# Patient Record
Sex: Female | Born: 1940 | Race: White | Hispanic: No | Marital: Married | Smoking: Never smoker
Health system: Southern US, Community
[De-identification: ages and names within clinical notes are randomized; demographics above are authoritative.]

## PROBLEM LIST (undated history)

## (undated) DIAGNOSIS — K621 Rectal polyp: Secondary | ICD-10-CM

## (undated) DIAGNOSIS — I219 Acute myocardial infarction, unspecified: Secondary | ICD-10-CM

## (undated) DIAGNOSIS — K219 Gastro-esophageal reflux disease without esophagitis: Secondary | ICD-10-CM

## (undated) DIAGNOSIS — I712 Thoracic aortic aneurysm, without rupture: Secondary | ICD-10-CM

## (undated) DIAGNOSIS — R531 Weakness: Secondary | ICD-10-CM

## (undated) DIAGNOSIS — I7121 Aneurysm of the ascending aorta, without rupture: Secondary | ICD-10-CM

## (undated) DIAGNOSIS — R112 Nausea with vomiting, unspecified: Secondary | ICD-10-CM

## (undated) DIAGNOSIS — I2510 Atherosclerotic heart disease of native coronary artery without angina pectoris: Secondary | ICD-10-CM

## (undated) DIAGNOSIS — E785 Hyperlipidemia, unspecified: Secondary | ICD-10-CM

## (undated) DIAGNOSIS — I10 Essential (primary) hypertension: Secondary | ICD-10-CM

## (undated) DIAGNOSIS — I8390 Asymptomatic varicose veins of unspecified lower extremity: Secondary | ICD-10-CM

## (undated) DIAGNOSIS — R5383 Other fatigue: Secondary | ICD-10-CM

## (undated) DIAGNOSIS — Z9889 Other specified postprocedural states: Secondary | ICD-10-CM

## (undated) DIAGNOSIS — J45909 Unspecified asthma, uncomplicated: Secondary | ICD-10-CM

## (undated) HISTORY — DX: Other fatigue: R53.83

## (undated) HISTORY — PX: OTHER SURGICAL HISTORY: SHX169

## (undated) HISTORY — DX: Rectal polyp: K62.1

## (undated) HISTORY — DX: Hyperlipidemia, unspecified: E78.5

## (undated) HISTORY — DX: Atherosclerotic heart disease of native coronary artery without angina pectoris: I25.10

## (undated) HISTORY — PX: CATARACT EXTRACTION: SUR2

## (undated) HISTORY — PX: CORONARY STENT PLACEMENT: SHX1402

## (undated) HISTORY — DX: Thoracic aortic aneurysm, without rupture: I71.2

## (undated) HISTORY — DX: Essential (primary) hypertension: I10

## (undated) HISTORY — DX: Gastro-esophageal reflux disease without esophagitis: K21.9

## (undated) HISTORY — DX: Acute myocardial infarction, unspecified: I21.9

## (undated) HISTORY — PX: SPINE SURGERY: SHX786

## (undated) HISTORY — DX: Aneurysm of the ascending aorta, without rupture: I71.21

## (undated) HISTORY — DX: Asymptomatic varicose veins of unspecified lower extremity: I83.90

## (undated) HISTORY — DX: Weakness: R53.1

## (undated) HISTORY — PX: LEG SURGERY: SHX1003

---

## 1994-02-24 HISTORY — PX: ABDOMINAL HYSTERECTOMY: SHX81

## 1998-02-24 HISTORY — PX: ORIF TIBIA & FIBULA FRACTURES: SHX2131

## 2006-06-05 ENCOUNTER — Encounter: Admission: RE | Admit: 2006-06-05 | Discharge: 2006-06-05 | Payer: Self-pay | Admitting: Unknown Physician Specialty

## 2007-08-03 ENCOUNTER — Encounter: Admission: RE | Admit: 2007-08-03 | Discharge: 2007-08-03 | Payer: Self-pay | Admitting: Unknown Physician Specialty

## 2007-08-10 ENCOUNTER — Encounter: Admission: RE | Admit: 2007-08-10 | Discharge: 2007-08-10 | Payer: Self-pay | Admitting: Unknown Physician Specialty

## 2008-09-07 ENCOUNTER — Encounter: Admission: RE | Admit: 2008-09-07 | Discharge: 2008-09-07 | Payer: Self-pay | Admitting: Unknown Physician Specialty

## 2009-09-17 ENCOUNTER — Encounter: Admission: RE | Admit: 2009-09-17 | Discharge: 2009-09-17 | Payer: Self-pay | Admitting: Obstetrics and Gynecology

## 2009-10-03 ENCOUNTER — Encounter: Admission: RE | Admit: 2009-10-03 | Discharge: 2009-10-03 | Payer: Self-pay | Admitting: Cardiology

## 2009-10-03 ENCOUNTER — Ambulatory Visit: Payer: Self-pay | Admitting: Cardiology

## 2009-10-12 ENCOUNTER — Ambulatory Visit (HOSPITAL_COMMUNITY): Admission: RE | Admit: 2009-10-12 | Discharge: 2009-10-12 | Payer: Self-pay | Admitting: Cardiology

## 2009-10-12 ENCOUNTER — Ambulatory Visit: Payer: Self-pay | Admitting: Cardiology

## 2010-03-14 ENCOUNTER — Ambulatory Visit: Payer: Self-pay | Admitting: Cardiology

## 2010-03-21 ENCOUNTER — Telehealth (INDEPENDENT_AMBULATORY_CARE_PROVIDER_SITE_OTHER): Payer: Self-pay | Admitting: *Deleted

## 2010-03-25 ENCOUNTER — Other Ambulatory Visit: Payer: Self-pay | Admitting: Physician Assistant

## 2010-03-25 ENCOUNTER — Encounter: Payer: Self-pay | Admitting: Cardiology

## 2010-03-25 ENCOUNTER — Ambulatory Visit: Admission: RE | Admit: 2010-03-25 | Discharge: 2010-03-25 | Payer: Self-pay | Source: Home / Self Care

## 2010-03-25 ENCOUNTER — Ambulatory Visit
Admission: RE | Admit: 2010-03-25 | Discharge: 2010-03-25 | Payer: Self-pay | Source: Home / Self Care | Attending: Cardiology | Admitting: Cardiology

## 2010-03-25 ENCOUNTER — Encounter (HOSPITAL_COMMUNITY)
Admission: RE | Admit: 2010-03-25 | Discharge: 2010-03-26 | Payer: Self-pay | Source: Home / Self Care | Attending: Cardiology | Admitting: Cardiology

## 2010-03-25 ENCOUNTER — Encounter: Payer: Self-pay | Admitting: Physician Assistant

## 2010-03-25 DIAGNOSIS — I10 Essential (primary) hypertension: Secondary | ICD-10-CM | POA: Insufficient documentation

## 2010-03-25 DIAGNOSIS — R079 Chest pain, unspecified: Secondary | ICD-10-CM | POA: Insufficient documentation

## 2010-03-25 DIAGNOSIS — J45909 Unspecified asthma, uncomplicated: Secondary | ICD-10-CM | POA: Insufficient documentation

## 2010-03-25 DIAGNOSIS — R9430 Abnormal result of cardiovascular function study, unspecified: Secondary | ICD-10-CM | POA: Insufficient documentation

## 2010-03-25 DIAGNOSIS — E785 Hyperlipidemia, unspecified: Secondary | ICD-10-CM | POA: Insufficient documentation

## 2010-03-25 LAB — PROTIME-INR: Prothrombin Time: 12.3 s (ref 10.2–12.4)

## 2010-03-25 LAB — CBC WITH DIFFERENTIAL/PLATELET
Basophils Absolute: 0 10*3/uL (ref 0.0–0.1)
Basophils Relative: 0.5 % (ref 0.0–3.0)
Eosinophils Absolute: 0.1 10*3/uL (ref 0.0–0.7)
HCT: 40.5 % (ref 36.0–46.0)
Hemoglobin: 13.8 g/dL (ref 12.0–15.0)
MCV: 91.3 fl (ref 78.0–100.0)
Neutrophils Relative %: 62.1 % (ref 43.0–77.0)
RBC: 4.43 Mil/uL (ref 3.87–5.11)
RDW: 13 % (ref 11.5–14.6)
WBC: 5.7 10*3/uL (ref 4.5–10.5)

## 2010-03-25 LAB — BASIC METABOLIC PANEL
BUN: 10 mg/dL (ref 6–23)
Calcium: 9.7 mg/dL (ref 8.4–10.5)
Chloride: 105 mEq/L (ref 96–112)
Creatinine, Ser: 0.6 mg/dL (ref 0.4–1.2)

## 2010-03-26 ENCOUNTER — Encounter: Payer: Self-pay | Admitting: Cardiology

## 2010-03-27 ENCOUNTER — Ambulatory Visit (HOSPITAL_COMMUNITY)
Admission: RE | Admit: 2010-03-27 | Discharge: 2010-03-27 | Disposition: A | Discharge status: Still In Hospital | Payer: Medicare Other | Source: Ambulatory Visit | Attending: Cardiovascular Disease | Admitting: Cardiovascular Disease

## 2010-03-27 ENCOUNTER — Ambulatory Visit (HOSPITAL_COMMUNITY)
Admission: RE | Admit: 2010-03-27 | Discharge: 2010-03-27 | Disposition: A | Discharge status: Nursing Home (Includes ICF) | Payer: Medicare Other | Source: Ambulatory Visit | Attending: Cardiovascular Disease | Admitting: Cardiovascular Disease

## 2010-03-27 ENCOUNTER — Inpatient Hospital Stay (HOSPITAL_COMMUNITY)
Admission: AD | Admit: 2010-03-27 | Discharge: 2010-03-30 | DRG: 247 | Disposition: A | Discharge status: Home / Self Care | Payer: Medicare Other | Source: Ambulatory Visit | Attending: Cardiovascular Disease | Admitting: Cardiovascular Disease

## 2010-03-27 DIAGNOSIS — Z7982 Long term (current) use of aspirin: Secondary | ICD-10-CM

## 2010-03-27 DIAGNOSIS — E7800 Pure hypercholesterolemia, unspecified: Secondary | ICD-10-CM | POA: Insufficient documentation

## 2010-03-27 DIAGNOSIS — J45909 Unspecified asthma, uncomplicated: Secondary | ICD-10-CM | POA: Diagnosis present

## 2010-03-27 DIAGNOSIS — I10 Essential (primary) hypertension: Secondary | ICD-10-CM | POA: Diagnosis present

## 2010-03-27 DIAGNOSIS — I2510 Atherosclerotic heart disease of native coronary artery without angina pectoris: Secondary | ICD-10-CM

## 2010-03-27 DIAGNOSIS — E785 Hyperlipidemia, unspecified: Secondary | ICD-10-CM | POA: Insufficient documentation

## 2010-03-27 DIAGNOSIS — R0602 Shortness of breath: Secondary | ICD-10-CM | POA: Insufficient documentation

## 2010-03-27 DIAGNOSIS — Z7902 Long term (current) use of antithrombotics/antiplatelets: Secondary | ICD-10-CM

## 2010-03-27 DIAGNOSIS — R079 Chest pain, unspecified: Secondary | ICD-10-CM | POA: Insufficient documentation

## 2010-03-27 HISTORY — PX: CORONARY ANGIOPLASTY: SHX604

## 2010-03-28 LAB — CBC
HCT: 42.7 % (ref 36.0–46.0)
MCH: 29.1 pg (ref 26.0–34.0)
MCV: 89.9 fL (ref 78.0–100.0)
Platelets: 283 10*3/uL (ref 150–400)
RBC: 4.75 MIL/uL (ref 3.87–5.11)
WBC: 7.2 10*3/uL (ref 4.0–10.5)

## 2010-03-28 LAB — BASIC METABOLIC PANEL: Creatinine, Ser: 0.65 mg/dL (ref 0.4–1.2)

## 2010-03-28 NOTE — Progress Notes (Addendum)
Summary: Nuclear pre procedure  Phone Note Outgoing Call Call back at Home Phone [phone]   Summary of Call: Reviewed information on Myoview Information Sheet (see scanned document for further details).  Lindsey Middleton spoke with patient.      Nuclear Med Background Indications for Stress Test: Evaluation for Ischemia   History: Asthma, Echo, Heart Catheterization  History Comments: '86 Cath:"minor blockage" per patient; 8/11 Echo:mild LVH  Symptoms: Chest Tightness, Dizziness, SOB    Nuclear Pre-Procedure Cardiac Risk Factors: Family History - CAD

## 2010-03-29 LAB — BASIC METABOLIC PANEL
BUN: 11 mg/dL (ref 6–23)
CO2: 25 mEq/L (ref 19–32)
Calcium: 9.6 mg/dL (ref 8.4–10.5)
GFR calc non Af Amer: >60 mL/min (ref >60)
Glucose, Bld: 113 mg/dL — ABNORMAL HIGH (ref 70–99)
Potassium: 4.4 mEq/L (ref 3.5–5.1)
Sodium: 142 mEq/L (ref 135–145)

## 2010-03-29 LAB — CBC
HCT: 38.7 % (ref 36.0–46.0)
MCHC: 32.3 g/dL (ref 30.0–36.0)
RBC: 4.24 MIL/uL (ref 3.87–5.11)

## 2010-03-30 LAB — BASIC METABOLIC PANEL
BUN: 9 mg/dL (ref 6–23)
CO2: 25 mEq/L (ref 19–32)
Calcium: 9.6 mg/dL (ref 8.4–10.5)
Chloride: 111 mEq/L (ref 96–112)
GFR calc Af Amer: >60 mL/min (ref >60)
GFR calc non Af Amer: >60 mL/min (ref >60)
Glucose, Bld: 111 mg/dL — ABNORMAL HIGH (ref 70–99)
Potassium: 4.2 mEq/L (ref 3.5–5.1)
Sodium: 142 mEq/L (ref 135–145)

## 2010-03-30 LAB — CBC
HCT: 36 % (ref 36.0–46.0)
MCHC: 31.9 g/dL (ref 30.0–36.0)
Platelets: 227 10*3/uL (ref 150–400)
RBC: 3.92 MIL/uL (ref 3.87–5.11)
RDW: 12.6 % (ref 11.5–15.5)
WBC: 5.2 10*3/uL (ref 4.0–10.5)

## 2010-03-30 NOTE — Cardiovascular Report (Addendum)
NAME:  Lindsey Middleton, Lindsey Middleton NO.:  [id number]  MEDICAL RECORD NO.:  [id number]           PATIENT TYPE:  O  LOCATION:  [facility]                         FACILITY:  [facility]  PHYSICIAN:  Verne Carrow, MDDATE OF BIRTH:  15-Oct-1940  DATE OF PROCEDURE:  03/27/2010 DATE OF DISCHARGE:  03/27/2010                           CARDIAC CATHETERIZATION   PRIMARY CARDIOLOGIST:  Cassell Clement, MD  PRIMARY CARE PHYSICIAN:  Ihor Austin. Bufford Buttner, MD at [place], [place]  PROCEDURE PERFORMED: 1. Left heart catheterization. 2. Selective coronary angiography. 3. Left ventricular angiogram.  OPERATOR:  Verne Carrow, MD  INDICATIONS:  This is a 70 year old Caucasian female with a history of hypertension, hyperlipidemia, and nonobstructive coronary artery disease by catheterization in 1986 who has had recent complaints of shortness of breath and chest pain.  She underwent a stress Myoview in the office on March 25, 2010. She complained of chest pain and shortness of breath during the test.  Her images showed apical ischemia with ejection fraction of 63%.  She was seen by Tereso Newcomer, physician's assistant, and the diagnostic catheterization was arranged for today.  DETAILS OF PROCEDURE:  The patient was brought to the outpatient cardiac catheterization laboratory after signing informed consent for the procedure.  The right groin was prepped and draped in sterile fashion. Lidocaine 1% was used for local anesthesia.  A 4-French sheath was inserted into the right femoral artery without difficulty.  Standard diagnostic catheters were used to perform selective coronary angiography.  A pigtail catheter was used to perform a left ventricular angiogram.  The patient tolerated the procedure well.  She was taken to the recovery area with the 4-French sheath in place.  There were plans for the patient to be moved upstairs for percutaneous intervention of the severe stenosis in the  right coronary artery.  The patient was given 4000 units of intravenous heparin.  She was also loaded with Effient 60 mg x1.  HEMODYNAMIC FINDINGS:  Central aortic pressure 131/74.  Left ventricular pressure 129/14.  Left ventricular end-diastolic pressure 17.  ANGIOGRAPHIC FINDINGS: 1. The left main coronary artery had no obstructive disease. 2. 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  Following the     aneurysmal segment, the LAD had a diffuse 40% stenosis throughout     the midsegment.  The distal vessel had mild plaque disease.  There     was a small-caliber diagonal branch with no obstructive disease. 3. 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  There was mild plaque in the proximal     and midsegment.  The first second and third obtuse marginal     branches were small in caliber and no obstructive disease. 4. The right coronary artery was a large dominant vessel with 30%     proximal stenosis and a 95% discrete mid stenosis. 5. Left ventricular angiogram was performed in the RAO projection that     showed normal left ventricular systolic function with ejection     fraction  of 65%.  IMPRESSION: 1. Single-vessel coronary artery disease. 2. Normal left ventricular systolic function.  RECOMMENDATIONS:  The patient will be loaded with 60 mg of Effient x1 now.  We will plan on performing intervention of her right coronary artery with balloon angioplasty and stent placement in the main cath lab later this morning.     Verne Carrow, MD     CM/MEDQ  D:  03/27/2010  T:  03/28/2010  Job:  [id number]  cc:   Cassell Clement, M.D. Ihor Austin. Bufford Buttner, MD  Electronically Signed by Verne Carrow MD on 03/28/2010 12:02:03 PM

## 2010-03-30 NOTE — Cardiovascular Report (Addendum)
NAME:  Lindsey Middleton, Lindsey Middleton NO.:  [id number]  MEDICAL RECORD NO.:  [id number]           PATIENT TYPE:  O  LOCATION:  [facility]                         FACILITY:  [facility]  PHYSICIAN:  Verne Carrow, MDDATE OF BIRTH:  [birth year]  DATE OF PROCEDURE:  03/27/2010 DATE OF DISCHARGE:  03/27/2010                           CARDIAC CATHETERIZATION   PRIMARY CARDIOLOGIST:  Cassell Clement, MD  PRIMARY CARE PHYSICIAN:  Ihor Austin. Bufford Buttner, MD, in [place], [place].  PROCEDURE PERFORMED:  Percutaneous transluminal coronary angioplasty of the mid right coronary artery.  OPERATOR:  Verne Carrow, MD  INDICATION:  This is a 70 year old Caucasian female who has had recent episodes of chest pain and an abnormal Myoview.  She was found during left heart catheterization this morning in the outpatient cath lab to have a severe stenosis in the mid right coronary artery.  Percutaneous coronary intervention was staged for this afternoon.  DETAILS OF PROCEDURE:  When the patient arrived in the main cardiac catheterization laboratory, she had a 4-French sheath present in the right femoral artery.  She was placed flat on the cath table.  The right groin was prepped and draped in sterile fashion.  Additional 1% lidocaine was used around the sheath for local anesthesia.  A 6-French sheath was inserted into the right femoral artery during a catheter exchange with the wire in place.  The patient was given a bolus of Angiomax and a drip was started.  A 6-French JR-4 guiding catheter was used to selectively engage the native right coronary artery.  When ACT was greater than 200, we passed a Cougar intracoronary wire down the length of the right coronary artery.  We initially used a 2.5 x 12-mm balloon to try to predilate the lesion; however, the lesion did not respond.  We then used a series of balloons starting next with a 2.5 x 10-mm cutting balloon.  Next, a 2.75 x 12-mm noncompliant  balloon was used.  The lesion did not respond to any of these inflations.  We were unable to cross the lesion with a 3.0 x 10-mm cutting balloon.  We were able to get a 3.0 x 10 mm AngioSculpt cutting balloon across the lesion and inflated this to 14 atmospheres with no response of the lesion.  The lesion would not yield secondary to calcification and the fibrotic nature of the lesion.  We did not induce any vessel tears or dissections.  I felt that the best plan of action at this time would be to stop and bring her back in several days after vessel healing for Rotablator atherectomy.  The guiding catheter was removed without difficulty.  The patient had no complaints.  There were no complications.  IMPRESSION: 1. Single-vessel coronary artery disease. 2. Unsuccessful percutaneous transluminal coronary angioplasty of the     mid right coronary artery.  RECOMMENDATIONS:  We will continue aspirin, Effient, beta-blocker, and statin.  We will keep the patient in the hospital for 48 hours and we will plan staging the percutaneous coronary intervention of the mid right coronary artery with a Rotablator atherectomy on Friday, March 29, 2010.  Hopefully, we will  be able to alter the calcification on the vessel with a Rotablator and then will be able to place a stent successfully.     Verne Carrow, MD     CM/MEDQ  D:  03/27/2010  T:  03/28/2010  Job:  [id number]  cc:   Cassell Clement, M.D. Ihor Austin. Bufford Buttner, MD  Electronically Signed by Verne Carrow MD on 03/28/2010 12:02:06 PM

## 2010-04-03 NOTE — Miscellaneous (Signed)
Summary: Orders Update  Clinical Lists Changes  Orders: Added new Test order of TLB-BMP (Basic Metabolic Panel-BMET) (80048-METABOL) - Signed Added new Test order of TLB-CBC Platelet - w/Differential (85025-CBCD) - Signed Added new Test order of TLB-PTT (85730-PTTL) - Signed Added new Test order of TLB-PT (Protime) (85610-PTP) - Signed 

## 2010-04-03 NOTE — Miscellaneous (Signed)
Summary: Orders Update  Clinical Lists Changes  Orders: Added new Test order of TLB-BMP (Basic Metabolic Panel-BMET) (80048-METABOL) - Signed Added new Test order of TLB-CBC Platelet - w/Differential (85025-CBCD) - Signed Added new Test order of TLB-PTT (85730-PTTL) - Signed Added new Test order of TLB-PT (Protime) (85610-PTP) - Signed

## 2010-04-03 NOTE — Assessment & Plan Note (Addendum)
Summary: [facility]   Visit Type:  add on from nuc Primary Provider:  Dr. Lorenza Evangelist in [place], [place]  CC:  sob, chest pain, and .  History of Present Illness: Primary Cardiologist:  Dr. Cassell Clement  Lindsey Middleton is a 70 yo female recently evaluated by Dr. Patty Sermons for chest pain.  Ramipril and Toprol were increased for her BP.  She had a stress myoview in the office today and complained of chest pain and shortness of breath during the test.  Her images demonstrated apical ischemia and EF 63%.  She is added on to my schedule for evaluation.  She has had chest discomfort that she describes as a pressure the last couple of months.  It has been worse for the last couple of weeks.  She was to have a stress test back in the summer time, but had to reschedule.  She has chest discomfort with exertion.  It typically gets better with rest.  She notes significant symptoms with vacuuming her house.  She has associated left arm pain, diaphoresis and nausea.  She sleeps on 2 pillows.  This is chronic without change.  She denies PND.  She denies pedal edema.  She states her husband has witnessed her stopping breathing while she sleeps.  She also admits to daytime hypersomnolence.  She is currently not having any chest symptoms at rest.  Prior to today, the last time she had chest discomfort was last week when she overexerted herself.  Her images have not officially been read.  However, they were reviewed by Dr. Antoine Poche.  She has evidence of apical ischemia.  Preventive Screening-Counseling & Management  Alcohol-Tobacco     Smoking Status: never  Current Medications (verified): 1)  Lisinopril 10 Mg Tabs (Lisinopril) .... Once Daily 2)  Metoprolol Succinate 50 Mg Xr24h-Tab (Metoprolol Succinate) .... Take 1 Tablet By Mouth Once A Day 3)  Crestor 40 Mg Tabs (Rosuvastatin Calcium) .... Take 1 Tablet By Mouth Once A Day 4)  Tricor 145 Mg Tabs (Fenofibrate) .... Take 1 Tablet By Mouth Once A  Day 5)  Advair Diskus 100-50 Mcg/dose Aepb (Fluticasone-Salmeterol) .... 2 Puffs Every Morning 6)  Proventil Hfa 108 (90 Base) Mcg/act Aers (Albuterol Sulfate) .... As Needed 7)  Singulair 10 Mg Tabs (Montelukast Sodium) .... Take 1 Tablet By Mouth Once A Day 8)  Allegra Allergy 60 Mg Tabs (Fexofenadine Hcl) .... Once Daily 9)  Aspirin 81 Mg Tbec (Aspirin) .... Take One Tablet By Mouth Daily  Allergies (verified): 1)  ! 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  Alcohol Use - no Smoking Status:  never  Review of Systems       As per  the HPI.  All other systems reviewed and negative.   Vital Signs:  Patient profile:   70 year old female Height:      66 inches Weight:      176 pounds BMI:     28.51 Pulse rate:   84 /  minute BP sitting:   142 / 94  (left arm)  Vitals Entered By: Celestia Khat, CMA (March 25, 2010 1:45 PM)  Physical Exam  General:  Well nourished, well developed in no acute distress HEENT: Normal Neck: No JVD Cardiac:  Normal S1, S2; RRR; no murmur Lungs:  Clear to auscultation bilaterally, no wheezing, rhonchi or rales Abd: Soft, nontender, no hepatomegaly Ext: No clubbing, cyanosis or edema Vascular: No carotid  bruits; Femoral arteries 2+ bilaterally without bruits Skin: Warm and dry MSK:  No deformities Lymph: No adenopathy Endocrine:  No thyromegaly Neuro: CNs 2-12 intact; nonfocal    Impression & Recommendations:  Problem # 1:  CHEST PAIN  UNSPECIFIED (ICD-786.50) The patient requires cardiac catheterization.  Risks and benefits have been discussed with the patient and available family members.  Risks include but are not limited to death, heart attack, stroke, bleeding, infection or allergic reaction from the dye.  The patient is willing to accept these risks and proceed with catheterization. She will continue on ASA and beta blocker.  She has NTG.  She knows to go to the ED if she has unstable symptoms. I discussed her case with Dr. Antoine Poche.  She will be set up in the [facility] lab.  Problem # 2:  NONSPECIFIC ABNORMAL UNSPEC CV FUNCTION STUDY (ICD-794.30) As above.  Orders: EKG w/ Interpretation (93000)  Problem # 3:  HYPERTENSION (ICD-401.9) Continue ACE and beta blocker.  She has not had Toprol yet today.  Problem # 4:  HYPERLIPIDEMIA (ICD-272.4) Followed by PCP.  Problem # 5:  Apnea She should be worked up for sleep apnea after her cardiac workup is complete.

## 2010-04-03 NOTE — Assessment & Plan Note (Signed)
Summary: Cardiology Nuclear Testing  Nuclear Med Background Indications for Stress Test: Evaluation for Ischemia   History: Asthma, Echo, Heart Catheterization  History Comments: '86 Cath:"minor blockage" per patient; 8/11 Echo:mild LVH  Symptoms: Chest Tightness, Dizziness, Palpitations, SOB    Nuclear Pre-Procedure Cardiac Risk Factors: Family History - CAD Caffeine/Decaff Intake: None NPO After: 8:00 PM Lungs: clear IV 0.9% NS with Angio Cath: 20g     IV Site: R Antecubital IV Started by: Irean Hong, RN Chest Size (in) 34     Cup Size D     Height (in): 66 Weight (lb): 178 BMI: 28.83 Tech Comments: Held toprol x 24 hrs.  This patient became very SOB and fatigued when she walked only 3:15 on the treadmill. In recovery, about 10 minutes out she became sob again and also became clammy. BP 142/104.  Nuclear Med Study 1 or 2 day study:  1 day     Stress Test Type:  Stress Reading MD:  Cassell Clement, MD     Referring MD:  T.Brackbill Resting Radionuclide:  Technetium 23m Tetrofosmin     Resting Radionuclide Dose:  11 mCi  Stress Radionuclide:  Technetium 77m Tetrofosmin     Stress Radionuclide Dose:  33 mCi   Stress Protocol Exercise Time (min):  3:15 min     Max HR:  139 bpm     Predicted Max HR:  151 bpm  Max Systolic BP: 145 mm Hg     Percent Max HR:  92.05 %     METS: 4.90 Rate Pressure Product:  04540    Stress Test Technologist:  Milana Na, EMT-P     Nuclear Technologist:  Domenic Polite, CNMT  Rest Procedure  Myocardial perfusion imaging was performed at rest 45 minutes following the intravenous administration of Technetium 82m Tetrofosmin.  Stress Procedure  The patient exercised for 3:15. The patient stopped due to fatigue, sob, and chest tightness.  There were non specific ST-T wave changes and a rare pvcs..  Technetium 31m Tetrofosmin was injected at peak exercise and myocardial perfusion imaging was performed after a brief delay.  QPS Raw Data  Images:  Normal; no motion artifact; normal heart/lung ratio. Stress Images:  Small apical perfusion defect.  Rest Images:  Normal homogeneous uptake in all areas of the myocardium. Subtraction (SDS):  Small reversible apical perfusion defect.  Transient Ischemic Dilatation:  1.0  (Normal <1.22)  Lung/Heart Ratio:  .28  (Normal <0.45)  Quantitative Gated Spect Images QGS EDV:  80 ml QGS ESV:  30 ml QGS EF:  63 % QGS cine images:  Normal wall motion.    Overall Impression  Exercise Capacity: Poor exercise capacity. BP Response: Normal blood pressure response. Clinical Symptoms: Short of breath, chest tightness ECG Impression: No significant ST segment change suggestive of ischemia. Overall Impression: Small reversible perfusion defect at apex suggests possible apical ischemia.  Overall low risk study.  Overall Impression Comments: Poor exercise tolerance.   Appended Document: Cardiology Nuclear Testing COPY SENT TO [facility]

## 2010-04-04 ENCOUNTER — Ambulatory Visit (INDEPENDENT_AMBULATORY_CARE_PROVIDER_SITE_OTHER): Payer: Medicare Other | Admitting: Cardiology

## 2010-04-04 DIAGNOSIS — R42 Dizziness and giddiness: Secondary | ICD-10-CM

## 2010-04-04 DIAGNOSIS — R079 Chest pain, unspecified: Secondary | ICD-10-CM

## 2010-04-06 NOTE — Procedures (Signed)
NAME:  Lindsey Middleton, NABOR NO.:  [id number]  MEDICAL RECORD NO.:  [id number]           PATIENT TYPE:  I  LOCATION:  6529                         FACILITY:  [facility]  PHYSICIAN:  Verne Carrow, MDDATE OF BIRTH:  Jul 20, 1940  DATE OF PROCEDURE:  03/29/2010 DATE OF DISCHARGE:                           CARDIAC CATHETERIZATION   PRIMARY CARDIOLOGIST:  Cassell Clement, MD  PRIMARY CARE PHYSICIAN:  Ihor Austin. Bufford Buttner, MD in [place], [place].  PROCEDURE PERFORMED:  Percutaneous coronary intervention with Rotablator atherectomy of the mid right coronary artery followed by balloon angioplasty and placement of a drug-eluting stent in the mid right coronary artery.  OPERATOR:  Verne Carrow, MD  INDICATIONS:  This is a 70 year old Caucasian female who underwent diagnostic catheterization on March 27, 2010, because of complaints of chest discomfort.  The patient was found to have a 95% stenosis in the mid right coronary artery.  We attempted to open this lesion 2 days ago, however, the lesion would not respond to aggressive balloon angioplasty including a cutting balloon.  I decided to wait 2 days and then bring her back for Rotablator atherectomy.  This procedure was arranged for today.  DETAILS OF PROCEDURE:  The patient was brought to the main cardiac catheterization laboratory after signing informed consent for the procedure.  The right groin was prepped and draped in sterile fashion. A 1% lidocaine was used for local anesthesia.  A 7-French sheath was inserted into the right femoral artery without difficulty.  A 6-French sheath was inserted into the right femoral vein without difficulty.  A balloon-tipped temporary pacemaker was advanced into the right ventricle.  Adequate pacing was demonstrated prior to the case.  The patient was set at 55 beats per minute.  The patient did not require the pacemaker at this time.  Next, she was given a bolus of  Angiomax and drip was started.  She had been previously loaded with Effient 60 mg. She was given 10 mg of Effient this morning.  We then selectively engaged the native right coronary artery with a 7-French JR-4 guiding catheter with side holes.  When the ACT was greater than 200, a Floppy RotaWire was advanced down the right coronary artery.  A 1.75-mm burr was then advanced over the wire.  Rotablator atherectomy was performed at 160,000 revolutions per minute throughout the mid vessel in the area of tightest stenosis.  At this point, the Rotablator burr was removed from the artery.  A 3.0 x 12 mm noncompliant balloon was advanced over the wire and was inflated in the area of tightest stenosis.  The vessel did respond to balloon inflation with this balloon.  A 3.5 x 16 mm Promus Element drug-eluting stent was carefully positioned in the mid vessel and was deployed.  A 3.75 x 12 mm noncompliant balloon was placed inside the stent and was deployed at 14 atmospheres.  There was an excellent angiographic result.  The stenosis was taken from 95% down to 0%.  There was excellent flow into the distal vessel following the stenting procedure.  There were no immediate complications.  IMPRESSION:  Successful PTCA with Rotablator atherectomy  and placement of a single drug-eluting stent in the mid right coronary artery.  RECOMMENDATIONS:  The patient will be continued on aspirin 81 mg once daily, Effient 10 mg once daily, as well as her beta-blocker, ACE inhibitor, and statin therapy.  We will watch her closely tonight and plan discharge tomorrow if she is stable.     Verne Carrow, MD     CM/MEDQ  D:  03/29/2010  T:  03/29/2010  Job:  [id number]  cc:   Cassell Clement, M.D. Ihor Austin. Bufford Buttner, MD  Electronically Signed by Verne Carrow MD on 04/01/2010 09:14:37 AM

## 2010-04-11 NOTE — Discharge Summary (Signed)
  NAME:  Lindsey Middleton, Lindsey Middleton NO.:  [id number]  MEDICAL RECORD NO.:  [id number]           PATIENT TYPE:  I  LOCATION:  6529                         FACILITY:  [facility]  PHYSICIAN:  Luis Abed, MD, FACCDATE OF BIRTH:  Jan 04, 1941  DATE OF ADMISSION:  03/27/2010 DATE OF DISCHARGE:  03/30/2010                              DISCHARGE SUMMARY   PRIMARY CARDIOLOGIST:  Cassell Clement, MD  DISCHARGE DIAGNOSIS:  Coronary artery disease. A.  Status post successful rotational atherectomy/Promus drug-eluting stenting of high-grade mid right coronary artery. B.  Normal left ventricular function.  SECONDARY DIAGNOSES: 1. Hypertension. 2. Dyslipidemia. 3. Asthma.  REASON FOR ADMISSION:  Lindsey Middleton is a 70 year old female, with remote history of nonobstructive coronary artery disease, who underwent recent evaluation for chest pain with an exercise stress Myoview.  During the procedure, she developed chest pain/dyspnea, and the images were suggestive of ischemia.  Arrangements were made for elective cardiac catheterization.  HOSPITAL COURSE:  The patient was found to have severe single-vessel coronary artery disease, as outlined above, with normal left ventricular function.  She was loaded with Effient, with plans to proceed with staged  percutaneous intervention.  She returned to the lab 48 hours later, and  underwent successful percutaneous intervention, by Dr. Clifton James, with  initial rotational atherectomy, followed by Promus drug-eluting stenting  of the high-grade mid right coronary artery lesion.  There were no noted  complications.  The patient was kept for overnight observation, and cleared  for discharge the following day, in hemodynamically stable condition.  DISCHARGE LABORATORY DATA:  WBC 5.2, hemoglobin 11.5, hematocrit 36, platelet 227.  Sodium 142, potassium 4.2, BUN 9, creatinine 0.6, and  glucose 111.  DISPOSITION:  Stable.  FOLLOWUP:  Dr. Cassell Clement  in 2 weeks, arrangements to be made through our office.  DISCHARGE MEDICATIONS: 1. Coated aspirin 325 mg daily. 2. Effient 10 mg daily. 3. Advair 2 puffs daily. 4. Crestor 40 mg daily. 5. Fexofenadine 60 mg daily. 6. Lisinopril 10 mg daily. 7. Toprol-XL 50 mg daily. 8. Proventil 1 puff q.i.d. p.r.n. 9. Singulair 10 mg daily. 10.TriCor 145 mg daily. 11.Nitrostat 0.4 mg p.r.n.  DURATION OF DISCHARGE ENCOUNTER:  Greater than 30 minutes, including physician time.     Gene Serpe, PA-C   ______________________________ Luis Abed, MD, [facility]    GS/MEDQ  D:  03/30/2010  T:  03/30/2010  Job:  [id number]  cc:   Ihor Austin. Bufford Buttner, MD  Electronically Signed by Rozell Searing PA-C on 04/02/2010 05:02:11 PM Electronically Signed by Willa Rough MD FACC on 04/11/2010 12:42:45 PM

## 2010-04-22 ENCOUNTER — Ambulatory Visit (INDEPENDENT_AMBULATORY_CARE_PROVIDER_SITE_OTHER): Payer: Medicare Other | Admitting: Cardiology

## 2010-04-22 DIAGNOSIS — Z79899 Other long term (current) drug therapy: Secondary | ICD-10-CM

## 2010-04-22 DIAGNOSIS — E7800 Pure hypercholesterolemia, unspecified: Secondary | ICD-10-CM

## 2010-04-22 DIAGNOSIS — I119 Hypertensive heart disease without heart failure: Secondary | ICD-10-CM

## 2010-04-22 DIAGNOSIS — I2510 Atherosclerotic heart disease of native coronary artery without angina pectoris: Secondary | ICD-10-CM

## 2010-04-23 ENCOUNTER — Ambulatory Visit: Payer: Medicare Other | Admitting: Cardiology

## 2010-04-23 NOTE — Cardiovascular Report (Addendum)
Summary: [facility]: CDC Pre-Cath Orders  [facility]: CDC Pre-Cath Orders   Imported By: Earl Many 04/19/2010 09:22:50  _____________________________________________________________________  External Attachment:    Type:   Image     Comment:   External Document

## 2010-06-03 ENCOUNTER — Telehealth: Payer: Self-pay | Admitting: Cardiology

## 2010-06-03 NOTE — Telephone Encounter (Signed)
Patient advised she has appt tomorrow at 9:45am

## 2010-06-03 NOTE — Telephone Encounter (Signed)
Lindsey Middleton reports that patient experienced chest pain during cardiac rehab on Friday.  She is faxing ekg.

## 2010-06-04 ENCOUNTER — Encounter: Payer: Self-pay | Admitting: Cardiology

## 2010-06-04 ENCOUNTER — Ambulatory Visit (INDEPENDENT_AMBULATORY_CARE_PROVIDER_SITE_OTHER): Payer: Medicare Other | Admitting: Cardiology

## 2010-06-04 DIAGNOSIS — I2510 Atherosclerotic heart disease of native coronary artery without angina pectoris: Secondary | ICD-10-CM | POA: Insufficient documentation

## 2010-06-04 DIAGNOSIS — E785 Hyperlipidemia, unspecified: Secondary | ICD-10-CM | POA: Insufficient documentation

## 2010-06-04 DIAGNOSIS — R531 Weakness: Secondary | ICD-10-CM | POA: Insufficient documentation

## 2010-06-04 DIAGNOSIS — I10 Essential (primary) hypertension: Secondary | ICD-10-CM | POA: Insufficient documentation

## 2010-06-04 DIAGNOSIS — R5381 Other malaise: Secondary | ICD-10-CM | POA: Insufficient documentation

## 2010-06-04 DIAGNOSIS — R5383 Other fatigue: Secondary | ICD-10-CM | POA: Insufficient documentation

## 2010-06-04 DIAGNOSIS — R079 Chest pain, unspecified: Secondary | ICD-10-CM

## 2010-06-04 DIAGNOSIS — K219 Gastro-esophageal reflux disease without esophagitis: Secondary | ICD-10-CM | POA: Insufficient documentation

## 2010-06-04 DIAGNOSIS — I259 Chronic ischemic heart disease, unspecified: Secondary | ICD-10-CM | POA: Insufficient documentation

## 2010-06-04 DIAGNOSIS — E7800 Pure hypercholesterolemia, unspecified: Secondary | ICD-10-CM | POA: Insufficient documentation

## 2010-06-04 MED ORDER — ISOSORBIDE MONONITRATE ER 30 MG PO TB24: 30.0000 mg | ORAL_TABLET | Freq: Every day | ORAL | Status: DC

## 2010-06-04 NOTE — Assessment & Plan Note (Signed)
Her blood pressure at times at the cardiac rehabilitation program has been elevated.  She is no longer taking ramipril.  She is also having some intermittent chest discomfort and we will start her on Imdur 30 mg each morning to help with blood pressure and with exertional chest pressure.

## 2010-06-04 NOTE — Assessment & Plan Note (Signed)
The patient had chest pain at the rehabilitation program on 05/31/10 and EKG at that time was normal.  We repeated her electrocardiogram today and it remains normal with nonspecific anterior wall T-wave inversion in V1 through V3.

## 2010-06-04 NOTE — Progress Notes (Signed)
History of Present Illness: This 70 year old woman with known and documented coronary artery disease is seen for a scheduled followup visit.  She had Rotablator therapy of a severe stenosis of the mid right coronary artery on 03/29/10.  Her other 2 arteries showed only mild disease.  She has been participating in the cardiac rehabilitation program and [place].  She also return to work on February 13.  She has said some mild chest pressure.  She has not been having to take nitroglycerin.  In addition she has a history of malaise and fatigue, essential hypertension, GERD, and dyslipidemia.  Current Outpatient Prescriptions  Medication Sig Dispense Refill  . aspirin 325 MG tablet Take 325 mg by mouth daily.        . Cholecalciferol (VITAMIN D PO) Take by mouth every 14 (fourteen) days.        . Cyanocobalamin (B-12 PO) Take by mouth daily.        . Fenofibrate (TRICOR PO) Take by mouth daily.        . fexofenadine (ALLEGRA) 180 MG tablet Take 180 mg by mouth daily.        . Fluticasone-Salmeterol (ADVAIR DISKUS) 250-50 MCG/DOSE AEPB Inhale 2 puffs into the lungs daily.        . metoprolol (TOPROL-XL) 100 MG 24 hr tablet Take 100 mg by mouth daily.        . montelukast (SINGULAIR) 10 MG tablet Take 10 mg by mouth at bedtime.        . Multiple Vitamin (MULTIVITAMIN) tablet Take 1 tablet by mouth daily.        . nitroGLYCERIN (NITROSTAT) 0.4 MG SL tablet Place 0.4 mg under the tongue every 5 (five) minutes as needed.        . prasugrel (EFFIENT) 10 MG TABS Take 10 mg by mouth daily.        . rosuvastatin (CRESTOR) 20 MG tablet Take 20 mg by mouth daily.        Marland Kitchen esomeprazole (NEXIUM) 40 MG capsule Take 40 mg by mouth as needed.        . isosorbide mononitrate (IMDUR) 30 MG 24 hr tablet Take 1 tablet (30 mg total) by mouth daily.  30 tablet  11  . ramipril (ALTACE) 10 MG tablet Take 10 mg by mouth daily.          Allergies  Allergen Reactions  . Morphine     REACTION: Anaphlactic shock     Patient Active Problem List  Diagnoses  . HYPERLIPIDEMIA  . HYPERTENSION  . ASTHMA  . CHEST PAIN UNSPECIFIED  . NONSPECIFIC ABNORMAL UNSPEC CV FUNCTION STUDY  . Coronary artery disease  . Chest pain  . Weakness  . Fatigue  . GERD (gastroesophageal reflux disease)  . Dyslipidemia  . Hypertension  . Malaise  . Ischemic heart disease    History  Smoking status  . Never Smoker   Smokeless tobacco  . Not on file    History  Alcohol Use No    Family History  Problem Relation Age of Onset  . Heart attack Mother   . Heart attack Father   . Heart attack Sister   . Heart attack Brother   . Heart attack Brother   . Heart attack Brother     Review of Systems: Constitutional: no fever chills diaphoresis or fatigue or change in weight.  Head and neck: no hearing loss, no epistaxis, no photophobia or visual disturbance. Respiratory: No cough, shortness of breath  or wheezing. Cardiovascular: No chest pain peripheral edema, palpitations. Gastrointestinal: No abdominal distention, no abdominal pain, no change in bowel habits hematochezia or melena. Genitourinary: No dysuria, no frequency, no urgency, no nocturia. Musculoskeletal:No arthralgias, no back pain, no gait disturbance or myalgias. Neurological: No dizziness, no headaches, no numbness, no seizures, no syncope, no weakness, no tremors. Hematologic: No lymphadenopathy, no easy bruising. Psychiatric: No confusion, no hallucinations, no sleep disturbance.    Physical Exam: Filed Vitals:   06/04/10 1016  BP: 140/90  Pulse: 61  Weight is 177 poundsPupils equal and reactive.   Extraocular Movements are full.  There is no scleral icterus.  The mouth and pharynx are normal.  The neck is supple.  The carotids reveal no bruits.  The jugular venous pressure is normal.  The thyroid is not enlarged.  There is no lymphadenopathy.The chest is clear to percussion and auscultation. There are no rales or rhonchi. Expansion of the  chest is symmetrical.The precordium is quiet.  The first heart sound is normal.  The second heart sound is physiologically split.  There is no murmur gallop rub or click.  There is no abnormal lift or heave.The abdomen is soft and nontender. Bowel sounds are normal. The liver and spleen are not enlarged. There Are no abdominal masses. There are no bruits.The pedal pulses are good.  There is no phlebitis or edema.  There is no cyanosis or clubbing.Strength is normal and symmetrical in all extremities.  There is no lateralizing weakness.  There are no sensory deficits.   Assessment / Plan: Her electrocardiogram today shows nonspecific T-wave abnormalities.  It is essentially unchanged from prior tracings.  We are adding Imdur  30 mg each morning to her regimen.Recheck in 3 months or sooner p.r.n.  If blood pressure continues to have high spikes she may also need to try restarting her ramipril.

## 2010-06-04 NOTE — Assessment & Plan Note (Signed)
The patient has a past history of single vessel coronary artery disease and had successful Rotablator therapy of the tight lesion in the mid right coronary artery on 03/29/10.  Since that time she has continued to experience some intermittent chest pressure.  She has been participating in the rehabilitation program in [place].

## 2010-06-13 ENCOUNTER — Encounter: Payer: Self-pay | Admitting: Cardiology

## 2010-10-15 ENCOUNTER — Ambulatory Visit: Payer: Medicare Other | Admitting: Cardiology

## 2010-11-05 ENCOUNTER — Telehealth: Payer: Self-pay | Admitting: Cardiology

## 2010-11-05 NOTE — Telephone Encounter (Signed)
Somehow patient was back on Ramipril 5 mg instead of 10 mg and has been for the last 4 months per pharmacist.  According to pharmacist, patient at orthopedic yesterday and blood pressure was very high.  Pharmacist is going to fill Rx for 10 mg.  Left message at patients home about checking blood pressure at home.  Next OV here in November.

## 2010-11-05 NOTE — Telephone Encounter (Signed)
Clarification on current dosage/ direction on rampiril.

## 2010-11-05 NOTE — Telephone Encounter (Signed)
Clarification on current dosage/ direction on ramipril.

## 2010-11-07 NOTE — Telephone Encounter (Signed)
Patient never returned call  

## 2010-12-02 ENCOUNTER — Encounter: Payer: Self-pay | Admitting: Nurse Practitioner

## 2010-12-02 ENCOUNTER — Ambulatory Visit (INDEPENDENT_AMBULATORY_CARE_PROVIDER_SITE_OTHER): Payer: Medicare Other | Admitting: Nurse Practitioner

## 2010-12-02 ENCOUNTER — Encounter (INDEPENDENT_AMBULATORY_CARE_PROVIDER_SITE_OTHER): Payer: Medicare Other | Admitting: *Deleted

## 2010-12-02 VITALS — BP 128/104 | HR 93 | Ht 65.0 in | Wt 177.0 lb

## 2010-12-02 DIAGNOSIS — I10 Essential (primary) hypertension: Secondary | ICD-10-CM

## 2010-12-02 DIAGNOSIS — M79609 Pain in unspecified limb: Secondary | ICD-10-CM

## 2010-12-02 DIAGNOSIS — R229 Localized swelling, mass and lump, unspecified: Secondary | ICD-10-CM

## 2010-12-02 DIAGNOSIS — R609 Edema, unspecified: Secondary | ICD-10-CM

## 2010-12-02 DIAGNOSIS — I2510 Atherosclerotic heart disease of native coronary artery without angina pectoris: Secondary | ICD-10-CM

## 2010-12-02 MED ORDER — AMLODIPINE BESYLATE 5 MG PO TABS: 5.0000 mg | ORAL_TABLET | Freq: Every day | ORAL | Status: DC

## 2010-12-02 MED ORDER — METOPROLOL SUCCINATE ER 100 MG PO TB24: ORAL_TABLET | ORAL | Status: DC

## 2010-12-02 NOTE — Assessment & Plan Note (Signed)
Left leg is bigger than the right. She does have some calf discomfort. Will check doppler today to rule out DVT.

## 2010-12-02 NOTE — Patient Instructions (Signed)
We are going to start you on Norvasc (Amlodipine) 5 mg daily.  Increase the metoprolol to a whole tablet in the morning and a half of a tablet at night We are going to get a doppler of your leg.  I will see you in a month Monitor your blood pressure at home. Goal is less than 140/90.  Restrict your salt.

## 2010-12-02 NOTE — Progress Notes (Signed)
Lindsey Middleton Date of Birth: 09/24/1940   History of Present Illness: Lindsey Middleton is seen today for a work in visit. She is seen for Lindsey Middleton. Her blood pressure has been up for at least the last few weeks. She has had a little chest pain. No NTG use. She has been a little dizzy. She had been over at the orthopedic's office. They were discussing removing screws from her left leg. She had these placed about 12 years ago due to fracture and they are now working their way out. Blood pressure there was up. She was also having more swelling in the left leg with some discomfort. She was worried about DVT. She is not on coumadin but is on Effient. Salt use is somewhat questionable.   Current Outpatient Prescriptions on File Prior to Visit  Medication Sig Dispense Refill  . aspirin 325 MG tablet Take 325 mg by mouth daily.        . Cholecalciferol (VITAMIN D PO) Take by mouth every 14 (fourteen) days.        Marland Kitchen esomeprazole (NEXIUM) 40 MG capsule Take 40 mg by mouth as needed.        . Fenofibrate (TRICOR PO) Take by mouth daily.        . fexofenadine (ALLEGRA) 180 MG tablet Take 180 mg by mouth daily.        . Fluticasone-Salmeterol (ADVAIR DISKUS) 250-50 MCG/DOSE AEPB Inhale 2 puffs into the lungs daily.        . isosorbide mononitrate (IMDUR) 30 MG 24 hr tablet Take 1 tablet (30 mg total) by mouth daily.  30 tablet  11  . montelukast (SINGULAIR) 10 MG tablet Take 10 mg by mouth at bedtime.        . Multiple Vitamin (MULTIVITAMIN) tablet Take 1 tablet by mouth daily.        . nitroGLYCERIN (NITROSTAT) 0.4 MG SL tablet Place 0.4 mg under the tongue every 5 (five) minutes as needed.        . prasugrel (EFFIENT) 10 MG TABS Take 10 mg by mouth daily.        . ramipril (ALTACE) 10 MG tablet Take 10 mg by mouth daily.        . rosuvastatin (CRESTOR) 20 MG tablet Take 20 mg by mouth daily.        Marland Kitchen DISCONTD: metoprolol (TOPROL-XL) 100 MG 24 hr tablet Take 100 mg by mouth daily.        Marland Kitchen amLODipine  (NORVASC) 5 MG tablet Take 1 tablet (5 mg total) by mouth daily.  30 tablet  11    Allergies  Allergen Reactions  . Morphine     REACTION: Anaphlactic shock    Past Medical History  Diagnosis Date  . Coronary artery disease     s/p Rotablator to mid RCA in 2/12  . Weakness   . Fatigue   . GERD (gastroesophageal reflux disease)   . Dyslipidemia   . Hypertension   . Varicose vein     Past Surgical History  Procedure Date  . Coronary angioplasty 2/12    Rotablator to the RCA    History  Smoking status  . Never Smoker   Smokeless tobacco  . Not on file    History  Alcohol Use No    Family History  Problem Relation Age of Onset  . Heart attack Mother   . Heart attack Father   . Heart attack Sister   . Heart attack  Brother   . Heart attack Brother   . Heart attack Brother     Review of Systems: The review of systems is positive for swelling and pain in the left leg.  Blood pressure has been grossly elevated at home. Salt use is questionable. Last meal eaten out was at [employer]. All other systems were reviewed and are negative.  Physical Exam: BP 128/104  Pulse 93  Ht 5\' 5"  (1.651 m)  Wt 177 lb (80.287 kg)  BMI 29.45 kg/m2 Patient is very pleasant and in no acute distress. Skin is warm and dry. Color is normal.  HEENT is unremarkable. Normocephalic/atraumatic. PERRL. Sclera are nonicteric. Neck is supple. No masses. No JVD. Lungs are clear. Cardiac exam shows a regular rate and rhythm. Abdomen is soft. Extremities are with 1+ edema on the left. She has multiple varicosities. Gait and ROM are intact. No gross neurologic deficits noted.   LABORATORY DATA: EKG shows sinus with anterolateral T wave changes. Unchanged from prior tracings.    Assessment / Plan:

## 2010-12-02 NOTE — Assessment & Plan Note (Signed)
Blood pressure is up today. I have added Norvasc 5 mg daily and increased her Toprol to 100 mg in the am and 50 mg in the evening. I will see her back in 1 week. She is to monitor her blood pressure at home. Needs to restrict salt. Patient is agreeable to this plan and will call if any problems develop in the interim.

## 2010-12-02 NOTE — Assessment & Plan Note (Signed)
She has had prior Rotablator back in February. She remains on her Effient. Norvasc is added. I suspect most of her symptoms are due to her blood pressure being elevated. If symptoms persist, we will need to do further testing.

## 2010-12-05 ENCOUNTER — Telehealth: Payer: Self-pay | Admitting: *Deleted

## 2010-12-05 ENCOUNTER — Encounter: Payer: Self-pay | Admitting: Cardiology

## 2010-12-05 NOTE — Progress Notes (Signed)
Advised of results

## 2010-12-05 NOTE — Telephone Encounter (Signed)
Advised of results

## 2010-12-05 NOTE — Telephone Encounter (Signed)
Message copied by Burnell Blanks on Thu Dec 05, 2010  5:37 PM ------      Message from: Cassell Clement      Created: Thu Dec 05, 2010 12:22 PM       Please report.  The venous Doppler does not show any sign of deep vein thrombosis of the leg

## 2010-12-09 ENCOUNTER — Ambulatory Visit: Payer: Medicare Other | Admitting: Nurse Practitioner

## 2010-12-13 ENCOUNTER — Ambulatory Visit (INDEPENDENT_AMBULATORY_CARE_PROVIDER_SITE_OTHER): Payer: Medicare Other | Admitting: Nurse Practitioner

## 2010-12-13 ENCOUNTER — Encounter: Payer: Self-pay | Admitting: Nurse Practitioner

## 2010-12-13 VITALS — BP 124/90 | HR 84 | Ht 66.0 in | Wt 178.1 lb

## 2010-12-13 DIAGNOSIS — I10 Essential (primary) hypertension: Secondary | ICD-10-CM

## 2010-12-13 DIAGNOSIS — I2510 Atherosclerotic heart disease of native coronary artery without angina pectoris: Secondary | ICD-10-CM

## 2010-12-13 DIAGNOSIS — R609 Edema, unspecified: Secondary | ICD-10-CM

## 2010-12-13 MED ORDER — AMLODIPINE BESYLATE 5 MG PO TABS: 5.0000 mg | ORAL_TABLET | Freq: Two times a day (BID) | ORAL | Status: DC

## 2010-12-13 NOTE — Patient Instructions (Addendum)
Continue with your current medicines except I want you to increase the Norvasc (amlodipine) to two times a day - morning and night Monitor your blood pressure at home.  Record your readings and bring to your next visit. Limit sodium intake. Call for any problems.

## 2010-12-13 NOTE — Assessment & Plan Note (Signed)
Blood pressure is coming down but still not at goal. Will increase the Norvasc to 5 mg BID. Hopefully this will not aggravate her swelling. She has an appointment to see Dr. Patty Sermons in November. She will continue to monitor at home. Patient is agreeable to this plan and will call if any problems develop in the interim.

## 2010-12-13 NOTE — Progress Notes (Signed)
[name] Date of Birth: 12-Feb-1941 Medical Record #[id number]  History of Present Illness: Ms. Cathell is seen back today for a 10 day check. She is seen for Dr. Patty Sermons. When I last saw her, she had been to the ortho office. They were discussing removing screws from her left leg. She had these placed about 12 years ago following fracture and now they are working their way out. Blood pressure was up there and here.  Norvasc 5 mg added and her metoprolol increased for her elevated blood pressure. She has had a negative doppler of her left leg that was obtained for swelling. She is doing well. Blood pressure is slowly coming down. She has less chest tightness. Overall she feels better.   Current Outpatient Prescriptions on File Prior to Visit  Medication Sig Dispense Refill  . amLODipine (NORVASC) 5 MG tablet Take 1 tablet (5 mg total) by mouth daily.  30 tablet  11  . aspirin 325 MG tablet Take 325 mg by mouth daily.        . Cholecalciferol (VITAMIN D PO) Take by mouth every 14 (fourteen) days.        . Fenofibrate (TRICOR PO) Take by mouth daily.        . fexofenadine (ALLEGRA) 180 MG tablet Take 180 mg by mouth daily.        . Fluticasone-Salmeterol (ADVAIR DISKUS) 250-50 MCG/DOSE AEPB Inhale 2 puffs into the lungs daily.        . isosorbide mononitrate (IMDUR) 30 MG 24 hr tablet Take 1 tablet (30 mg total) by mouth daily.  30 tablet  11  . metoprolol (TOPROL-XL) 100 MG 24 hr tablet Take whole tablet in the morning and a half of a tablet at night.  45 tablet  6  . montelukast (SINGULAIR) 10 MG tablet Take 10 mg by mouth at bedtime.        . nitroGLYCERIN (NITROSTAT) 0.4 MG SL tablet Place 0.4 mg under the tongue every 5 (five) minutes as needed.        . prasugrel (EFFIENT) 10 MG TABS Take 10 mg by mouth daily.        . ramipril (ALTACE) 10 MG tablet Take 10 mg by mouth daily.        . rosuvastatin (CRESTOR) 20 MG tablet Take 20 mg by mouth daily.          Allergies  Allergen  Reactions  . Morphine     REACTION: Anaphlactic shock    Past Medical History  Diagnosis Date  . Coronary artery disease     s/p Rotablator to mid RCA in 2/12  . Weakness   . Fatigue   . GERD (gastroesophageal reflux disease)   . Dyslipidemia   . Hypertension   . Varicose vein     Past Surgical History  Procedure Date  . Coronary angioplasty 2/12    Rotablator to the RCA    History  Smoking status  . Never Smoker   Smokeless tobacco  . Not on file    History  Alcohol Use No    Family History  Problem Relation Age of Onset  . Heart attack Mother   . Heart attack Father   . Heart attack Sister   . Heart attack Brother   . Heart attack Brother   . Heart attack Brother     Review of Systems: The review of systems is positive for chronic swelling of the left leg from prior injury.  All other systems were reviewed and are negative.  Physical Exam: Ht 5\' 6"  (1.676 m)  Wt 178 lb 1.9 oz (80.795 kg)  BMI 28.75 kg/m2 Repeat blood pressure by me is 124/94. Patient is very pleasant and in no acute distress. Skin is warm and dry. Color is normal.  HEENT is unremarkable. Normocephalic/atraumatic. PERRL. Sclera are nonicteric. Neck is supple. No masses. No JVD. Lungs are clear. Cardiac exam shows a regular rate and rhythm. Abdomen is soft. Extremities show trace edema on the left. Gait and ROM are intact. No gross neurologic deficits noted.  LABORATORY DATA:   Assessment / Plan:

## 2010-12-13 NOTE — Assessment & Plan Note (Signed)
She has had prior Rotablator back in February. She remains on Effient. Norvasc has been increased for her blood pressure. Her symptoms have improved with reduction of her blood pressure. We will reassess on her return visit. Further testing may be needed.

## 2010-12-13 NOTE — Assessment & Plan Note (Signed)
Doppler of the left leg was negative. Support stockings are encouraged. Hopefully the Norvasc will not aggravate.

## 2010-12-22 NOTE — Progress Notes (Signed)
Agree with plan 

## 2010-12-30 ENCOUNTER — Encounter: Payer: Self-pay | Admitting: Cardiology

## 2010-12-30 ENCOUNTER — Ambulatory Visit (INDEPENDENT_AMBULATORY_CARE_PROVIDER_SITE_OTHER): Payer: Medicare Other | Admitting: Cardiology

## 2010-12-30 VITALS — BP 128/88 | HR 78 | Ht 66.0 in | Wt 181.0 lb

## 2010-12-30 DIAGNOSIS — I10 Essential (primary) hypertension: Secondary | ICD-10-CM

## 2010-12-30 DIAGNOSIS — J45909 Unspecified asthma, uncomplicated: Secondary | ICD-10-CM

## 2010-12-30 DIAGNOSIS — I259 Chronic ischemic heart disease, unspecified: Secondary | ICD-10-CM

## 2010-12-30 DIAGNOSIS — I2510 Atherosclerotic heart disease of native coronary artery without angina pectoris: Secondary | ICD-10-CM

## 2010-12-30 DIAGNOSIS — I119 Hypertensive heart disease without heart failure: Secondary | ICD-10-CM

## 2010-12-30 NOTE — Progress Notes (Signed)
Lindsey Middleton Date of Birth:  12-21-1940 [facility] [address]   [address] [place], [place]  [address] [phone]           Fax   [phone]  History of Present Illness: This pleasant 70 year old woman is seen for a scheduled four-month followup office visit.  She has a history of essential hypertension and a history of coronary artery disease.  She had a Rotablator to her mid right coronary artery in February of 2012.  She also has a history of dyslipidemia and GERD.  His last visit she has been feeling well.  Current Outpatient Prescriptions  Medication Sig Dispense Refill  . amLODipine (NORVASC) 5 MG tablet Take 1 tablet (5 mg total) by mouth 2 (two) times daily.  30 tablet  11  . aspirin 325 MG tablet Take 325 mg by mouth daily.        . Fenofibrate (TRICOR PO) Take by mouth daily.        . fexofenadine (ALLEGRA) 180 MG tablet Take 180 mg by mouth daily.        . Fluticasone-Salmeterol (ADVAIR DISKUS) 250-50 MCG/DOSE AEPB Inhale 2 puffs into the lungs daily.        . isosorbide mononitrate (IMDUR) 30 MG 24 hr tablet Take 1 tablet (30 mg total) by mouth daily.  30 tablet  11  . metoprolol (TOPROL-XL) 100 MG 24 hr tablet Take whole tablet in the morning and a half of a tablet at night.  45 tablet  6  . montelukast (SINGULAIR) 10 MG tablet Take 10 mg by mouth at bedtime.        . nitroGLYCERIN (NITROSTAT) 0.4 MG SL tablet Place 0.4 mg under the tongue every 5 (five) minutes as needed.        Marland Kitchen omeprazole (PRILOSEC OTC) 20 MG tablet Take 20 mg by mouth as needed.        . prasugrel (EFFIENT) 10 MG TABS Take 10 mg by mouth daily.        . ramipril (ALTACE) 10 MG tablet Take 10 mg by mouth daily.        . rosuvastatin (CRESTOR) 20 MG tablet Take 20 mg by mouth daily.        . Cholecalciferol (VITAMIN D PO) Take by mouth every 14 (fourteen) days.          Allergies  Allergen Reactions  . Morphine     REACTION: Anaphlactic shock    Patient Active  Problem List  Diagnoses  . HYPERLIPIDEMIA  . HYPERTENSION  . ASTHMA  . CHEST PAIN UNSPECIFIED  . NONSPECIFIC ABNORMAL UNSPEC CV FUNCTION STUDY  . Coronary artery disease  . Chest pain  . Weakness  . Fatigue  . GERD (gastroesophageal reflux disease)  . Dyslipidemia  . Hypertension  . Malaise  . Ischemic heart disease  . Edema    History  Smoking status  . Never Smoker   Smokeless tobacco  . Not on file    History  Alcohol Use No    Family History  Problem Relation Age of Onset  . Heart attack Mother   . Heart attack Father   . Heart attack Sister   . Heart attack Brother   . Heart attack Brother   . Heart attack Brother     Review of Systems: Constitutional: no fever chills diaphoresis or fatigue or change in weight.  Head and neck: no hearing loss, no epistaxis, no photophobia or visual disturbance.  Respiratory: No cough, shortness of breath or wheezing. Cardiovascular: No chest pain peripheral edema, palpitations. Gastrointestinal: No abdominal distention, no abdominal pain, no change in bowel habits hematochezia or melena. Genitourinary: No dysuria, no frequency, no urgency, no nocturia. Musculoskeletal:No arthralgias, no back pain, no gait disturbance or myalgias. Neurological: No dizziness, no headaches, no numbness, no seizures, no syncope, no weakness, no tremors. Hematologic: No lymphadenopathy, no easy bruising. Psychiatric: No confusion, no hallucinations, no sleep disturbance.    Physical Exam: Filed Vitals:   12/30/10 1422  BP: 128/88  Pulse: 78   Gen. appearance reveals a well-developed well-nourished woman in no distress.Pupils equal and reactive.   Extraocular Movements are full.  There is no scleral icterus.  The mouth and pharynx are normal.  The neck is supple.  The carotids reveal no bruits.  The jugular venous pressure is normal.  The thyroid is not enlarged.  There is no lymphadenopathy.  The chest is clear to percussion and  auscultation. There are no rales or rhonchi. Expansion of the chest is symmetrical.  The precordium is quiet.  The first heart sound is normal.  The second heart sound is physiologically split.  There is no murmur gallop rub or click.  There is no abnormal lift or heave.  The abdomen is soft and nontender. Bowel sounds are normal. The liver and spleen are not enlarged. There Are no abdominal masses. There are no bruits.  Extremities reveal mild peripheral edema.  Pulses are goodStrength is normal and symmetrical in all extremities.  There is no lateralizing weakness.  There are no sensory deficit. The skin is warm and dry.  There is no rash.   Assessment / Plan: Continue same medication.  For a nonproductive cough she will use Mucinex plain 600 mg twice a day.  Recheck in 4 months for a followup office visit and EKG

## 2010-12-30 NOTE — Patient Instructions (Signed)
Add plain OTC Mucinex 600 mg twice daily   Your physician recommends that you schedule a follow-up appointment in: 4 months with EKG

## 2010-12-30 NOTE — Assessment & Plan Note (Signed)
The patient had had continued problems with labile blood pressure.  However on her last visit here her amlodipine was switched to 5 mg twice a day and since then the patient has done well with stable blood pressures.  She has had only mild ankle edema which she is tolerating okay

## 2010-12-30 NOTE — Assessment & Plan Note (Signed)
The patient has a history of documented ischemic heart disease.  He has had chest pain since 1986.  In 1986 she was referred to [facility] where she underwent cardiac catheterization and was told that she had only minor degrees of blockage but did not require any intervention.  He began seeing her in August of 2000 111.  The patient had a treadmill Cardiolite stress test in January of 2012 which noted a small reversible perfusion defect at the apex suggesting possible apical ischemia but overall was felt to be a low risk study.  The patient continued to have more pain in head eventual cardiac catheterization on 03/29/10 at which time she had successful PTCA with Rotablator atherectomy and placement of a single drug-eluting stent in the mid right coronary artery.  She has not been experiencing any subsequent angina pectoris.

## 2011-02-05 ENCOUNTER — Other Ambulatory Visit: Payer: Self-pay | Admitting: Orthopedic Surgery

## 2011-02-19 ENCOUNTER — Encounter (HOSPITAL_BASED_OUTPATIENT_CLINIC_OR_DEPARTMENT_OTHER): Payer: Self-pay | Admitting: *Deleted

## 2011-02-19 NOTE — Progress Notes (Addendum)
To [facility] at 1030.istat on arrival,ekg,office note,clearance with chart,will have anesthesia to review. npo after mn-to take metoprolol,norvasc,singular,allegra,prilosec with sip water morning of procedure,to use advair and  bring proventil inhaler also.

## 2011-02-20 NOTE — Progress Notes (Signed)
Chart reviewed by Dr Gevena Mart to proceed at [facility].

## 2011-02-21 ENCOUNTER — Ambulatory Visit (HOSPITAL_BASED_OUTPATIENT_CLINIC_OR_DEPARTMENT_OTHER)
Admission: RE | Admit: 2011-02-21 | Discharge: 2011-02-21 | Disposition: A | Discharge status: Home / Self Care | Payer: Medicare Other | Source: Ambulatory Visit | Attending: Orthopedic Surgery | Admitting: Orthopedic Surgery

## 2011-02-21 ENCOUNTER — Encounter (HOSPITAL_BASED_OUTPATIENT_CLINIC_OR_DEPARTMENT_OTHER): Payer: Self-pay | Admitting: *Deleted

## 2011-02-21 ENCOUNTER — Ambulatory Visit (HOSPITAL_BASED_OUTPATIENT_CLINIC_OR_DEPARTMENT_OTHER): Payer: Medicare Other | Admitting: Anesthesiology

## 2011-02-21 ENCOUNTER — Encounter (HOSPITAL_BASED_OUTPATIENT_CLINIC_OR_DEPARTMENT_OTHER)
Admission: RE | Disposition: A | Discharge status: Home / Self Care | Payer: Self-pay | Source: Ambulatory Visit | Attending: Orthopedic Surgery

## 2011-02-21 ENCOUNTER — Encounter (HOSPITAL_BASED_OUTPATIENT_CLINIC_OR_DEPARTMENT_OTHER): Payer: Self-pay | Admitting: Anesthesiology

## 2011-02-21 DIAGNOSIS — E785 Hyperlipidemia, unspecified: Secondary | ICD-10-CM | POA: Insufficient documentation

## 2011-02-21 DIAGNOSIS — T8489XA Other specified complication of internal orthopedic prosthetic devices, implants and grafts, initial encounter: Secondary | ICD-10-CM | POA: Insufficient documentation

## 2011-02-21 DIAGNOSIS — Z79899 Other long term (current) drug therapy: Secondary | ICD-10-CM | POA: Insufficient documentation

## 2011-02-21 DIAGNOSIS — Y831 Surgical operation with implant of artificial internal device as the cause of abnormal reaction of the patient, or of later complication, without mention of misadventure at the time of the procedure: Secondary | ICD-10-CM | POA: Insufficient documentation

## 2011-02-21 DIAGNOSIS — K219 Gastro-esophageal reflux disease without esophagitis: Secondary | ICD-10-CM | POA: Insufficient documentation

## 2011-02-21 DIAGNOSIS — I2510 Atherosclerotic heart disease of native coronary artery without angina pectoris: Secondary | ICD-10-CM | POA: Insufficient documentation

## 2011-02-21 DIAGNOSIS — J45909 Unspecified asthma, uncomplicated: Secondary | ICD-10-CM | POA: Insufficient documentation

## 2011-02-21 DIAGNOSIS — I10 Essential (primary) hypertension: Secondary | ICD-10-CM | POA: Insufficient documentation

## 2011-02-21 DIAGNOSIS — M79609 Pain in unspecified limb: Secondary | ICD-10-CM | POA: Insufficient documentation

## 2011-02-21 HISTORY — PX: HARDWARE REMOVAL: SHX979

## 2011-02-21 HISTORY — DX: Other specified postprocedural states: Z98.890

## 2011-02-21 HISTORY — DX: Nausea with vomiting, unspecified: R11.2

## 2011-02-21 LAB — POCT I-STAT 4, (NA,K, GLUC, HGB,HCT)
HCT: 37 % (ref 36.0–46.0)
Hemoglobin: 12.6 g/dL (ref 12.0–15.0)

## 2011-02-21 SURGERY — REMOVAL, HARDWARE
Anesthesia: General | Site: Leg Lower | Laterality: Left | Wound class: Clean

## 2011-02-21 MED ORDER — POVIDONE-IODINE 7.5 % EX SOLN: Freq: Once | CUTANEOUS | Status: DC

## 2011-02-21 MED ORDER — FENTANYL CITRATE 0.05 MG/ML IJ SOLN
INTRAMUSCULAR | Status: DC | PRN
  Administered 2011-02-21 (×2): 50 ug via INTRAVENOUS
  Administered 2011-02-21: 25 ug via INTRAVENOUS

## 2011-02-21 MED ORDER — PROPOFOL 10 MG/ML IV EMUL
INTRAVENOUS | Status: DC | PRN
  Administered 2011-02-21: 180 mg via INTRAVENOUS

## 2011-02-21 MED ORDER — OXYCODONE-ACETAMINOPHEN 5-325 MG PO TABS
1.0000 | ORAL_TABLET | ORAL | Status: AC | PRN
  Administered 2011-02-21: 1 via ORAL

## 2011-02-21 MED ORDER — LACTATED RINGERS IV SOLN
INTRAVENOUS | Status: DC
  Administered 2011-02-21: 14:00:00 via INTRAVENOUS
  Administered 2011-02-21: 100 mL/h via INTRAVENOUS
  Administered 2011-02-21: 11:00:00 via INTRAVENOUS

## 2011-02-21 MED ORDER — PROMETHAZINE HCL 25 MG/ML IJ SOLN: 6.2500 mg | INTRAMUSCULAR | Status: DC | PRN

## 2011-02-21 MED ORDER — BUPIVACAINE HCL 0.5 % IJ SOLN
INTRAMUSCULAR | Status: DC | PRN
  Administered 2011-02-21: 5 mL

## 2011-02-21 MED ORDER — DEXTROSE-NACL 5-0.45 % IV SOLN: INTRAVENOUS | Status: DC

## 2011-02-21 MED ORDER — LACTATED RINGERS IV SOLN: INTRAVENOUS | Status: DC

## 2011-02-21 MED ORDER — OXYCODONE-ACETAMINOPHEN 7.5-325 MG PO TABS: 1.0000 | ORAL_TABLET | ORAL | Status: AC | PRN

## 2011-02-21 MED ORDER — ONDANSETRON HCL 4 MG/2ML IJ SOLN
INTRAMUSCULAR | Status: DC | PRN
  Administered 2011-02-21: 4 mg via INTRAVENOUS

## 2011-02-21 MED ORDER — FENTANYL CITRATE 0.05 MG/ML IJ SOLN: 25.0000 ug | INTRAMUSCULAR | Status: DC | PRN

## 2011-02-21 MED ORDER — DEXAMETHASONE SODIUM PHOSPHATE 4 MG/ML IJ SOLN
INTRAMUSCULAR | Status: DC | PRN
  Administered 2011-02-21: 8 mg via INTRAVENOUS

## 2011-02-21 MED ORDER — LIDOCAINE HCL (CARDIAC) 20 MG/ML IV SOLN
INTRAVENOUS | Status: DC | PRN
  Administered 2011-02-21: 50 mg via INTRAVENOUS

## 2011-02-21 SURGICAL SUPPLY — 55 items
BANDAGE ELASTIC 4 VELCRO ST LF (GAUZE/BANDAGES/DRESSINGS) ×2 IMPLANT
BANDAGE ESMARK 6X9 LF (GAUZE/BANDAGES/DRESSINGS) IMPLANT
BANDAGE GAUZE ELAST BULKY 4 IN (GAUZE/BANDAGES/DRESSINGS) ×2 IMPLANT
BENZOIN TINCTURE PRP APPL 2/3 (GAUZE/BANDAGES/DRESSINGS) IMPLANT
BLADE SURG 15 STRL LF DISP TIS (BLADE) ×1 IMPLANT
BLADE SURG 15 STRL SS (BLADE) ×1
BNDG ESMARK 4X9 LF (GAUZE/BANDAGES/DRESSINGS) ×2 IMPLANT
BNDG ESMARK 6X9 LF (GAUZE/BANDAGES/DRESSINGS)
CANISTER SUCTION 1200CC (MISCELLANEOUS) IMPLANT
CANISTER SUCTION 2500CC (MISCELLANEOUS) IMPLANT
CLOTH BEACON ORANGE TIMEOUT ST (SAFETY) ×2 IMPLANT
COVER TABLE BACK 60X90 (DRAPES) ×2 IMPLANT
DRAPE C-ARM 42X72 X-RAY (DRAPES) IMPLANT
DRAPE C-ARM MINI 42X72 WSTRAPS (DRAPES) ×2 IMPLANT
DRAPE EXTREMITY T 121X128X90 (DRAPE) ×2 IMPLANT
DRAPE LG THREE QUARTER DISP (DRAPES) ×4 IMPLANT
DRAPE U-SHAPE 47X51 STRL (DRAPES) ×2 IMPLANT
DRSG EMULSION OIL 3X3 NADH (GAUZE/BANDAGES/DRESSINGS) ×2 IMPLANT
DURAPREP 26ML APPLICATOR (WOUND CARE) ×2 IMPLANT
ELECT REM PT RETURN 9FT ADLT (ELECTROSURGICAL) ×2
ELECTRODE REM PT RTRN 9FT ADLT (ELECTROSURGICAL) ×1 IMPLANT
GLOVE BIOGEL PI IND STRL 6.5 (GLOVE) ×1 IMPLANT
GLOVE BIOGEL PI INDICATOR 6.5 (GLOVE) ×1
GLOVE ECLIPSE 8.0 STRL XLNG CF (GLOVE) ×2 IMPLANT
GLOVE INDICATOR 8.0 STRL GRN (GLOVE) ×2 IMPLANT
GOWN W/2 COTTON TOWELS 2 STD (GOWNS) ×2 IMPLANT
GOWN XL W/COTTON TOWEL STD (GOWNS) ×2 IMPLANT
NEEDLE HYPO 22GX1.5 SAFETY (NEEDLE) ×2 IMPLANT
NS IRRIG 500ML POUR BTL (IV SOLUTION) ×2 IMPLANT
PACK BASIN DAY SURGERY FS (CUSTOM PROCEDURE TRAY) ×2 IMPLANT
PAD CAST 3X4 CTTN HI CHSV (CAST SUPPLIES) IMPLANT
PADDING CAST ABS 4INX4YD NS (CAST SUPPLIES) ×1
PADDING CAST ABS COTTON 4X4 ST (CAST SUPPLIES) ×1 IMPLANT
PADDING CAST COTTON 3X4 STRL (CAST SUPPLIES)
PADDING CAST COTTON 6X4 STRL (CAST SUPPLIES) IMPLANT
PENCIL BUTTON HOLSTER BLD 10FT (ELECTRODE) ×2 IMPLANT
SPONGE GAUZE 4X4 12PLY (GAUZE/BANDAGES/DRESSINGS) ×2 IMPLANT
STOCKINETTE 4X48 STRL (DRAPES) ×2 IMPLANT
STOCKINETTE 6  STRL (DRAPES)
STOCKINETTE 6 STRL (DRAPES) IMPLANT
STRIP CLOSURE SKIN 1/2X4 (GAUZE/BANDAGES/DRESSINGS) IMPLANT
SUCTION FRAZIER TIP 10 FR DISP (SUCTIONS) IMPLANT
SUT ETHILON 4 0 PS 2 18 (SUTURE) ×2 IMPLANT
SUT MON AB 4-0 PC3 18 (SUTURE) IMPLANT
SUT VIC AB 2-0 UR6 27 (SUTURE) IMPLANT
SUT VIC AB 3-0 FS2 27 (SUTURE) IMPLANT
SUT VIC AB 4-0 SH 27 (SUTURE)
SUT VIC AB 4-0 SH 27XANBCTRL (SUTURE) IMPLANT
SYR BULB IRRIGATION 50ML (SYRINGE) ×2 IMPLANT
SYR CONTROL 10ML LL (SYRINGE) ×2 IMPLANT
TOWEL OR 17X24 6PK STRL BLUE (TOWEL DISPOSABLE) ×4 IMPLANT
TUBE CONNECTING 12X1/4 (SUCTIONS) IMPLANT
UNDERPAD 30X30 INCONTINENT (UNDERPADS AND DIAPERS) ×2 IMPLANT
WATER STERILE IRR 500ML POUR (IV SOLUTION) ×2 IMPLANT
YANKAUER SUCT BULB TIP NO VENT (SUCTIONS) IMPLANT

## 2011-02-21 NOTE — Transfer of Care (Signed)
Immediate Anesthesia Transfer of Care Note  Patient: Lindsey Middleton  Procedure(s) Performed:  HARDWARE REMOVAL - REMOVAL TWO SCREWS DISTAL LEFT TIBIA   Patient Location: Patient transported to PACU with oxygen via face mask at 6 Liters / Min  Anesthesia Type: General  Level of Consciousness: awake and alert   Airway & Oxygen Therapy: Patient Spontanous Breathing and Patient connected to face mask oxygen Post-op Assessment: Report given to PACU RN and Post -op Vital signs reviewed and stable  Post vital signs: Reviewed and stable  Complications: No apparent anesthesia complications

## 2011-02-21 NOTE — Anesthesia Postprocedure Evaluation (Signed)
  Anesthesia Post-op Note  Patient: Lindsey Middleton  Procedure(s) Performed:  HARDWARE REMOVAL - REMOVAL TWO SCREWS DISTAL LEFT TIBIA   Patient Location: PACU  Anesthesia Type: General  Level of Consciousness: awake and alert   Airway and Oxygen Therapy: Patient Spontanous Breathing  Post-op Pain: mild  Post-op Assessment: Post-op Vital signs reviewed, Patient's Cardiovascular Status Stable, Respiratory Function Stable, Patent Airway and No signs of Nausea or vomiting  Post-op Vital Signs: stable  Complications: No apparent anesthesia complications

## 2011-02-21 NOTE — Anesthesia Procedure Notes (Signed)
Procedure Name: LMA Insertion Performed by: Caress Reffitt Pre-anesthesia Checklist: Patient identified, Emergency Drugs available, Suction available and Patient being monitored Patient Re-evaluated:Patient Re-evaluated prior to inductionOxygen Delivery Method: Circle System Utilized Preoxygenation: Pre-oxygenation with 100% oxygen Intubation Type: IV induction Ventilation: Mask ventilation without difficulty LMA: LMA with gastric port inserted LMA Size: 4.0 Number of attempts: 1 Placement Confirmation: positive ETCO2 Tube secured with: Tape Dental Injury: Teeth and Oropharynx as per pre-operative assessment      

## 2011-02-21 NOTE — Anesthesia Preprocedure Evaluation (Addendum)
Anesthesia Evaluation  Patient identified by MRN, date of birth, ID band Patient awake    Reviewed: Allergy & Precautions, H&P , NPO status , Patient's Chart, lab work & pertinent test results, reviewed documented beta blocker date and time   History of Anesthesia Complications (+) PONV  Airway Mallampati: II TM Distance: >3 FB Neck ROM: full    Dental No notable dental hx. (+)    Pulmonary neg pulmonary ROS, asthma ,  Mod. To severe asthma clear to auscultation  Pulmonary exam normal       Cardiovascular Exercise Tolerance: Good hypertension, On Home Beta Blockers + CAD, + Cardiac Stents and neg cardio ROS regular Normal Stent 2/12.  Ok since.  No CP   Neuro/Psych Negative Neurological ROS  Negative Psych ROS   GI/Hepatic negative GI ROS, Neg liver ROS, GERD-  Medicated and Controlled,  Endo/Other  Negative Endocrine ROS  Renal/GU negative Renal ROS  Genitourinary negative   Musculoskeletal   Abdominal   Peds  Hematology negative hematology ROS (+)   Anesthesia Other Findings   Reproductive/Obstetrics negative OB ROS                         Anesthesia Physical Anesthesia Plan  ASA: III  Anesthesia Plan: General   Post-op Pain Management:    Induction: Intravenous  Airway Management Planned: LMA  Additional Equipment:   Intra-op Plan:   Post-operative Plan:   Informed Consent: I have reviewed the patients History and Physical, chart, labs and discussed the procedure including the risks, benefits and alternatives for the proposed anesthesia with the patient or authorized representative who has indicated his/her understanding and acceptance.   Dental Advisory Given  Plan Discussed with: CRNA and Surgeon  Anesthesia Plan Comments:         Anesthesia Quick Evaluation

## 2011-02-21 NOTE — Brief Op Note (Signed)
02/21/2011  2:01 PM  PATIENT:  Lindsey Middleton  70 y.o. female  PRE-OPERATIVE DIAGNOSIS:  PAINFUL HARDWARE LEFT TIBIA  POST-OPERATIVE DIAGNOSIS:  PAINFUL HARDWARE LEFT TIBIA  PROCEDURE:  Procedure(s): HARDWARE REMOVAL (2 screws)  Lt tibia SURGEON:  Surgeon(s): James P Aplington  PHYSICIAN ASSISTANT: none  ASSISTANTS: nurse   ANESTHESIA:   local and general  EBL:  Total I/O In: 200 [I.V.:200] Out: -   BLOOD ADMINISTERED:none  DRAINS: none   LOCAL MEDICATIONS USED:  MARCAINE 5CC  SPECIMEN:  No Specimen  DISPOSITION OF SPECIMEN:  N/A  COUNTS:  YES  TOURNIQUET:   Total Tourniquet Time Documented: Thigh (Left) - 27 minutes  DICTATION: .Other Dictation: Dictation Number [id number]  PLAN OF CARE: Discharge to home after PACU  PATIENT DISPOSITION:  PACU - hemodynamically stable.

## 2011-02-21 NOTE — H&P (Signed)
Lindsey Middleton is an 70 y.o. female.   Chief Complaint: painful lt ankle HPIpost intra medullary tibial with painful retained screws  Past Medical History  Diagnosis Date  . Coronary artery disease     s/p Rotablator to mid RCA in 2/12  . Weakness   . Fatigue   . GERD (gastroesophageal reflux disease)   . Dyslipidemia   . Hypertension   . Varicose vein   . PONV (postoperative nausea and vomiting)     Past Surgical History  Procedure Date  . Coronary angioplasty 2/12    Rotablator to the RCA  . Abdominal hysterectomy 1996    total w/BSO  . Orif tibia & fibula fractures 2000    Family History  Problem Relation Age of Onset  . Heart attack Mother   . Heart attack Father   . Heart attack Sister   . Heart attack Brother   . Heart attack Brother   . Heart attack Brother    Social History:  reports that she has never smoked. She does not have any smokeless tobacco history on file. She reports that she does not drink alcohol or use illicit drugs.  Allergies:  Allergies  Allergen Reactions  . Morphine     REACTION: Anaphlactic shock    Medications Prior to Admission  Medication Dose Route Frequency Provider Last Rate Last Dose  . dextrose 5 %-0.45 % sodium chloride infusion   Intravenous Continuous Sol Englert P Donyale Falcon      . lactated ringers infusion   Intravenous Continuous Einar Pheasant, MD      . povidone-iodine (BETADINE) 7.5 % scrub   Topical Once Otha Rickles P Sheleen Conchas       Medications Prior to Admission  Medication Sig Dispense Refill  . albuterol (PROVENTIL) (2.5 MG/3ML) 0.083% nebulizer solution Take 2.5 mg by nebulization every 6 (six) hours as needed.        Marland Kitchen amLODipine (NORVASC) 5 MG tablet Take 1 tablet (5 mg total) by mouth 2 (two) times daily.  30 tablet  11  . esomeprazole (NEXIUM) 40 MG capsule Take 40 mg by mouth daily before breakfast.        . Fenofibrate (TRICOR PO) Take by mouth daily.        . fexofenadine (ALLEGRA) 180 MG tablet Take 180 mg by  mouth daily.        . fish oil-omega-3 fatty acids 1000 MG capsule Take 2 g by mouth daily.        . Fluticasone-Salmeterol (ADVAIR DISKUS) 250-50 MCG/DOSE AEPB Inhale 2 puffs into the lungs daily.        . isosorbide mononitrate (IMDUR) 30 MG 24 hr tablet Take 1 tablet (30 mg total) by mouth daily.  30 tablet  11  . metoprolol (TOPROL-XL) 100 MG 24 hr tablet Take whole tablet in the morning and a half of a tablet at night.  45 tablet  6  . montelukast (SINGULAIR) 10 MG tablet Take 10 mg by mouth at bedtime.        . rosuvastatin (CRESTOR) 20 MG tablet Take 20 mg by mouth daily.        Marland Kitchen aspirin 325 MG tablet Take 325 mg by mouth daily.        . Cholecalciferol (VITAMIN D PO) Take by mouth every 14 (fourteen) days.        . nitroGLYCERIN (NITROSTAT) 0.4 MG SL tablet Place 0.4 mg under the tongue every 5 (five) minutes as needed.        Marland Kitchen  omeprazole (PRILOSEC OTC) 20 MG tablet Take 20 mg by mouth as needed.        . prasugrel (EFFIENT) 10 MG TABS Take 10 mg by mouth daily.          Results for orders placed during the hospital encounter of 02/21/11 (from the past 48 hour(s))  POCT I-STAT 4, (NA,K, GLUC, HGB,HCT)     Status: Abnormal   Collection Time   02/21/11 11:24 AM      Component Value Range Comment   Sodium 144  135 - 145 (mEq/L)    Potassium 4.1  3.5 - 5.1 (mEq/L)    Glucose, Bld 107 (*) 70 - 99 (mg/dL)    HCT 16.1  09.6 - 04.5 (%)    Hemoglobin 12.6  12.0 - 15.0 (g/dL)    No results found.  ROS  Blood pressure 130/73, pulse 65, temperature 97.9 F (36.6 C), temperature source Oral, resp. rate 18, height 5\' 6"  (1.676 m), weight 81.194 kg (179 lb), SpO2 100.00%. Physical Exam  Constitutional: She is oriented to person, place, and time. She appears well-developed and well-nourished.  HENT:  Head: Normocephalic and atraumatic.  Right Ear: External ear normal.  Left Ear: External ear normal.  Nose: Nose normal.  Mouth/Throat: Oropharynx is clear and moist.  Eyes: Conjunctivae  and EOM are normal. Pupils are equal, round, and reactive to light.  Neck: Normal range of motion. Neck supple.  Cardiovascular: Normal rate, regular rhythm, normal heart sounds and intact distal pulses.   Respiratory: Effort normal and breath sounds normal.  GI: Soft. Bowel sounds are normal.  Musculoskeletal: She exhibits tenderness.  Neurological: She is alert and oriented to person, place, and time. She has normal reflexes.  Skin: Skin is warm and dry.  Psychiatric: She has a normal mood and affect. Her behavior is normal. Judgment and thought content normal.     Assessment/Plan Painful retained screws lt tibia Removal 2 screws lt tibia  Maitri Schnoebelen P 02/21/2011, 1:12 PM

## 2011-02-22 NOTE — Op Note (Signed)
NAME:  Lindsey Middleton, Lindsey Middleton NO.:  [id number]  MEDICAL RECORD NO.:  [id number]  LOCATION:                               FACILITY:  [facility]  PHYSICIAN:  Marlowe Kays, M.D.  DATE OF BIRTH:  28-Oct-1940  DATE OF PROCEDURE:  02/21/2011 DATE OF DISCHARGE:                              OPERATIVE REPORT   PREOPERATIVE DIAGNOSIS:  Two retained painful screws, distal left tibia.  POSTOPERATIVE DIAGNOSIS:  Two retained painful screws, distal left tibia.  OPERATION:  Removal of 2 screws in the distal left tibia.  SURGEON:  Marlowe Kays, M.D.  ASSISTANT:  Nurse.  ANESTHESIA:  General.  PATHOLOGY JUSTIFICATION FOR PROCEDURE:  She has a past history of intramedullary nailing of the tibia with 2 distal interlocking screws, which are painful for her and tender.  She desired to have them removed. There was no evidence for any screw breakage or infection or other abnormality with this actual surgery itself.  PROCEDURE IN DETAIL:  Satisfactory general anesthesia, pneumatic tourniquet with the left leg Esmarch out nonsterilely and tourniquet inflated to 325 mmHg.  Left leg was then prepped with DuraPrep from just past the knee to toes and draped in a sterile field.  Using mini C-arm, I was able to localize the 2 screws, one of which was quite palpable, the other not so quite palpable.  With small incisions, they were both removed.  Wound was irrigated and infiltrated with 0.5% plain Marcaine. I then closed the deep subcutaneous tissue with interrupted 3-0 Vicryl and the skin with 4-0 nylon.  Betadine, Adaptic, dry sterile dressing were applied.  Tourniquet was released.  She tolerated the procedure well, was taken to the recovery room in satisfied condition with no known complications.          ______________________________ Marlowe Kays, M.D.     JA/MEDQ  D:  02/21/2011  T:  02/22/2011  Job:  [id number]

## 2011-02-24 ENCOUNTER — Encounter (HOSPITAL_BASED_OUTPATIENT_CLINIC_OR_DEPARTMENT_OTHER): Payer: Self-pay | Admitting: Orthopedic Surgery

## 2011-03-06 ENCOUNTER — Other Ambulatory Visit: Payer: Self-pay | Admitting: *Deleted

## 2011-03-06 MED ORDER — MONTELUKAST SODIUM 10 MG PO TABS: 10.0000 mg | ORAL_TABLET | Freq: Every day | ORAL | Status: DC

## 2011-03-06 NOTE — Telephone Encounter (Signed)
Refilled singulair one time

## 2011-05-06 ENCOUNTER — Ambulatory Visit: Payer: Medicare Other | Admitting: Cardiology

## 2011-05-06 ENCOUNTER — Ambulatory Visit: Payer: Medicare Other | Admitting: Nurse Practitioner

## 2011-05-21 ENCOUNTER — Other Ambulatory Visit: Payer: Self-pay | Admitting: Cardiology

## 2011-06-05 ENCOUNTER — Encounter: Payer: Self-pay | Admitting: Cardiology

## 2011-06-08 NOTE — Progress Notes (Signed)
Quick Note:  Please make copy of labs for patient visit. ______ 

## 2011-06-09 ENCOUNTER — Encounter: Payer: Self-pay | Admitting: Cardiology

## 2011-06-09 ENCOUNTER — Ambulatory Visit (INDEPENDENT_AMBULATORY_CARE_PROVIDER_SITE_OTHER): Payer: Medicare Other | Admitting: Cardiology

## 2011-06-09 DIAGNOSIS — I2510 Atherosclerotic heart disease of native coronary artery without angina pectoris: Secondary | ICD-10-CM

## 2011-06-09 DIAGNOSIS — E785 Hyperlipidemia, unspecified: Secondary | ICD-10-CM

## 2011-06-09 DIAGNOSIS — R079 Chest pain, unspecified: Secondary | ICD-10-CM

## 2011-06-09 DIAGNOSIS — I10 Essential (primary) hypertension: Secondary | ICD-10-CM

## 2011-06-09 MED ORDER — LISINOPRIL 10 MG PO TABS: 10.0000 mg | ORAL_TABLET | Freq: Every day | ORAL | Status: DC

## 2011-06-09 NOTE — Assessment & Plan Note (Signed)
The patient has a history of dyslipidemia and is on TriCor and Crestor.  Her lipids are monitored by her primary care physician

## 2011-06-09 NOTE — Patient Instructions (Signed)
Your physician wants you to follow-up in: 4 months with Dr. Patty Sermons.  You will receive a reminder letter in the mail two months in advance. If you don't receive a letter, please call our office to schedule the follow-up appointment.  Start Lisinopril 10mg  daily.  Your physician has requested that you have an exercise stress myoview. For further information please visit [email or web address] Please follow instruction sheet, as given.

## 2011-06-09 NOTE — Progress Notes (Signed)
Lindsey Middleton Date of Birth:  1940-10-28 [facility] [address] [place], [place]  [address] [phone]         Fax   [phone]  History of Present Illness: This pleasant 71 year old woman is seen for a scheduled followup office visit.  She has a history of ischemic heart disease.  He had a Rotablator to her mid right coronary artery in February 2012 followed by placement of a single drug-eluting stent to the mid right coronary artery.  When I last saw her in November 2012 she is not experiencing any angina pectoris.  Since then she has had some exertional chest discomfort.  She notes that sublingual nitroglycerin helps.  Sometimes she will have the discomfort with only minor degrees of exertion.  Also, she states that her blood pressure has been persistently running high particularly on the diastolic.  Current Outpatient Prescriptions  Medication Sig Dispense Refill  . albuterol (PROVENTIL) (2.5 MG/3ML) 0.083% nebulizer solution Take 2.5 mg by nebulization every 6 (six) hours as needed.        Marland Kitchen amLODipine (NORVASC) 5 MG tablet Take 1 tablet (5 mg total) by mouth 2 (two) times daily.  30 tablet  11  . aspirin 325 MG tablet Take 325 mg by mouth daily.        . Cholecalciferol (VITAMIN D PO) Take by mouth every 14 (fourteen) days.        Marland Kitchen esomeprazole (NEXIUM) 40 MG capsule Take 40 mg by mouth daily before breakfast.        . Fenofibrate (TRICOR PO) Take by mouth daily.        . fexofenadine (ALLEGRA) 180 MG tablet Take 180 mg by mouth daily.        . Fluticasone-Salmeterol (ADVAIR DISKUS) 250-50 MCG/DOSE AEPB Inhale 2 puffs into the lungs daily.        . metoprolol (TOPROL-XL) 100 MG 24 hr tablet Take whole tablet in the morning and a half of a tablet at night.  45 tablet  6  . montelukast (SINGULAIR) 10 MG tablet Take 1 tablet (10 mg total) by mouth at bedtime.  30 tablet  0  . NITROSTAT 0.4 MG SL tablet USE AS DIRECTED AS NEEDED  25 tablet  3  . rosuvastatin  (CRESTOR) 20 MG tablet Take 20 mg by mouth daily.        . isosorbide mononitrate (IMDUR) 30 MG 24 hr tablet Take 1 tablet (30 mg total) by mouth daily.  30 tablet  11  . lisinopril (PRINIVIL,ZESTRIL) 10 MG tablet Take 1 tablet (10 mg total) by mouth daily.  30 tablet  11    Allergies  Allergen Reactions  . Morphine     REACTION: Anaphlactic shock    Patient Active Problem List  Diagnoses  . HYPERLIPIDEMIA  . HYPERTENSION  . ASTHMA  . CHEST PAIN UNSPECIFIED  . NONSPECIFIC ABNORMAL UNSPEC CV FUNCTION STUDY  . Coronary artery disease  . Chest pain  . Weakness  . Fatigue  . GERD (gastroesophageal reflux disease)  . Dyslipidemia  . Hypertension  . Malaise  . Ischemic heart disease  . Edema    History  Smoking status  . Never Smoker   Smokeless tobacco  . Not on file    History  Alcohol Use No    Family History  Problem Relation Age of Onset  . Heart attack Mother   . Heart attack Father   . Heart attack Sister   . Heart attack  Brother   . Heart attack Brother   . Heart attack Brother     Review of Systems: Constitutional: no fever chills diaphoresis or fatigue or change in weight.  Head and neck: no hearing loss, no epistaxis, no photophobia or visual disturbance. Respiratory: No cough, shortness of breath or wheezing. Cardiovascular: No chest pain peripheral edema, palpitations. Gastrointestinal: No abdominal distention, no abdominal pain, no change in bowel habits hematochezia or melena. Genitourinary: No dysuria, no frequency, no urgency, no nocturia. Musculoskeletal:No arthralgias, no back pain, no gait disturbance or myalgias. Neurological: No dizziness, no headaches, no numbness, no seizures, no syncope, no weakness, no tremors. Hematologic: No lymphadenopathy, no easy bruising. Psychiatric: No confusion, no hallucinations, no sleep disturbance.    Physical Exam: Filed Vitals:   06/09/11 1126  BP: 140/98  Pulse: 70   the general appearance  reveals a well-developed well-nourished alert woman in no distress.The head and neck exam reveals pupils equal and reactive.  Extraocular movements are full.  There is no scleral icterus.  The mouth and pharynx are normal.  The neck is supple.  The carotids reveal no bruits.  The jugular venous pressure is normal.  The  thyroid is not enlarged.  There is no lymphadenopathy.  The chest is clear to percussion and auscultation.  There are no rales or rhonchi.  Expansion of the chest is symmetrical.  The precordium is quiet.  The first heart sound is normal.  The second heart sound is physiologically split.  There is no murmur gallop rub or click.  There is no abnormal lift or heave.  The abdomen is soft and nontender.  The bowel sounds are normal.  The liver and spleen are not enlarged.  There are no abdominal masses.  There are no abdominal bruits.  Extremities reveal good pedal pulses.  There is no phlebitis or edema.  There is no cyanosis or clubbing.  Strength is normal and symmetrical in all extremities.  There is no lateralizing weakness.  There are no sensory deficits.  The skin is warm and dry.  There is no rash.  EKG today shows normal sinus rhythm and T-wave inversion in V1 through V3 unchanged from February 2012   Assessment / Plan:  We will have her return for a Myoview stress test.  For her blood pressure we will add lisinopril 10 mg daily and get a followup basal metabolic panel in one week as noted above. Recheck for routine followup office visit in 4 months.

## 2011-06-09 NOTE — Assessment & Plan Note (Signed)
Her blood pressure is running high today.  I checked it myself and got 150/95 in the right arm sitting.  She is not presently on any ACE inhibitor and we will and lisinopril 10 mg one daily.  She will get a basal metabolic panel at her laboratory in [place] one week after starting lisinopril and she will forward the results to [place].

## 2011-06-09 NOTE — Assessment & Plan Note (Signed)
The patient does have known coronary artery disease and 14 months ago had Rotablator and drug-eluting stent to her right coronary artery.  She is now experiencing some recurrent exertional chest discomfort relieved by nitroglycerin.  Her electrocardiogram today shows him to to inversion similar to February 2012.  We will have her return soon for a treadmill Myoview stress test.

## 2011-06-10 ENCOUNTER — Encounter (HOSPITAL_COMMUNITY): Payer: Medicare Other

## 2011-06-11 ENCOUNTER — Telehealth: Payer: Self-pay | Admitting: Cardiology

## 2011-06-11 NOTE — Telephone Encounter (Signed)
New Problem:     Patient called in because she misplaced the written order Dr. Patty Sermons gave her to have her blood work drawn and would like to have a new order written.  Please call back if you have any questions. Fax#[phone]

## 2011-06-11 NOTE — Telephone Encounter (Signed)
Faxed order as requested.

## 2011-06-16 ENCOUNTER — Ambulatory Visit (HOSPITAL_COMMUNITY): Payer: Medicare Other | Attending: Internal Medicine | Admitting: Radiology

## 2011-06-16 DIAGNOSIS — I10 Essential (primary) hypertension: Secondary | ICD-10-CM | POA: Insufficient documentation

## 2011-06-16 DIAGNOSIS — R5383 Other fatigue: Secondary | ICD-10-CM | POA: Insufficient documentation

## 2011-06-16 DIAGNOSIS — I2510 Atherosclerotic heart disease of native coronary artery without angina pectoris: Secondary | ICD-10-CM | POA: Insufficient documentation

## 2011-06-16 DIAGNOSIS — R42 Dizziness and giddiness: Secondary | ICD-10-CM | POA: Insufficient documentation

## 2011-06-16 DIAGNOSIS — E785 Hyperlipidemia, unspecified: Secondary | ICD-10-CM | POA: Insufficient documentation

## 2011-06-16 DIAGNOSIS — R61 Generalized hyperhidrosis: Secondary | ICD-10-CM | POA: Insufficient documentation

## 2011-06-16 DIAGNOSIS — Z9861 Coronary angioplasty status: Secondary | ICD-10-CM | POA: Insufficient documentation

## 2011-06-16 DIAGNOSIS — Z8249 Family history of ischemic heart disease and other diseases of the circulatory system: Secondary | ICD-10-CM | POA: Insufficient documentation

## 2011-06-16 DIAGNOSIS — R079 Chest pain, unspecified: Secondary | ICD-10-CM | POA: Insufficient documentation

## 2011-06-16 DIAGNOSIS — R5381 Other malaise: Secondary | ICD-10-CM | POA: Insufficient documentation

## 2011-06-16 DIAGNOSIS — R000 Tachycardia, unspecified: Secondary | ICD-10-CM | POA: Insufficient documentation

## 2011-06-16 DIAGNOSIS — J45909 Unspecified asthma, uncomplicated: Secondary | ICD-10-CM | POA: Insufficient documentation

## 2011-06-16 DIAGNOSIS — R0602 Shortness of breath: Secondary | ICD-10-CM

## 2011-06-16 MED ORDER — TECHNETIUM TC 99M TETROFOSMIN IV KIT
32.8000 | PACK | Freq: Once | INTRAVENOUS | Status: AC | PRN
  Administered 2011-06-16: 32.8 via INTRAVENOUS

## 2011-06-16 MED ORDER — TECHNETIUM TC 99M TETROFOSMIN IV KIT
10.7000 | PACK | Freq: Once | INTRAVENOUS | Status: AC | PRN
  Administered 2011-06-16: 11 via INTRAVENOUS

## 2011-06-16 NOTE — Progress Notes (Signed)
[facility] SITE 3 NUCLEAR MED [address] [place] [address] [phone]  Cardiology Nuclear Med Study  Lindsey Middleton is a 71 y.o. female     MRN : [id number]     DOB: Jul 22, 1940  Procedure Date: 06/16/2011  Nuclear Med Background Indication for Stress Test:  Evaluation for Ischemia, Stent Patency and PTCA Patency History:  Asthma, 2/12 Angioplasty,2'12'Heart Catheterization RCA 95%-PTCA/Stent nonobdt dz LAD EF:65%,1/12 Myocardial Perfusion Study -apical isch EF:63%, 2/12' Stents Mid RCA Cardiac Risk Factors: Family History - CAD, Hypertension and Lipids  Symptoms:  Chest Pain, Chest Pain with Exertion (last date of chest discomfort 06/14/11), Diaphoresis, Fatigue, Light-Headedness and Rapid HR   Nuclear Pre-Procedure Caffeine/Decaff Intake:  None NPO After: 7:00pm   Lungs:  clear O2 Sat: 97% on room air. IV 0.9% NS with Angio Cath:  22g  IV Site: R Antecubital  IV Started by:  Milana Na, EMT-P  Chest Size (in):  36 Cup Size: D  Height: 66" 5\' 6"  (1.676 m)  Weight:  186 lb (84.369 kg)  BMI:  Body mass index is 30.02 kg/(m^2). Tech Comments:  n/a    Nuclear Med Study 1 or 2 day study: 1 day  Stress Test Type:  Stress  Reading MD: Dietrich Pates, MD  Order Authorizing Provider:  Villa Herb  Resting Radionuclide: Technetium 28m Tetrofosmin  Resting Radionuclide Dose: 10.7 mCi   Stress Radionuclide:  Technetium 62m Tetrofosmin  Stress Radionuclide Dose: 32.8 mCi           Stress Protocol Rest HR: 64 Stress HR: 134  Rest BP: 133/92 Stress BP: 190/106  Exercise Time (min)6:29mins METS: 7.00   Predicted Max HR: 150 bpm % Max HR: 89.33 bpm Rate Pressure Product: 27253   Dose of Adenosine (mg):  n/a Dose of Lexiscan: n/a mg  Dose of Atropine (mg): n/a Dose of Dobutamine: n/a mcg/kg/min (at max HR)  Stress Test Technologist: Frederick Peers, EMT-P  Nuclear Technologist:  Domenic Polite, CNMT     Rest Procedure:  Myocardial perfusion imaging  was performed at rest 45 minutes following the intravenous administration of Technetium 82m Tetrofosmin. Rest ECG: NSR with non-specific ST-T wave changes  Stress Procedure:  The patient performed treadmill exercise using a Bruce  Protocol for 6:00 min minutes. The patient stopped due to weakness,fatiques,near synicopal and denied any chest pain.  There were no significant ST-T wave changes.  Technetium 63m Tetrofosmin was injected at peak exercise and myocardial perfusion imaging was performed after a brief delay. Stress ECG: No significant change from baseline ECG  QPS Raw Data Images:  Images were motion corrected.  Soft tissue (diaphragm, breast, bowel activity) surround heart. Stress Images:  Normal perfusion and apical thinning. Rest Images:  Comparison with the stress images reveals no significant change. Subtraction (SDS):  No evidence of ischemia. Transient Ischemic Dilatation (Normal <1.22):  1.01 Lung/Heart Ratio (Normal <0.45):  0.31  Quantitative Gated Spect Images QGS EDV:  87 ml QGS ESV:  28 ml  Impression Exercise Capacity:  Fair exercise capacity. BP Response:  Normal blood pressure response. Clinical Symptoms:  No chest pain. ECG Impression:  No significant ST segment change suggestive of ischemia. Comparison with Prior Nuclear Study: No apical ischemia present.  Overall Impression:  Normal stress nuclear study.  LV Ejection Fraction: 68%.  LV Wall Motion:  NL LV Function; NL Wall Motion Dietrich Pates

## 2011-06-18 ENCOUNTER — Telehealth: Payer: Self-pay | Admitting: *Deleted

## 2011-06-18 NOTE — Telephone Encounter (Signed)
Most probably small vessel disease and spasm. Keep using NTG

## 2011-06-18 NOTE — Telephone Encounter (Signed)
Left message

## 2011-06-18 NOTE — Telephone Encounter (Signed)
Advised of results.  Would like to know what  Dr. Patty Sermons thinks may be causing her chest pressure and shortness of breath.  Is short of breath with little exertion, even running vacuum. Does use NTG with chest pressure/pain at least a few times a week and it is relieved.  Will forward to  Dr. Patty Sermons for review

## 2011-06-18 NOTE — Telephone Encounter (Signed)
Message copied by Burnell Blanks on Wed Jun 18, 2011  8:47 AM ------      Message from: Cassell Clement      Created: Tue Jun 17, 2011 12:52 PM       Please report.  The stress test was normal.  There was no ischemia.  The previous apical ischemia present prior to stent is no longer seen.  Normal LV function. Continue present meds.

## 2011-06-20 NOTE — Telephone Encounter (Signed)
Patient never called back.

## 2011-09-15 ENCOUNTER — Other Ambulatory Visit: Payer: Self-pay | Admitting: Cardiology

## 2011-09-26 NOTE — Telephone Encounter (Signed)
[facility] calling about rx havent received back

## 2011-10-06 ENCOUNTER — Telehealth: Payer: Self-pay | Admitting: *Deleted

## 2011-10-06 NOTE — Telephone Encounter (Signed)
spok

## 2011-10-07 NOTE — Telephone Encounter (Signed)
Spoke with patient yesterday to see about patient and her husband coming to see  Dr. Patty Sermons Thursday instead of the Friday appointment they have.  She stated her husband had a sleep study scheduled for Friday and would call back next week to schedule. Cancelled appointments on Friday

## 2011-10-10 ENCOUNTER — Ambulatory Visit: Payer: Medicare Other | Admitting: Cardiology

## 2011-12-30 ENCOUNTER — Other Ambulatory Visit: Payer: Self-pay | Admitting: Obstetrics & Gynecology

## 2011-12-30 DIAGNOSIS — N63 Unspecified lump in unspecified breast: Secondary | ICD-10-CM

## 2012-01-01 ENCOUNTER — Other Ambulatory Visit: Payer: Self-pay | Admitting: Obstetrics & Gynecology

## 2012-01-01 ENCOUNTER — Ambulatory Visit
Admission: RE | Admit: 2012-01-01 | Discharge: 2012-01-01 | Disposition: A | Discharge status: Home / Self Care | Payer: Medicare Other | Source: Ambulatory Visit | Attending: Obstetrics & Gynecology | Admitting: Obstetrics & Gynecology

## 2012-01-01 DIAGNOSIS — N63 Unspecified lump in unspecified breast: Secondary | ICD-10-CM

## 2012-02-06 ENCOUNTER — Ambulatory Visit: Payer: Medicare Other | Admitting: Cardiology

## 2012-02-06 ENCOUNTER — Telehealth: Payer: Self-pay | Admitting: Cardiology

## 2012-02-06 NOTE — Telephone Encounter (Signed)
Left message to call back  

## 2012-02-06 NOTE — Telephone Encounter (Signed)
F/u   Pt daughter Wilkie Aye [phone] returning nurse call.

## 2012-02-06 NOTE — Telephone Encounter (Signed)
New problem:   Patient had procedure this am - vascular ablation in leg.   While getting up to dress patient was fine at that moment. Patient  fainting B/p 200 /98 . Having T-waves.  The MD in [place] suggest she to have  a cath. Right now she in the er they done blood gas, echo,EKG,  However, the hospialist   is suggest admission. The daughter has question on what to do.

## 2012-02-06 NOTE — Telephone Encounter (Signed)
Spoke to the patient daughter a couple of times today.  Patient is currently in the hospital and daughter wanted her transferred to [facility] if being admitted for more than observation.  Daughter stated that she was seen by physicians and then when was told she wanted to be transferred that they didn't seem to want to help.  Daughter stated patient had been in a room with no care at all from the nurse for quite a while.  Daughter stated she felt as though she was making physicians angry and no one wanted to really take care of he mother.  I advised the daughter to speak with head nurse to see if she could get anywhere.

## 2012-02-06 NOTE — Telephone Encounter (Signed)
Follow-up: ° ° ° ° °Returned your call.  Please call back. °

## 2012-02-09 ENCOUNTER — Encounter: Payer: Self-pay | Admitting: Nurse Practitioner

## 2012-02-09 ENCOUNTER — Other Ambulatory Visit: Payer: Self-pay | Admitting: Nurse Practitioner

## 2012-02-09 ENCOUNTER — Ambulatory Visit (INDEPENDENT_AMBULATORY_CARE_PROVIDER_SITE_OTHER): Payer: Medicare Other | Admitting: Nurse Practitioner

## 2012-02-09 VITALS — BP 120/88 | HR 68 | Ht 66.0 in | Wt 183.8 lb

## 2012-02-09 DIAGNOSIS — R55 Syncope and collapse: Secondary | ICD-10-CM

## 2012-02-09 DIAGNOSIS — Z01810 Encounter for preprocedural cardiovascular examination: Secondary | ICD-10-CM

## 2012-02-09 LAB — CBC WITH DIFFERENTIAL/PLATELET
Basophils Absolute: 0 10*3/uL (ref 0.0–0.1)
Basophils Relative: 0.4 % (ref 0.0–3.0)
Eosinophils Absolute: 0.1 10*3/uL (ref 0.0–0.7)
Eosinophils Relative: 0.8 % (ref 0.0–5.0)
HCT: 43.3 % (ref 36.0–46.0)
Hemoglobin: 14.4 g/dL (ref 12.0–15.0)
Lymphocytes Relative: 21.4 % (ref 12.0–46.0)
Lymphs Abs: 2.4 10*3/uL (ref 0.7–4.0)
MCHC: 33.3 g/dL (ref 30.0–36.0)
MCV: 89.2 fl (ref 78.0–100.0)
Monocytes Absolute: 0.9 10*3/uL (ref 0.1–1.0)
Monocytes Relative: 8.1 % (ref 3.0–12.0)
Neutro Abs: 7.7 10*3/uL (ref 1.4–7.7)
Neutrophils Relative %: 69.3 % (ref 43.0–77.0)
Platelets: 233 10*3/uL (ref 150.0–400.0)
RBC: 4.85 Mil/uL (ref 3.87–5.11)
RDW: 13.1 % (ref 11.5–14.6)
WBC: 11.1 10*3/uL — ABNORMAL HIGH (ref 4.5–10.5)

## 2012-02-09 LAB — BASIC METABOLIC PANEL
BUN: 19 mg/dL (ref 6–23)
CO2: 27 mEq/L (ref 19–32)
Calcium: 10.3 mg/dL (ref 8.4–10.5)
Chloride: 99 mEq/L (ref 96–112)
Creatinine, Ser: 0.7 mg/dL (ref 0.4–1.2)
GFR: 87.56 mL/min (ref >60.00)
Glucose, Bld: 115 mg/dL — ABNORMAL HIGH (ref 70–99)
Potassium: 4.3 mEq/L (ref 3.5–5.1)
Sodium: 136 mEq/L (ref 135–145)

## 2012-02-09 LAB — PROTIME-INR
INR: 1.1 ratio — ABNORMAL HIGH (ref 0.8–1.0)
Prothrombin Time: 11.8 s (ref 10.2–12.4)

## 2012-02-09 LAB — APTT: aPTT: 32.1 s — ABNORMAL HIGH (ref 21.7–28.8)

## 2012-02-09 NOTE — Patient Instructions (Addendum)
You are not to drive  We need to arrange for a heart catheterization for Thursday with Dr. Swaziland.  We are going to place an event monitor for the next 30 days  We need to check labs today  You are scheduled for a cardiac catheterization on Thursday, December 19th with Dr. Swaziland or associate.  Go to [facility] 2nd Floor Short Stay on Thursday at 11 AM for a 1PM procedure start time No food or drink after midnight on Wednesday. You may take your medications with a sip of water on the day of your procedure.   Coronary Angiography Coronary angiography is an X-ray procedure used to look at the arteries in the heart. In this procedure, a dye is injected through a long, hollow tube (catheter). The catheter is about the size of a piece of cooked spaghetti. The catheter injects a dye into an artery in your groin. X-rays are then taken to show if there is a blockage in the arteries of your heart. BEFORE THE PROCEDURE   Let your caregiver know if you have allergies to shellfish or contrast dye. Also let your caregiver know if you have kidney problems or failure.  Do not eat or drink starting from midnight up to the time of the procedure, or as directed.  You may drink enough water to take your medications the morning of the procedure if you were instructed to do so.  You should be at the hospital or outpatient facility where the procedure is to be done 60 minutes prior to the procedure or as directed. PROCEDURE  You may be given an IV medication to help you relax before the procedure.  You will be prepared for the procedure by washing and shaving the area where the catheter will be inserted. This is usually done in the groin but may be done in the fold of your arm by your elbow.  A medicine will be given to numb your groin where the catheter will be inserted.  A specially trained doctor will insert the catheter into an artery in your groin. The catheter is guided by using a special type of  X-ray (fluoroscopy) to the blood vessel being examined.  A special dye is then injected into the catheter and X-rays are taken. The dye helps to show where any narrowing or blockages are located in the heart arteries. AFTER THE PROCEDURE   After the procedure you will be kept in bed lying flat for several hours. You will be instructed to not bend or cross your legs.  The groin insertion site will be watched and checked frequently.  The pulse in your feet will be checked frequently.  Additional blood tests, X-rays and an EKG may be done.  You may stay in the hospital overnight for observation. SEEK IMMEDIATE MEDICAL CARE IF:   You develop chest pain, shortness of breath, feel faint, or pass out.  There is bleeding, swelling, or drainage from the catheter insertion site.  You develop pain, discoloration, coldness, or severe bruising in the leg or area where the catheter was inserted.  You have a fever. Document Released: 08/17/2002 Document Revised: 05/05/2011 Document Reviewed: 10/06/2007 [facility] Patient Information 2013 [place], [place].

## 2012-02-09 NOTE — Progress Notes (Addendum)
Lindsey Middleton Date of Birth: Nov 27, 1940 Medical Record #[id number]  History of Present Illness: [name] is seen back today for a work in visit. She is seen for Dr. Patty Sermons. She has multiple issues which include known ischemic heart disease, remote MI in 1986prior rotablator to the mid RCA in February of 2012, followed by placement of DES to the mid RCA. Her other issues include GERD, HLD, asthma, and varicose veins with past DVT.   She was last here in April, felt to be doing well.   Was most recently admitted to the hospital in [place]. She had undergone endovenous RF ablation of the left SVG. Apparently having PVCs during the procedure.  Did fine right after the procedure. Just prior to leaving, had an episode of syncope. One of the nurses apparently caught her. She did not hit her head. She had gotten weak and clammy. She did not have any associated chest pain or prodome but endorses at least a 6 month history of worsening dyspnea, increased orthopnea, chest pain and palpitations with at least 4 to 6 episodes of syncope over the past 6 months - has not called to report this to [place]. Cardiac cath was recommended but they opted to come back to [facility] and follow up with Dr. Patty Sermons. Her blood pressure medicines had just been changed prior to this vein procedure in which HCTZ had been added. She had had dose of her medicine the night before as well as the morning of the procedure and had not eaten. She is under a lot of stress with her husband who has dementia.   She comes in today. She is here with her daughter. She has been home since Saturday. Has continued to have chest pain and is short of breath. Has still felt very presyncopal. She is still driving. Also reports that her blood pressure has "been all over the place" with readings over 200 systolic and then down to 120's.   Current Outpatient Prescriptions on File Prior to Visit  Medication Sig Dispense Refill  . albuterol (PROVENTIL) (2.5  MG/3ML) 0.083% nebulizer solution Take 2.5 mg by nebulization every 6 (six) hours as needed. FOR SHORTNESS OF BREATH OR WHEEZING      . amLODipine (NORVASC) 5 MG tablet Take 1 tablet (5 mg total) by mouth 2 (two) times daily.  30 tablet  11  . aspirin 325 MG tablet Take 325 mg by mouth daily.        . Cholecalciferol (VITAMIN D PO) Take 1 tablet by mouth every 14 (fourteen) days.       Marland Kitchen esomeprazole (NEXIUM) 40 MG capsule Take 40 mg by mouth daily before breakfast.        . fexofenadine (ALLEGRA) 180 MG tablet Take 180 mg by mouth daily.        . Fluticasone-Salmeterol (ADVAIR DISKUS) 250-50 MCG/DOSE AEPB Inhale 2 puffs into the lungs daily.        . isosorbide mononitrate (IMDUR) 30 MG 24 hr tablet Take 1 tablet (30 mg total) by mouth daily.  30 tablet  11  . lisinopril-hydrochlorothiazide (PRINZIDE,ZESTORETIC) 20-25 MG per tablet Take 1 tablet by mouth daily.       . montelukast (SINGULAIR) 10 MG tablet Take 1 tablet (10 mg total) by mouth at bedtime.  30 tablet  0    Allergies  Allergen Reactions  . Morphine     REACTION: Anaphlactic shock    Past Medical History  Diagnosis Date  . Coronary artery disease  s/p Rotablator to mid RCA in 2/12  . Weakness   . Fatigue   . GERD (gastroesophageal reflux disease)   . Dyslipidemia   . Hypertension   . Varicose vein   . PONV (postoperative nausea and vomiting)     Past Surgical History  Procedure Date  . Coronary angioplasty 2/12    Rotablator to the RCA  . Abdominal hysterectomy 1996    total w/BSO  . Orif tibia & fibula fractures 2000  . Hardware removal 02/21/2011    Procedure: HARDWARE REMOVAL;  Surgeon: Illene Labrador Aplington;  Location: [facility];  Service: Orthopedics;  Laterality: Left;  REMOVAL TWO SCREWS DISTAL LEFT TIBIA     History  Smoking status  . Never Smoker   Smokeless tobacco  . Not on file    History  Alcohol Use No    Family History  Problem Relation Age of Onset  . Heart attack  Mother   . Heart attack Father   . Heart attack Sister   . Heart attack Brother   . Heart attack Brother   . Heart attack Brother     Review of Systems: The review of systems is per the HPI.  All other systems were reviewed and are negative.  Physical Exam: BP 120/88  Pulse 68  Ht 5\' 6"  (1.676 m)  Wt 183 lb 12.8 oz (83.371 kg)  BMI 29.67 kg/m2 Patient is very pleasant and in no acute distress. Skin is warm and dry. Color is normal.  HEENT is unremarkable. Normocephalic/atraumatic. PERRL. Sclera are nonicteric. Neck is supple. No masses. No JVD. Lungs are clear. Cardiac exam shows a regular rate and rhythm. Abdomen is soft. Extremities are full without edema. Her left leg incisions look good. Gait and ROM are intact. No gross neurologic deficits noted.   LABORATORY DATA:   Records from [place] are reviewed.   Her echo was TDS but with mild MR, EF 60%.  Negative CT head and negative CT of the chest for PE noted.    Assessment / Plan:  1. Syncope - recurrent - no clear cut etiology. Will place an event monitor today. She is instructed to not drive.   2. Chest pain and shortness of breath - has known CAD with prior PCI - we will go ahead and arrange for repeat cardiac catheterization.   3. 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 Labile HTN - will check abdominal aortagram as well.   5. Known CAD  Her cath is set up for this Thursday. The procedure and been reviewed with her and her daughter and she is willing to proceed.   Patient is agreeable to this plan and will call if any problems develop in the interim.    Patient is agreeable to this plan and will call if any problems develop in the interim.

## 2012-02-11 ENCOUNTER — Encounter (HOSPITAL_COMMUNITY): Payer: Self-pay | Admitting: Pharmacy Technician

## 2012-02-12 ENCOUNTER — Encounter (HOSPITAL_COMMUNITY)
Admission: RE | Disposition: A | Discharge status: Home / Self Care | Payer: Self-pay | Source: Ambulatory Visit | Attending: Cardiology

## 2012-02-12 ENCOUNTER — Ambulatory Visit (HOSPITAL_COMMUNITY)
Admission: RE | Admit: 2012-02-12 | Discharge: 2012-02-12 | Disposition: A | Discharge status: Home / Self Care | Payer: Medicare Other | Source: Ambulatory Visit | Attending: Cardiology | Admitting: Cardiology

## 2012-02-12 ENCOUNTER — Ambulatory Visit: Payer: Medicare Other | Admitting: Cardiology

## 2012-02-12 DIAGNOSIS — Z01810 Encounter for preprocedural cardiovascular examination: Secondary | ICD-10-CM

## 2012-02-12 DIAGNOSIS — I2510 Atherosclerotic heart disease of native coronary artery without angina pectoris: Secondary | ICD-10-CM

## 2012-02-12 DIAGNOSIS — I739 Peripheral vascular disease, unspecified: Secondary | ICD-10-CM

## 2012-02-12 DIAGNOSIS — I252 Old myocardial infarction: Secondary | ICD-10-CM | POA: Insufficient documentation

## 2012-02-12 DIAGNOSIS — R0602 Shortness of breath: Secondary | ICD-10-CM | POA: Insufficient documentation

## 2012-02-12 DIAGNOSIS — E785 Hyperlipidemia, unspecified: Secondary | ICD-10-CM | POA: Insufficient documentation

## 2012-02-12 DIAGNOSIS — I10 Essential (primary) hypertension: Secondary | ICD-10-CM | POA: Insufficient documentation

## 2012-02-12 DIAGNOSIS — R55 Syncope and collapse: Secondary | ICD-10-CM | POA: Insufficient documentation

## 2012-02-12 DIAGNOSIS — Z9861 Coronary angioplasty status: Secondary | ICD-10-CM | POA: Insufficient documentation

## 2012-02-12 DIAGNOSIS — Z79899 Other long term (current) drug therapy: Secondary | ICD-10-CM | POA: Insufficient documentation

## 2012-02-12 DIAGNOSIS — Z86718 Personal history of other venous thrombosis and embolism: Secondary | ICD-10-CM | POA: Insufficient documentation

## 2012-02-12 HISTORY — PX: LEFT HEART CATHETERIZATION WITH CORONARY ANGIOGRAM: SHX5451

## 2012-02-12 HISTORY — PX: ABDOMINAL ANGIOGRAM: SHX5499

## 2012-02-12 SURGERY — LEFT HEART CATHETERIZATION WITH CORONARY ANGIOGRAM
Anesthesia: LOCAL

## 2012-02-12 MED ORDER — DIAZEPAM 5 MG PO TABS
5.0000 mg | ORAL_TABLET | ORAL | Status: AC
  Administered 2012-02-12: 5 mg via ORAL
  Filled 2012-02-12: qty 1

## 2012-02-12 MED ORDER — VERAPAMIL HCL 2.5 MG/ML IV SOLN
INTRAVENOUS | Status: AC
  Filled 2012-02-12: qty 2

## 2012-02-12 MED ORDER — SODIUM CHLORIDE 0.9 % IV SOLN: 250.0000 mL | INTRAVENOUS | Status: DC | PRN

## 2012-02-12 MED ORDER — HEPARIN (PORCINE) IN NACL 2-0.9 UNIT/ML-% IJ SOLN
INTRAMUSCULAR | Status: AC
  Filled 2012-02-12: qty 1000

## 2012-02-12 MED ORDER — SODIUM CHLORIDE 0.9 % IJ SOLN: 3.0000 mL | INTRAMUSCULAR | Status: DC | PRN

## 2012-02-12 MED ORDER — NITROGLYCERIN 0.2 MG/ML ON CALL CATH LAB
INTRAVENOUS | Status: AC
  Filled 2012-02-12: qty 1

## 2012-02-12 MED ORDER — SODIUM CHLORIDE 0.9 % IV SOLN: 1.0000 mL/kg/h | INTRAVENOUS | Status: DC

## 2012-02-12 MED ORDER — SODIUM CHLORIDE 0.9 % IV SOLN
INTRAVENOUS | Status: DC
  Administered 2012-02-12: 12:00:00 via INTRAVENOUS

## 2012-02-12 MED ORDER — FENTANYL CITRATE 0.05 MG/ML IJ SOLN
INTRAMUSCULAR | Status: AC
  Filled 2012-02-12: qty 2

## 2012-02-12 MED ORDER — MIDAZOLAM HCL 2 MG/2ML IJ SOLN
INTRAMUSCULAR | Status: AC
  Filled 2012-02-12: qty 2

## 2012-02-12 MED ORDER — SODIUM CHLORIDE 0.9 % IJ SOLN: 3.0000 mL | Freq: Two times a day (BID) | INTRAMUSCULAR | Status: DC

## 2012-02-12 MED ORDER — HEPARIN SODIUM (PORCINE) 1000 UNIT/ML IJ SOLN
INTRAMUSCULAR | Status: AC
  Filled 2012-02-12: qty 1

## 2012-02-12 MED ORDER — LIDOCAINE HCL (PF) 1 % IJ SOLN
INTRAMUSCULAR | Status: AC
  Filled 2012-02-12: qty 30

## 2012-02-12 MED ORDER — ASPIRIN 81 MG PO CHEW
324.0000 mg | CHEWABLE_TABLET | ORAL | Status: AC
  Administered 2012-02-12: 324 mg via ORAL
  Filled 2012-02-12: qty 4

## 2012-02-12 MED ORDER — ONDANSETRON HCL 4 MG/2ML IJ SOLN: 4.0000 mg | Freq: Four times a day (QID) | INTRAMUSCULAR | Status: DC | PRN

## 2012-02-12 MED ORDER — ACETAMINOPHEN 325 MG PO TABS: 650.0000 mg | ORAL_TABLET | ORAL | Status: DC | PRN

## 2012-02-12 NOTE — CV Procedure (Signed)
   Cardiac Catheterization Procedure Note  Name: Lindsey Middleton MRN: [id number] DOB: [birth year]  Procedure: Left Heart Cath, Selective Coronary Angiography, LV angiography  Indication: 71 year old white female status post stenting of the mid RCA in 2012 presents with symptoms of recurrent chest pain. She also has had labile hypertension with symptoms of syncope.   Procedural Details: The right wrist was prepped, draped, and anesthetized with 1% lidocaine. Using the modified Seldinger technique, a 5 French sheath was introduced into the right radial artery. 3 mg of verapamil was administered through the sheath, weight-based unfractionated heparin was administered intravenously. Standard Judkins catheters were used for selective coronary angiography and left ventriculography. Catheter exchanges were performed over an exchange length guidewire. There were no immediate procedural complications. A TR band was used for radial hemostasis at the completion of the procedure.  The patient was transferred to the post catheterization recovery area for further monitoring.  Procedural Findings: Hemodynamics: AO 133/77 with a mean of 103 mmHg LV 132/11 mmHg  Coronary angiography: Coronary dominance: right  Left mainstem: Normal.  Left anterior descending (LAD): The LAD is a large vessel that gives rise to a moderately large diagonal branch. There is 30% narrowing in the proximal LAD followed by a segment of ectasia. In the mid LAD there is a 50% stenosis. There is moderate calcification.  Left circumflex (LCx): The left circumflex is a large and very ectatic vessel. It gives rise to 3 marginal branches. In the distal circumflex prior to the terminal marginal branch there is mild disease up to 30%.  Right coronary artery (RCA): The right coronary is a dominant vessel. There is a 40% stenosis in the proximal vessel. The stent in the mid vessel is widely patent. No other significant disease is noted.  Left  ventriculography: Left ventricular systolic function is normal, LVEF is estimated at 55-65%, there is no significant mitral regurgitation , the aortic root is mildly dilated.  Abdominal aortography demonstrates normal aortic caliber without aneurysm. The iliac vessels are tortuous. The mesenteric vessels appear normal. The right kidney has 2 renal arteries that are widely patent. The left kidney supplied by single vessel that bifurcates. There is no stenosis.  Final Conclusions:   1. Nonobstructive coronary disease. Continued patency of the stent in the mid RCA. 2. Normal LV function. 3. Normal abdominal aorta. No renal artery stenosis.  Recommendations: Continue medical management.  Theron Arista [facility] 02/12/2012, 1:14 PM

## 2012-02-12 NOTE — H&P (View-Only) (Signed)
 Lindsey Middleton Date of Birth: 12/07/1940 Medical Record #[id number]  History of Present Illness: Lindsey Middleton is seen back today for a work in visit. She is seen for Dr. Brackbill. She has multiple issues which include known ischemic heart disease, remote MI in 1986prior rotablator to the mid RCA in February of 2012, followed by placement of DES to the mid RCA. Her other issues include GERD, HLD, asthma, and varicose veins with past DVT.   She was last here in April, felt to be doing well.   Was most recently admitted to the hospital in [place]. She had undergone endovenous RF ablation of the left SVG. Apparently having PVCs during the procedure.  Did fine right after the procedure. Just prior to leaving, had an episode of syncope. One of the nurses apparently caught her. She did not hit her head. She had gotten weak and clammy. She did not have any associated chest pain or prodome but endorses at least a 6 month history of worsening dyspnea, increased orthopnea, chest pain and palpitations with at least 4 to 6 episodes of syncope over the past 6 months - has not called to report this to us. Cardiac cath was recommended but they opted to come back to [place] and follow up with Dr. Brackbill. Her blood pressure medicines had just been changed prior to this vein procedure in which HCTZ had been added. She had had dose of her medicine the night before as well as the morning of the procedure and had not eaten. She is under a lot of stress with her husband who has dementia.   She comes in today. She is here with her daughter. She has been home since Saturday. Has continued to have chest pain and is short of breath. Has still felt very presyncopal. She is still driving. Also reports that her blood pressure has "been all over the place" with readings over 200 systolic and then down to 120's.   Current Outpatient Prescriptions on File Prior to Visit  Medication Sig Dispense Refill  . albuterol (PROVENTIL) (2.5  MG/3ML) 0.083% nebulizer solution Take 2.5 mg by nebulization every 6 (six) hours as needed. FOR SHORTNESS OF BREATH OR WHEEZING      . amLODipine (NORVASC) 5 MG tablet Take 1 tablet (5 mg total) by mouth 2 (two) times daily.  30 tablet  11  . aspirin 325 MG tablet Take 325 mg by mouth daily.        . Cholecalciferol (VITAMIN D PO) Take 1 tablet by mouth every 14 (fourteen) days.       . esomeprazole (NEXIUM) 40 MG capsule Take 40 mg by mouth daily before breakfast.        . fexofenadine (ALLEGRA) 180 MG tablet Take 180 mg by mouth daily.        . Fluticasone-Salmeterol (ADVAIR DISKUS) 250-50 MCG/DOSE AEPB Inhale 2 puffs into the lungs daily.        . isosorbide mononitrate (IMDUR) 30 MG 24 hr tablet Take 1 tablet (30 mg total) by mouth daily.  30 tablet  11  . lisinopril-hydrochlorothiazide (PRINZIDE,ZESTORETIC) 20-25 MG per tablet Take 1 tablet by mouth daily.       . montelukast (SINGULAIR) 10 MG tablet Take 1 tablet (10 mg total) by mouth at bedtime.  30 tablet  0    Allergies  Allergen Reactions  . Morphine     REACTION: Anaphlactic shock    Past Medical History  Diagnosis Date  . Coronary artery disease       s/p Rotablator to mid RCA in 2/12  . Weakness   . Fatigue   . GERD (gastroesophageal reflux disease)   . Dyslipidemia   . Hypertension   . Varicose vein   . PONV (postoperative nausea and vomiting)     Past Surgical History  Procedure Date  . Coronary angioplasty 2/12    Rotablator to the RCA  . Abdominal hysterectomy 1996    total w/BSO  . Orif tibia & fibula fractures 2000  . Hardware removal 02/21/2011    Procedure: HARDWARE REMOVAL;  Surgeon: James P Aplington;  Location: [facility];  Service: Orthopedics;  Laterality: Left;  REMOVAL TWO SCREWS DISTAL LEFT TIBIA     History  Smoking status  . Never Smoker   Smokeless tobacco  . Not on file    History  Alcohol Use No    Family History  Problem Relation Age of Onset  . Heart attack  Mother   . Heart attack Father   . Heart attack Sister   . Heart attack Brother   . Heart attack Brother   . Heart attack Brother     Review of Systems: The review of systems is per the HPI.  All other systems were reviewed and are negative.  Physical Exam: BP 120/88  Pulse 68  Ht 5' 6" (1.676 m)  Wt 183 lb 12.8 oz (83.371 kg)  BMI 29.67 kg/m2 Patient is very pleasant and in no acute distress. Skin is warm and dry. Color is normal.  HEENT is unremarkable. Normocephalic/atraumatic. PERRL. Sclera are nonicteric. Neck is supple. No masses. No JVD. Lungs are clear. Cardiac exam shows a regular rate and rhythm. Abdomen is soft. Extremities are full without edema. Her left leg incisions look good. Gait and ROM are intact. No gross neurologic deficits noted.   LABORATORY DATA:   Records from [place] are reviewed.   Her echo was TDS but with mild MR, EF 60%.  Negative CT head and negative CT of the chest for PE noted.    Assessment / Plan:  1. Syncope - recurrent - no clear cut etiology. Will place an event monitor today. She is instructed to not drive.   2. Chest pain and shortness of breath - has known CAD with prior PCI - we will go ahead and arrange for repeat cardiac catheterization.   3. 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 Labile HTN - will check abdominal aortagram as well.   5. Known CAD  Her cath is set up for this Thursday. The procedure and been reviewed with her and her daughter and she is willing to proceed.   Patient is agreeable to this plan and will call if any problems develop in the interim.    Patient is agreeable to this plan and will call if any problems develop in the interim.   

## 2012-02-12 NOTE — Interval H&P Note (Signed)
History and Physical Interval Note:  02/12/2012 12:28 PM  Lindsey Middleton  has presented today for surgery, with the diagnosis of c/p  The various methods of treatment have been discussed with the patient and family. After consideration of risks, benefits and other options for treatment, the patient has consented to  Procedure(s) (LRB) with comments: LEFT HEART CATHETERIZATION WITH CORONARY ANGIOGRAM (N/A) as a surgical intervention .  The patient's history has been reviewed, patient examined, no change in status, stable for surgery.  I have reviewed the patient's chart and labs.  Questions were answered to the patient's satisfaction.     Theron Arista [facility] 02/12/2012 12:28 PM

## 2012-02-16 ENCOUNTER — Encounter: Payer: Self-pay | Admitting: Nurse Practitioner

## 2012-03-01 ENCOUNTER — Encounter: Payer: Self-pay | Admitting: Nurse Practitioner

## 2012-03-09 ENCOUNTER — Ambulatory Visit (INDEPENDENT_AMBULATORY_CARE_PROVIDER_SITE_OTHER): Payer: Medicare Other | Admitting: Cardiology

## 2012-03-09 ENCOUNTER — Encounter: Payer: Self-pay | Admitting: Cardiology

## 2012-03-09 VITALS — BP 128/80 | HR 79 | Ht 66.0 in | Wt 185.0 lb

## 2012-03-09 DIAGNOSIS — I2510 Atherosclerotic heart disease of native coronary artery without angina pectoris: Secondary | ICD-10-CM

## 2012-03-09 DIAGNOSIS — I10 Essential (primary) hypertension: Secondary | ICD-10-CM

## 2012-03-09 DIAGNOSIS — R079 Chest pain, unspecified: Secondary | ICD-10-CM

## 2012-03-09 DIAGNOSIS — R55 Syncope and collapse: Secondary | ICD-10-CM

## 2012-03-09 NOTE — Progress Notes (Signed)
Lindsey Middleton Date of Birth: [birth year] Medical Record #[id number]  History of Present Illness: Lindsey Middleton is seen for follow up after recent cardiac cath. This showed nonobstructive CAD with a patent stent in the RCA. LV function was normal. Renal arteries were patent. She has had no further chest pain. No complications at radial access. No further syncope.  Current Outpatient Prescriptions on File Prior to Visit  Medication Sig Dispense Refill  . albuterol (PROVENTIL) (2.5 MG/3ML) 0.083% nebulizer solution Take 2.5 mg by nebulization every 6 (six) hours as needed. For shortness of breath      . amLODipine (NORVASC) 5 MG tablet Take 5 mg by mouth 2 (two) times daily.      Marland Kitchen aspirin 325 MG tablet Take 325 mg by mouth daily.        . Cholecalciferol (VITAMIN D PO) Take 1 tablet by mouth daily.       Marland Kitchen esomeprazole (NEXIUM) 40 MG capsule Take 40 mg by mouth daily before breakfast.        . Fexofenadine HCl (ALLEGRA PO) Take 1 tablet by mouth daily.      . Fluticasone-Salmeterol (ADVAIR DISKUS) 250-50 MCG/DOSE AEPB Inhale 2 puffs into the lungs daily.       Marland Kitchen lisinopril-hydrochlorothiazide (PRINZIDE,ZESTORETIC) 20-25 MG per tablet Take 1 tablet by mouth daily.       . metoprolol succinate (TOPROL-XL) 100 MG 24 hr tablet Take 50-100 mg by mouth 2 (two) times daily. Takes 1 tablets every morning and takes  tablet every night at bedtime.      . montelukast (SINGULAIR) 10 MG tablet Take 1 tablet (10 mg total) by mouth at bedtime.  30 tablet  0  . nitroGLYCERIN (NITROSTAT) 0.4 MG SL tablet Place 0.4 mg under the tongue every 5 (five) minutes as needed. For chest pain        Allergies  Allergen Reactions  . Morphine Anaphylaxis    Past Medical History  Diagnosis Date  . Coronary artery disease     s/p Rotablator to mid RCA in 2/12  . Weakness   . Fatigue   . GERD (gastroesophageal reflux disease)   . Dyslipidemia   . Hypertension   . Varicose vein   . PONV (postoperative nausea and  vomiting)     Past Surgical History  Procedure Date  . Coronary angioplasty 2/12    Rotablator to the RCA  . Abdominal hysterectomy 1996    total w/BSO  . Orif tibia & fibula fractures 2000  . Hardware removal 02/21/2011    Procedure: HARDWARE REMOVAL;  Surgeon: Illene Labrador Aplington;  Location: [facility];  Service: Orthopedics;  Laterality: Left;  REMOVAL TWO SCREWS DISTAL LEFT TIBIA     History  Smoking status  . Never Smoker   Smokeless tobacco  . Not on file    History  Alcohol Use No    Family History  Problem Relation Age of Onset  . Heart attack Mother   . Heart attack Father   . Heart attack Sister   . Heart attack Brother   . Heart attack Brother   . Heart attack Brother     Review of Systems: As noted in HPI.  All other systems were reviewed and are negative.  Physical Exam: BP 128/80  Pulse 79  Ht 5\' 6"  (1.676 m)  Wt 185 lb (83.915 kg)  BMI 29.86 kg/m2 Pleasant WF in NAD HEENT is normal. No JVD or bruits. Lungs clear. CV RRR  without M,R, gallop Abdomen soft and NT. No radial hematoma. Pulses 2+ Neuro nonfocal.  LABORATORY DATA:   Assessment / Plan: 1.CAD s/p stent of RCA- cath shows nonobstructive disease. Continue medical therapy. Follow up with Dr. Patty Sermons in 6 months.  2. Syncope etiology unclear. Event monitor on now but no significant events so far.  3. HTN labile currently under good control.

## 2012-03-09 NOTE — Patient Instructions (Signed)
Continue your current therapy  I will schedule you a visit with Dr. Patty Sermons in 6 months.

## 2012-05-28 ENCOUNTER — Other Ambulatory Visit: Payer: Self-pay | Admitting: Obstetrics & Gynecology

## 2012-05-28 DIAGNOSIS — N6310 Unspecified lump in the right breast, unspecified quadrant: Secondary | ICD-10-CM

## 2012-07-28 ENCOUNTER — Telehealth: Payer: Self-pay | Admitting: Cardiology

## 2012-07-28 MED ORDER — ALPRAZOLAM 0.25 MG PO TABS: ORAL_TABLET | ORAL | Status: DC

## 2012-07-28 NOTE — Telephone Encounter (Signed)
Called patient's daughter Silva Bandy to inform her of Dr. Yevonne Pax order for Xanax that I phoned into the [facility] on [address] In [place], [place].  Kristi verbalized understanding.

## 2012-07-28 NOTE — Telephone Encounter (Signed)
Okay for xanax 0.25 mg one BID prn # 60 refill x 3

## 2012-07-28 NOTE — Telephone Encounter (Signed)
Left pt's daughter a message to call back.

## 2012-07-28 NOTE — Telephone Encounter (Signed)
New Problem  Daughter states dad has developed dementia and mom is not doing well with it. She wants to know if Dr Patty Sermons can prescribe some anxiety medication for Lindsey Middleton.

## 2012-07-28 NOTE — Telephone Encounter (Signed)
Spoke with pt's daughter.  She is calling to see if Dr. Patty Sermons would prescribe medication for pt's anxiety.  Pt's husband has dementia and this is very stressful for pt. Pt does not have primary MD.  Has not been on any medications in the past for anxiety.

## 2012-08-09 ENCOUNTER — Other Ambulatory Visit: Payer: Medicare Other

## 2012-08-19 ENCOUNTER — Ambulatory Visit
Admission: RE | Admit: 2012-08-19 | Discharge: 2012-08-19 | Disposition: A | Discharge status: Home / Self Care | Payer: Medicare Other | Source: Ambulatory Visit | Attending: Obstetrics & Gynecology | Admitting: Obstetrics & Gynecology

## 2012-08-19 DIAGNOSIS — N6310 Unspecified lump in the right breast, unspecified quadrant: Secondary | ICD-10-CM

## 2012-09-20 ENCOUNTER — Ambulatory Visit (INDEPENDENT_AMBULATORY_CARE_PROVIDER_SITE_OTHER): Payer: Medicare Other | Admitting: Cardiology

## 2012-09-20 ENCOUNTER — Encounter: Payer: Self-pay | Admitting: Cardiology

## 2012-09-20 VITALS — BP 140/84 | HR 68 | Ht 66.0 in | Wt 186.8 lb

## 2012-09-20 DIAGNOSIS — R5383 Other fatigue: Secondary | ICD-10-CM

## 2012-09-20 DIAGNOSIS — R079 Chest pain, unspecified: Secondary | ICD-10-CM

## 2012-09-20 DIAGNOSIS — R5381 Other malaise: Secondary | ICD-10-CM

## 2012-09-20 DIAGNOSIS — I259 Chronic ischemic heart disease, unspecified: Secondary | ICD-10-CM

## 2012-09-20 DIAGNOSIS — R55 Syncope and collapse: Secondary | ICD-10-CM

## 2012-09-20 DIAGNOSIS — R531 Weakness: Secondary | ICD-10-CM

## 2012-09-20 MED ORDER — NITROGLYCERIN 0.4 MG SL SUBL: 0.4000 mg | SUBLINGUAL_TABLET | SUBLINGUAL | Status: DC | PRN

## 2012-09-20 NOTE — Assessment & Plan Note (Signed)
Patient has had no further episodes of syncope

## 2012-09-20 NOTE — Progress Notes (Signed)
Worthy Flank Date of Birth:  [birth year] [facility] [address] [place], [place]  [address] [phone]  Fax   [phone]  HPI: This pleasant 72 year old woman is seen for a scheduled 6 month followup office visit.  She has a past history of known ischemic heart disease.She has multiple issues which include known ischemic heart disease, remote MI in 1986prior rotablator to the mid RCA in February of 2012, followed by placement of DES to the mid RCA.  Her last cardiac catheterization was by Dr. Peter Swaziland on 02/12/12 at which time he found nonobstructive coronary disease and her previous stent was widely patent.  She had normal LV function.  She had a normal abdominal aorta and no evidence of renal artery stenosis. Her other issues include GERD, HLD, asthma, and varicose veins with past DVT.  Since last visit she has been feeling well.  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  She is under a lot of stress because of her husband who has dementia.   Current Outpatient Prescriptions  Medication Sig Dispense Refill  . albuterol (PROVENTIL) (2.5 MG/3ML) 0.083% nebulizer solution Take 2.5 mg by nebulization every 6 (six) hours as needed. For shortness of breath      . ALPRAZolam (XANAX) 0.25 MG tablet Take one tab up to two times per day as needed  60 tablet  3  . amLODipine (NORVASC) 5 MG tablet Take 5 mg by mouth daily.       Marland Kitchen aspirin 325 MG tablet Take 325 mg by mouth daily.        . Cholecalciferol (VITAMIN D PO) Take 1 tablet by mouth daily.       Marland Kitchen esomeprazole (NEXIUM) 40 MG capsule Take 40 mg by mouth daily before breakfast.        . Fexofenadine HCl (ALLEGRA PO) Take 1 tablet by mouth daily.      . Fluticasone-Salmeterol (ADVAIR DISKUS) 250-50 MCG/DOSE AEPB Inhale 2 puffs into the lungs daily.       Marland Kitchen lisinopril-hydrochlorothiazide (PRINZIDE,ZESTORETIC) 20-25 MG per tablet Take 1 tablet by mouth daily.       . metoprolol succinate  (TOPROL-XL) 100 MG 24 hr tablet Take 50-100 mg by mouth 2 (two) times daily. Takes 1 tablets every morning and takes  tablet every night at bedtime.      . montelukast (SINGULAIR) 10 MG tablet Take 1 tablet (10 mg total) by mouth at bedtime.  30 tablet  0  . nitroGLYCERIN (NITROSTAT) 0.4 MG SL tablet Place 1 tablet (0.4 mg total) under the tongue every 5 (five) minutes as needed. For chest pain  25 tablet  3  . Vitamin D, Ergocalciferol, (DRISDOL) 50000 UNITS CAPS Take 50,000 Units by mouth every 7 (seven) days.       No current facility-administered medications for this visit.    Allergies  Allergen Reactions  . Morphine Anaphylaxis    Patient Active Problem List   Diagnosis Date Noted  . Chest pain 09/20/2012  . Syncope 02/09/2012  . Edema 12/02/2010  . Coronary artery disease   . Chest pain   . Weakness   . Fatigue   . GERD (gastroesophageal reflux disease)   . Dyslipidemia   . Hypertension   . Malaise   . Ischemic heart disease   . HYPERLIPIDEMIA 03/25/2010  . HYPERTENSION 03/25/2010  . ASTHMA 03/25/2010  . CHEST PAIN UNSPECIFIED 03/25/2010  . NONSPECIFIC ABNORMAL UNSPEC CV FUNCTION STUDY 03/25/2010  History  Smoking status  . Never Smoker   Smokeless tobacco  . Not on file    History  Alcohol Use No    Family History  Problem Relation Age of Onset  . Heart attack Mother   . Heart attack Father   . Heart attack Sister   . Heart attack Brother   . Heart attack Brother   . Heart attack Brother     Review of Systems: The patient denies any heat or cold intolerance.  No weight gain or weight loss.  The patient denies headaches or blurry vision.  There is no cough or sputum production.  The patient denies dizziness.  There is no hematuria or hematochezia.  The patient denies any muscle aches or arthritis.  The patient denies any rash.  The patient denies frequent falling or instability.  There is no history of depression or anxiety.  All other systems were  reviewed and are negative.   Physical Exam: Filed Vitals:   09/20/12 1056  BP: 140/84  Pulse: 68   the general appearance reveals a well-developed well-nourished woman in no distress.The head and neck exam reveals pupils equal and reactive.  Extraocular movements are full.  There is no scleral icterus.  The mouth and pharynx are normal.  The neck is supple.  The carotids reveal no bruits.  The jugular venous pressure is normal.  The  thyroid is not enlarged.  There is no lymphadenopathy.  The chest is clear to percussion and auscultation.  There are no rales or rhonchi.  Expansion of the chest is symmetrical.  The precordium is quiet.  The first heart sound is normal.  The second heart sound is physiologically split.  There is no murmur gallop rub or click.  There is no abnormal lift or heave.  The abdomen is soft and nontender.  The bowel sounds are normal.  The liver and spleen are not enlarged.  There are no abdominal masses.  There are no abdominal bruits.  Extremities reveal good pedal pulses.  There is no phlebitis or edema.  There is no cyanosis or clubbing.  Strength is normal and symmetrical in all extremities.  There is no lateralizing weakness.  There are no sensory deficits.  The skin is warm and dry.  There is no rash.  EKG today shows normal sinus rhythm and no ischemic changes.    Assessment / Plan: Continue on same medication.  Recheck in 6 months for followup office visit

## 2012-09-20 NOTE — Assessment & Plan Note (Signed)
Patient has weakness and malaise times which she indicates is secondary to a lot of stress.  She has generic Xanax on hand for as needed use

## 2012-09-20 NOTE — Patient Instructions (Signed)
Your physician recommends that you continue on your current medications as directed. Please refer to the Current Medication list given to you today.  Your physician wants you to follow-up in: 6 months with Dr. Brackbill.  You will receive a reminder letter in the mail two months in advance. If you don't receive a letter, please call our office to schedule the follow-up appointment.  

## 2012-09-20 NOTE — Assessment & Plan Note (Signed)
The patient has not been experiencing any recurrent severe chest pain.  She has occasional mild pressure.  We will send her a new prescription for nitroglycerin sublingually to have on hand.

## 2012-12-16 ENCOUNTER — Other Ambulatory Visit: Payer: Self-pay

## 2012-12-16 DIAGNOSIS — Z1231 Encounter for screening mammogram for malignant neoplasm of breast: Secondary | ICD-10-CM

## 2012-12-17 ENCOUNTER — Encounter: Payer: Self-pay | Admitting: Cardiology

## 2013-01-13 ENCOUNTER — Telehealth: Payer: Self-pay | Admitting: Cardiology

## 2013-01-13 NOTE — Telephone Encounter (Signed)
Spoke with patient and per  Dr. Patty Sermons recommending Dr Marina Goodell

## 2013-01-13 NOTE — Telephone Encounter (Signed)
New Problem:  Pt states she is calling to see if Dr. Patty Sermons could recommend a GI doctor for her. Pt states she has been many stomach issues.

## 2013-01-24 ENCOUNTER — Ambulatory Visit
Admission: RE | Admit: 2013-01-24 | Discharge: 2013-01-24 | Disposition: A | Discharge status: Home / Self Care | Payer: Medicare Other | Source: Ambulatory Visit

## 2013-01-24 ENCOUNTER — Ambulatory Visit: Payer: Medicare Other

## 2013-01-24 DIAGNOSIS — Z1231 Encounter for screening mammogram for malignant neoplasm of breast: Secondary | ICD-10-CM

## 2013-01-26 ENCOUNTER — Other Ambulatory Visit: Payer: Self-pay | Admitting: Obstetrics & Gynecology

## 2013-01-26 DIAGNOSIS — R928 Other abnormal and inconclusive findings on diagnostic imaging of breast: Secondary | ICD-10-CM

## 2013-01-31 ENCOUNTER — Ambulatory Visit
Admission: RE | Admit: 2013-01-31 | Discharge: 2013-01-31 | Disposition: A | Discharge status: Home / Self Care | Payer: Medicare Other | Source: Ambulatory Visit | Attending: Obstetrics & Gynecology | Admitting: Obstetrics & Gynecology

## 2013-01-31 DIAGNOSIS — R928 Other abnormal and inconclusive findings on diagnostic imaging of breast: Secondary | ICD-10-CM

## 2013-02-07 ENCOUNTER — Telehealth: Payer: Self-pay | Admitting: Cardiology

## 2013-02-07 NOTE — Telephone Encounter (Signed)
New message    Want Lindsey Middleton to know that she has been diagnosis with diverticulitis.  She talked to you about her abd pain

## 2013-02-08 NOTE — Telephone Encounter (Signed)
Left message to call back  

## 2013-02-09 ENCOUNTER — Other Ambulatory Visit: Payer: Self-pay | Admitting: Cardiology

## 2013-02-10 ENCOUNTER — Other Ambulatory Visit: Payer: Self-pay | Admitting: *Deleted

## 2013-02-10 MED ORDER — ALPRAZOLAM 0.25 MG PO TABS: ORAL_TABLET | ORAL | Status: DC

## 2013-02-10 NOTE — Telephone Encounter (Signed)
Spoke with patient and she is on medication for her diverticulitis. Information only

## 2013-02-15 ENCOUNTER — Other Ambulatory Visit: Payer: Self-pay | Admitting: *Deleted

## 2013-02-15 DIAGNOSIS — I83893 Varicose veins of bilateral lower extremities with other complications: Secondary | ICD-10-CM

## 2013-03-22 ENCOUNTER — Encounter: Payer: Self-pay | Admitting: Vascular Surgery

## 2013-03-23 ENCOUNTER — Encounter: Payer: Self-pay | Admitting: Vascular Surgery

## 2013-03-23 ENCOUNTER — Ambulatory Visit (INDEPENDENT_AMBULATORY_CARE_PROVIDER_SITE_OTHER): Payer: Medicare Other | Admitting: Cardiology

## 2013-03-23 ENCOUNTER — Ambulatory Visit (INDEPENDENT_AMBULATORY_CARE_PROVIDER_SITE_OTHER): Payer: Medicare Other | Admitting: Vascular Surgery

## 2013-03-23 ENCOUNTER — Ambulatory Visit (HOSPITAL_COMMUNITY)
Admission: RE | Admit: 2013-03-23 | Discharge: 2013-03-23 | Disposition: A | Discharge status: Home / Self Care | Payer: Medicare Other | Source: Ambulatory Visit | Attending: Vascular Surgery | Admitting: Vascular Surgery

## 2013-03-23 ENCOUNTER — Encounter: Payer: Self-pay | Admitting: Cardiology

## 2013-03-23 VITALS — BP 128/76 | HR 60 | Ht 66.0 in | Wt 181.0 lb

## 2013-03-23 VITALS — BP 126/84 | HR 62 | Ht 66.0 in | Wt 179.0 lb

## 2013-03-23 DIAGNOSIS — I83893 Varicose veins of bilateral lower extremities with other complications: Secondary | ICD-10-CM

## 2013-03-23 DIAGNOSIS — I10 Essential (primary) hypertension: Secondary | ICD-10-CM

## 2013-03-23 DIAGNOSIS — I259 Chronic ischemic heart disease, unspecified: Secondary | ICD-10-CM

## 2013-03-23 DIAGNOSIS — I8390 Asymptomatic varicose veins of unspecified lower extremity: Secondary | ICD-10-CM | POA: Insufficient documentation

## 2013-03-23 DIAGNOSIS — E785 Hyperlipidemia, unspecified: Secondary | ICD-10-CM

## 2013-03-23 DIAGNOSIS — R55 Syncope and collapse: Secondary | ICD-10-CM

## 2013-03-23 NOTE — Assessment & Plan Note (Signed)
The patient has not been having any recurrent chest pain or angina.  She has not had to take any recent sublingual nitroglycerin.

## 2013-03-23 NOTE — Assessment & Plan Note (Signed)
The patient states that she has not had her lipids checked recently.  She has a class I indication for statin therapy because of her known ischemic heart disease.  When she returns in February with her husband that his visit she will come fasting at her request and we will check fasting lipid panel and chemistries on her that day.

## 2013-03-23 NOTE — Assessment & Plan Note (Signed)
The patient has had no recurrent syncopal episodes

## 2013-03-23 NOTE — Patient Instructions (Addendum)
Your physician recommends that you continue on your current medications as directed. Please refer to the Current Medication list given to you today.  Your physician wants you to follow-up in: 6 months OV EKG  You will receive a reminder letter in the mail two months in advance. If you don't receive a letter, please call our office to schedule the follow-up appointment.   RETURN IN FEB WITH YOUR HUSBAND FOR FASTING LABS

## 2013-03-23 NOTE — Assessment & Plan Note (Signed)
This patient does have varicose veins of the left lower extremity which are symptomatic.  We have discussed the importance of intermittent leg elevation and the proper positioning for this. I've also written her a prescription for compression stockings. Also discussed a water aerobics which I think also is helpful in patients with venous disease. I've encouraged her to avoid prolonged sitting and standing. Unfortunately she does stand quite a bit at work. If her symptoms progressed she could potentially be considered for laser ablation of her left small saphenous vein and stab avulsion of her varicosities. I'll see her back as needed.

## 2013-03-23 NOTE — Progress Notes (Signed)
Lindsey Middleton Date of Birth:  [birth year] [address] [place], [place]  [address] [phone]  Fax   [phone]  HPI: This pleasant 73 year old woman is seen for a scheduled 6 month followup office visit.  She has a past history of known ischemic heart disease.She has multiple issues which include known ischemic heart disease, remote MI in 1986 with prior rotablator to the mid RCA in February of 2012, followed by placement of DES to the mid RCA.  Her last cardiac catheterization was by Dr. Peter Martinique on 02/12/12 at which time he found nonobstructive coronary disease and her previous stent was widely patent.  She had normal LV function.  She had a normal abdominal aorta and no evidence of renal artery stenosis. Her other issues include GERD, HLD, asthma, and varicose veins with past DVT.  She has hypercholesterolemia and is on Lipitor uncertain dose monitored by her PCP. Since last visit she has been feeling well.    She is under a lot of stress because of her husband who has dementia.  She has been having problems with varicose veins and has a new appointment to see the vein and vascular surgical specialists here in [place] later today   Current Outpatient Prescriptions  Medication Sig Dispense Refill  . albuterol (PROVENTIL) (2.5 MG/3ML) 0.083% nebulizer solution Take 2.5 mg by nebulization every 6 (six) hours as needed. For shortness of breath      . ALPRAZolam (XANAX) 0.25 MG tablet Take one tab up to two times per day as needed  60 tablet  3  . amLODipine (NORVASC) 5 MG tablet Take 5 mg by mouth daily.       Marland Kitchen aspirin 325 MG tablet Take 325 mg by mouth daily.        Marland Kitchen atorvastatin (LIPITOR) 40 MG tablet Take 40 mg by mouth daily.      . Cholecalciferol (VITAMIN D PO) Take 1 tablet by mouth daily.       Marland Kitchen esomeprazole (NEXIUM) 40 MG capsule Take 40 mg by mouth daily before breakfast.        . Fexofenadine HCl (ALLEGRA PO) Take 1 tablet by mouth daily.      .  Fluticasone-Salmeterol (ADVAIR DISKUS) 250-50 MCG/DOSE AEPB Inhale 2 puffs into the lungs daily.       Marland Kitchen lisinopril-hydrochlorothiazide (PRINZIDE,ZESTORETIC) 20-25 MG per tablet Take 1 tablet by mouth daily.       . metoprolol succinate (TOPROL-XL) 100 MG 24 hr tablet Take 50-100 mg by mouth 2 (two) times daily. Takes 1 tablets every morning and takes  tablet every night at bedtime.      . montelukast (SINGULAIR) 10 MG tablet Take 1 tablet (10 mg total) by mouth at bedtime.  30 tablet  0  . nitroGLYCERIN (NITROSTAT) 0.4 MG SL tablet Place 1 tablet (0.4 mg total) under the tongue every 5 (five) minutes as needed. For chest pain  25 tablet  3  . Vitamin D, Ergocalciferol, (DRISDOL) 50000 UNITS CAPS Take 50,000 Units by mouth every 7 (seven) days.       No current facility-administered medications for this visit.    Allergies  Allergen Reactions  . Morphine Anaphylaxis    Patient Active Problem List   Diagnosis Date Noted  . Chest pain 09/20/2012  . Syncope 02/09/2012  . Edema 12/02/2010  . Coronary artery disease   . Chest pain   . Weakness   . Fatigue   . GERD (gastroesophageal reflux  disease)   . Dyslipidemia   . Hypertension   . Malaise   . Ischemic heart disease   . HYPERLIPIDEMIA 03/25/2010  . HYPERTENSION 03/25/2010  . ASTHMA 03/25/2010  . CHEST PAIN UNSPECIFIED 03/25/2010  . NONSPECIFIC ABNORMAL UNSPEC CV FUNCTION STUDY 03/25/2010    History  Smoking status  . Never Smoker   Smokeless tobacco  . Not on file    History  Alcohol Use No    Family History  Problem Relation Age of Onset  . Heart attack Mother   . Heart attack Father   . Heart attack Sister   . Heart attack Brother   . Heart attack Brother   . Heart attack Brother     Review of Systems: The patient denies any heat or cold intolerance.  No weight gain or weight loss.  The patient denies headaches or blurry vision.  There is no cough or sputum production.  The patient denies dizziness.  There  is no hematuria or hematochezia.  The patient denies any muscle aches or arthritis.  The patient denies any rash.  The patient denies frequent falling or instability.  There is no history of depression or anxiety.  All other systems were reviewed and are negative.   Physical Exam: Filed Vitals:   03/23/13 1124  BP: 128/76  Pulse: 60   the general appearance reveals a well-developed well-nourished woman in no distress.The head and neck exam reveals pupils equal and reactive.  Extraocular movements are full.  There is no scleral icterus.  The mouth and pharynx are normal.  The neck is supple.  The carotids reveal no bruits.  The jugular venous pressure is normal.  The  thyroid is not enlarged.  There is no lymphadenopathy.  The chest is clear to percussion and auscultation.  There are no rales or rhonchi.  Expansion of the chest is symmetrical.  The precordium is quiet.  The first heart sound is normal.  The second heart sound is physiologically split.  There is no murmur gallop rub or click.  There is no abnormal lift or heave.  The abdomen is soft and nontender.  The bowel sounds are normal.  The liver and spleen are not enlarged.  There are no abdominal masses.  There are no abdominal bruits.  Extremities reveal good pedal pulses.  There is no phlebitis or edema.  There is no cyanosis or clubbing.  Strength is normal and symmetrical in all extremities.  There is no lateralizing weakness.  There are no sensory deficits.  The skin is warm and dry.  There is no rash.      Assessment / Plan: Continue on same medication.  Recheck in 6 months for followup office visit and EKG. When her husband comes here in February, we will draw fasting lab work on her to update her lipid status.

## 2013-03-23 NOTE — Progress Notes (Signed)
[facility]  Patient name: Lindsey Middleton MRN: [id number] DOB: Sep 30, 1940 Sex: female  REASON FOR CONSULT: Evaluate varicose veins.  HPI: Lindsey Middleton is a 73 y.o. female who is had varicose veins for well over a year. She experiences significant aching pain and heaviness in her legs which is aggravated by standing and sitting. Her symptoms are worse on the left side. She underwent a previous ablation procedure of her left greater saphenous vein in [place]. She had syncope after the procedure and therefore she did not proceed with stab avulsion of varicose veins of her right leg.  She comes here for to be evaluated with continued varicose vein issues bilaterally but more significantly on the left side. She does have a remote history of a DVT in the left lower extremity.   Past Medical History  Diagnosis Date  . Coronary artery disease     s/p Rotablator to mid RCA in 2/12  . Weakness   . Fatigue   . GERD (gastroesophageal reflux disease)   . Dyslipidemia   . Hypertension   . Varicose vein   . PONV (postoperative nausea and vomiting)    Family History  Problem Relation Age of Onset  . Heart attack Mother   . Heart attack Father   . Heart attack Sister   . Heart attack Brother   . Heart attack Brother   . Heart attack Brother   . Cancer Daughter   . Heart disease Daughter   . Hyperlipidemia Daughter   . Varicose Veins Daughter   . Heart disease Son    SOCIAL HISTORY: History  Substance Use Topics  . Smoking status: Never Smoker   . Smokeless tobacco: Never Used  . Alcohol Use: No   Allergies  Allergen Reactions  . Morphine Anaphylaxis   Current Outpatient Prescriptions  Medication Sig Dispense Refill  . albuterol (PROVENTIL) (2.5 MG/3ML) 0.083% nebulizer solution Take 2.5 mg by nebulization every 6 (six) hours as needed. For shortness of breath      . ALPRAZolam (XANAX) 0.25 MG tablet Take one tab up to two times per day as needed  60  tablet  3  . amLODipine (NORVASC) 5 MG tablet Take 5 mg by mouth daily.       Marland Kitchen aspirin 325 MG tablet Take 325 mg by mouth daily.        Marland Kitchen atorvastatin (LIPITOR) 40 MG tablet Take 40 mg by mouth daily.      . Cholecalciferol (VITAMIN D PO) Take 1 tablet by mouth daily.       Marland Kitchen esomeprazole (NEXIUM) 40 MG capsule Take 40 mg by mouth daily before breakfast.        . Fexofenadine HCl (ALLEGRA PO) Take 1 tablet by mouth daily.      . Fluticasone-Salmeterol (ADVAIR DISKUS) 250-50 MCG/DOSE AEPB Inhale 2 puffs into the lungs daily.       Marland Kitchen lisinopril-hydrochlorothiazide (PRINZIDE,ZESTORETIC) 20-25 MG per tablet Take 1 tablet by mouth daily.       . metoprolol succinate (TOPROL-XL) 100 MG 24 hr tablet Take 50-100 mg by mouth 2 (two) times daily. Takes 1 tablets every morning and takes  tablet every night at bedtime.      . montelukast (SINGULAIR) 10 MG tablet Take 1 tablet (10 mg total) by mouth at bedtime.  30 tablet  0  . nitroGLYCERIN (NITROSTAT) 0.4 MG SL tablet Place 1 tablet (0.4 mg total) under the tongue every 5 (five) minutes  as needed. For chest pain  25 tablet  3  . simvastatin (ZOCOR) 40 MG tablet Take 40 mg by mouth daily.      . Vitamin D, Ergocalciferol, (DRISDOL) 50000 UNITS CAPS Take 50,000 Units by mouth every 7 (seven) days.       No current facility-administered medications for this visit.   REVIEW OF SYSTEMS: Valu.Nieves ] denotes positive finding; [  ] denotes negative finding  CARDIOVASCULAR:  Valu.Nieves ] chest pain   Valu.Nieves ] chest pressure   [ ]  palpitations   [ ]  orthopnea   Valu.Nieves ] dyspnea on exertion   [ ]  claudication   [ ]  rest pain   [ ]  DVT   [ ]  phlebitis PULMONARY:   [ ]  productive cough   Valu.Nieves ] asthma   Valu.Nieves ] wheezing NEUROLOGIC:   [ ]  weakness  [ ]  paresthesias  [ ]  aphasia  [ ]  amaurosis  [ ]  dizziness HEMATOLOGIC:   [ ]  bleeding problems   [ ]  clotting disorders MUSCULOSKELETAL:  [ ]  joint pain   [ ]  joint swelling Valu.Nieves ] leg swelling GASTROINTESTINAL: [ ]   blood in stool  [ ]    hematemesis GENITOURINARY:  [ ]   dysuria  [ ]   hematuria PSYCHIATRIC:  [ ]  history of major depression INTEGUMENTARY:  [ ]  rashes  [ ]  ulcers CONSTITUTIONAL:  [ ]  fever   [ ]  chills  PHYSICAL EXAM: Filed Vitals:   03/23/13 1401  BP: 126/84  Pulse: 62  Height: 5\' 6"  (1.676 m)  Weight: 179 lb (81.194 kg)  SpO2: 94%   Body mass index is 28.91 kg/(m^2). GENERAL: The patient is a well-nourished female, in no acute distress. The vital signs are documented above. CARDIOVASCULAR: There is a regular rate and rhythm. She has palpable femoral pulses and palpable pedal pulses. PULMONARY: There is good air exchange bilaterally without wheezing or rales. ABDOMEN: Soft and non-tender with normal pitched bowel sounds.  MUSCULOSKELETAL: There are no major deformities or cyanosis. NEUROLOGIC: No focal weakness or paresthesias are detected. SKIN: She has some truncal varicosities along the medial and anterior aspect of her left leg. PSYCHIATRIC: The patient has a normal affect.  DATA:  I have independently interpreted her venous duplex scan. On the left side she does have some reflux at the saphenofemoral junction. There is some deep vein reflux on the left. The small saphenous vein on the left has reflux.  On the right side she likewise has some saphenofemoral reflux. She's previously had a right greater saphenous vein stripped in the 1960s. She does have reflux in her small saphenous vein on the right. She has no evidence of DVT in the right.  MEDICAL ISSUES:  Varicose veins of lower extremities with other complications This patient does have varicose veins of the left lower extremity which are symptomatic.  We have discussed the importance of intermittent leg elevation and the proper positioning for this. I've also written her a prescription for compression stockings. Also discussed a water aerobics which I think also is helpful in patients with venous disease. I've encouraged her to avoid prolonged  sitting and standing. Unfortunately she does stand quite a bit at work. If her symptoms progressed she could potentially be considered for laser ablation of her left small saphenous vein and stab avulsion of her varicosities. I'll see her back as needed.   [place] [facility] [place] [facility]: [phone]

## 2013-03-28 ENCOUNTER — Telehealth: Payer: Self-pay | Admitting: Cardiology

## 2013-03-28 ENCOUNTER — Encounter (HOSPITAL_COMMUNITY): Payer: Self-pay | Admitting: Emergency Medicine

## 2013-03-28 ENCOUNTER — Emergency Department (HOSPITAL_COMMUNITY): Payer: Medicare Other

## 2013-03-28 ENCOUNTER — Inpatient Hospital Stay (HOSPITAL_COMMUNITY)
Admission: EM | Admit: 2013-03-28 | Discharge: 2013-03-30 | DRG: 191 | Disposition: A | Discharge status: Home / Self Care | Payer: Medicare Other | Attending: Internal Medicine | Admitting: Internal Medicine

## 2013-03-28 DIAGNOSIS — I10 Essential (primary) hypertension: Secondary | ICD-10-CM

## 2013-03-28 DIAGNOSIS — Z9861 Coronary angioplasty status: Secondary | ICD-10-CM

## 2013-03-28 DIAGNOSIS — Z79899 Other long term (current) drug therapy: Secondary | ICD-10-CM

## 2013-03-28 DIAGNOSIS — I2510 Atherosclerotic heart disease of native coronary artery without angina pectoris: Secondary | ICD-10-CM | POA: Diagnosis present

## 2013-03-28 DIAGNOSIS — I8390 Asymptomatic varicose veins of unspecified lower extremity: Secondary | ICD-10-CM | POA: Diagnosis present

## 2013-03-28 DIAGNOSIS — R1310 Dysphagia, unspecified: Secondary | ICD-10-CM

## 2013-03-28 DIAGNOSIS — E785 Hyperlipidemia, unspecified: Secondary | ICD-10-CM

## 2013-03-28 DIAGNOSIS — K219 Gastro-esophageal reflux disease without esophagitis: Secondary | ICD-10-CM

## 2013-03-28 DIAGNOSIS — J45909 Unspecified asthma, uncomplicated: Secondary | ICD-10-CM

## 2013-03-28 DIAGNOSIS — J45901 Unspecified asthma with (acute) exacerbation: Secondary | ICD-10-CM | POA: Diagnosis present

## 2013-03-28 DIAGNOSIS — J411 Mucopurulent chronic bronchitis: Principal | ICD-10-CM

## 2013-03-28 DIAGNOSIS — R079 Chest pain, unspecified: Secondary | ICD-10-CM

## 2013-03-28 DIAGNOSIS — J0410 Acute tracheitis without obstruction: Secondary | ICD-10-CM

## 2013-03-28 HISTORY — DX: Unspecified asthma, uncomplicated: J45.909

## 2013-03-28 LAB — BASIC METABOLIC PANEL
BUN: 14 mg/dL (ref 6–23)
CALCIUM: 9.9 mg/dL (ref 8.4–10.5)
CO2: 26 meq/L (ref 19–32)
Chloride: 105 mEq/L (ref 96–112)
Creatinine, Ser: 0.66 mg/dL (ref 0.50–1.10)
GFR calc Af Amer: >90 mL/min (ref >90)
GFR calc non Af Amer: 86 mL/min — ABNORMAL LOW (ref >90)
GLUCOSE: 106 mg/dL — AB (ref 70–99)
Potassium: 4.5 mEq/L (ref 3.7–5.3)
Sodium: 145 mEq/L (ref 137–147)

## 2013-03-28 LAB — CBC
HCT: 41.3 % (ref 36.0–46.0)
Hemoglobin: 13.9 g/dL (ref 12.0–15.0)
MCH: 30.3 pg (ref 26.0–34.0)
MCHC: 33.7 g/dL (ref 30.0–36.0)
MCV: 90.2 fL (ref 78.0–100.0)
PLATELETS: 190 10*3/uL (ref 150–400)
RBC: 4.58 MIL/uL (ref 3.87–5.11)
RDW: 12.7 % (ref 11.5–15.5)
WBC: 6.8 10*3/uL (ref 4.0–10.5)

## 2013-03-28 MED ORDER — ALBUTEROL SULFATE (2.5 MG/3ML) 0.083% IN NEBU: 2.5000 mg | INHALATION_SOLUTION | RESPIRATORY_TRACT | Status: DC | PRN

## 2013-03-28 MED ORDER — ONDANSETRON HCL 4 MG PO TABS: 4.0000 mg | ORAL_TABLET | Freq: Four times a day (QID) | ORAL | Status: DC | PRN

## 2013-03-28 MED ORDER — MONTELUKAST SODIUM 10 MG PO TABS
10.0000 mg | ORAL_TABLET | Freq: Every day | ORAL | Status: DC
  Administered 2013-03-28 – 2013-03-29 (×2): 10 mg via ORAL
  Filled 2013-03-28 (×3): qty 1

## 2013-03-28 MED ORDER — OSELTAMIVIR PHOSPHATE 75 MG PO CAPS
75.0000 mg | ORAL_CAPSULE | Freq: Two times a day (BID) | ORAL | Status: DC
  Administered 2013-03-28 – 2013-03-30 (×4): 75 mg via ORAL
  Filled 2013-03-28 (×5): qty 1

## 2013-03-28 MED ORDER — NITROGLYCERIN 0.4 MG SL SUBL: 0.4000 mg | SUBLINGUAL_TABLET | SUBLINGUAL | Status: DC | PRN

## 2013-03-28 MED ORDER — LISINOPRIL-HYDROCHLOROTHIAZIDE 20-25 MG PO TABS: 1.0000 | ORAL_TABLET | Freq: Every day | ORAL | Status: DC

## 2013-03-28 MED ORDER — ONDANSETRON HCL 4 MG/2ML IJ SOLN: 4.0000 mg | Freq: Four times a day (QID) | INTRAMUSCULAR | Status: DC | PRN

## 2013-03-28 MED ORDER — ASPIRIN 325 MG PO TABS
325.0000 mg | ORAL_TABLET | Freq: Every day | ORAL | Status: DC
  Administered 2013-03-29 – 2013-03-30 (×2): 325 mg via ORAL
  Filled 2013-03-28 (×2): qty 1

## 2013-03-28 MED ORDER — PANTOPRAZOLE SODIUM 40 MG PO TBEC
40.0000 mg | DELAYED_RELEASE_TABLET | Freq: Every day | ORAL | Status: DC
Start: 2013-03-29 — End: 2013-03-29
  Administered 2013-03-29: 40 mg via ORAL

## 2013-03-28 MED ORDER — SIMVASTATIN 40 MG PO TABS
40.0000 mg | ORAL_TABLET | ORAL | Status: DC
  Administered 2013-03-29 – 2013-03-30 (×2): 40 mg via ORAL
  Filled 2013-03-28 (×3): qty 1

## 2013-03-28 MED ORDER — ATORVASTATIN CALCIUM 40 MG PO TABS
40.0000 mg | ORAL_TABLET | Freq: Every day | ORAL | Status: DC
  Administered 2013-03-29 – 2013-03-30 (×2): 40 mg via ORAL
  Filled 2013-03-28 (×2): qty 1

## 2013-03-28 MED ORDER — AMLODIPINE BESYLATE 5 MG PO TABS
5.0000 mg | ORAL_TABLET | Freq: Every day | ORAL | Status: DC
  Administered 2013-03-29 – 2013-03-30 (×2): 5 mg via ORAL
  Filled 2013-03-28 (×2): qty 1

## 2013-03-28 MED ORDER — ALPRAZOLAM 0.25 MG PO TABS: 0.2500 mg | ORAL_TABLET | Freq: Two times a day (BID) | ORAL | Status: DC | PRN

## 2013-03-28 MED ORDER — LORATADINE 10 MG PO TABS
10.0000 mg | ORAL_TABLET | Freq: Every day | ORAL | Status: DC
  Administered 2013-03-29 – 2013-03-30 (×2): 10 mg via ORAL
  Filled 2013-03-28 (×2): qty 1

## 2013-03-28 MED ORDER — METHYLPREDNISOLONE SODIUM SUCC 40 MG IJ SOLR
40.0000 mg | Freq: Two times a day (BID) | INTRAMUSCULAR | Status: DC
  Administered 2013-03-28 – 2013-03-29 (×2): 40 mg via INTRAVENOUS
  Filled 2013-03-28 (×3): qty 1

## 2013-03-28 MED ORDER — HYDROCHLOROTHIAZIDE 25 MG PO TABS
25.0000 mg | ORAL_TABLET | Freq: Every day | ORAL | Status: DC
  Administered 2013-03-29 – 2013-03-30 (×2): 25 mg via ORAL
  Filled 2013-03-28 (×2): qty 1

## 2013-03-28 MED ORDER — MOMETASONE FURO-FORMOTEROL FUM 100-5 MCG/ACT IN AERO
2.0000 | INHALATION_SPRAY | Freq: Two times a day (BID) | RESPIRATORY_TRACT | Status: DC
  Administered 2013-03-28 – 2013-03-30 (×4): 2 via RESPIRATORY_TRACT
  Filled 2013-03-28 (×2): qty 8.8

## 2013-03-28 MED ORDER — LISINOPRIL 20 MG PO TABS
20.0000 mg | ORAL_TABLET | Freq: Every day | ORAL | Status: DC
  Administered 2013-03-29 – 2013-03-30 (×2): 20 mg via ORAL
  Filled 2013-03-28 (×2): qty 1

## 2013-03-28 MED ORDER — LEVOFLOXACIN IN D5W 500 MG/100ML IV SOLN
500.0000 mg | INTRAVENOUS | Status: DC
  Administered 2013-03-28: 500 mg via INTRAVENOUS
  Filled 2013-03-28 (×2): qty 100

## 2013-03-28 MED ORDER — METOPROLOL SUCCINATE ER 50 MG PO TB24
50.0000 mg | ORAL_TABLET | Freq: Two times a day (BID) | ORAL | Status: DC
  Administered 2013-03-28 – 2013-03-29 (×2): 50 mg via ORAL
  Administered 2013-03-29: 100 mg via ORAL
  Administered 2013-03-30: 50 mg via ORAL
  Filled 2013-03-28 (×3): qty 2
  Filled 2013-03-28: qty 1
  Filled 2013-03-28: qty 2

## 2013-03-28 MED ORDER — ENOXAPARIN SODIUM 40 MG/0.4ML ~~LOC~~ SOLN
40.0000 mg | SUBCUTANEOUS | Status: DC
  Administered 2013-03-29 – 2013-03-30 (×2): 40 mg via SUBCUTANEOUS
  Filled 2013-03-28 (×2): qty 0.4

## 2013-03-28 MED ORDER — IOHEXOL 350 MG/ML SOLN
80.0000 mL | Freq: Once | INTRAVENOUS | Status: AC | PRN
  Administered 2013-03-28: 67 mL via INTRAVENOUS

## 2013-03-28 NOTE — Telephone Encounter (Signed)
New Problem:  Pt's daughter states after the pt's appt on Wednesday of last week w/ [name].. The Pt developed flu symptoms. The Pt went to her pulmonary doctor.. Pt is now at [facility] to have a bronch procedure tomorrow. Daughter just wanted to make [name] aware. Daughter states her mom is also having some memory issues.

## 2013-03-28 NOTE — Telephone Encounter (Signed)
Will forward to  Dr. Brackbill for review 

## 2013-03-28 NOTE — H&P (Addendum)
[facility] History and Physical  DESA RECH IRW:[id number] DOB: 26-Sep-1940 DOA: 03/28/2013  Referring physician: ED physician PCP: Kerry Hough, MD   Chief Complaint:   HPI:  73 year old female with past medical history of hypertension, asthma, dyslipidemia who presented to [facility] ED 03/28/2013 for evaluation of ongoing and progressively worsening shortness of breaht, cough, wheezing, and congestion for over past few days prior to this admission. Pt reported her symptoms started about week and a half ago with flu like symptoms and she was started on Tamiflu but she has never gotten significantly better in terms of respiratory symptoms. No complaints of chest pain, palpitations. No abdominal pain, nausea or vomiting. No falls, no loss of consciousness. No reports of blood in stool or urine. She was seen by pulmonologist in [place], Dr. Gwendel Hanson and he recommended hospitalization and: For further evaluation and management of possible asthma exacerbation with tracheitis. He recommended treatment with Solu-Medrol and IV antibiotics and consultation with pulmonology team at [facility] for consideration of bronchoscopy.  In ED. BP was 151/86, HR 77, Tmax 98.3 F and oxygen saturation 96%. Blood work was essentially unremarkable. CT chest angio ruled out PE, there was a tiny pericardiac effusion. ABG done at Dr. Farris Has office --> pH 7.54, PCO2 24, PO2 103, HCO3 21.  Assessment and Plan:  Principal Problem: Acute respiratory distress - likely asthma exacerbation, possible flu, questionable tracheitis - pt started on steroids (steroid used is solumedrol 60 mg Q 12 hours), may use albuterol inhaler and Advair; will use Levaquin for tracheitis   - respiratory status stable at this time - call PCCM in am for bronchoscopy consideration - Respiratory virus panel ordered, sputum analysis ordered, urine Legionella and strep pneumo - oxygen support via nasal canula to keep O2 saturation above  90%  Active Problems:  Tracheitis - continue Levaquin and steroids IV   Hypertension - continue Norvasc 5 mg daily, metoprolol 100 mg daily and Prinizide 20-25 mg daily   Dyslipidemia - continue statin therapy   Code Status: Full Family Communication: Pt, husband, daughter at bedside Disposition Plan: Admit to medical bed    Review of Systems:  Constitutional: Negative for diaphoresis.  HENT: Negative for hearing loss, ear pain, nosebleeds, congestion, sore throat, neck pain, tinnitus and ear discharge.   Eyes: Negative for blurred vision, double vision, photophobia, pain, discharge and redness.  Respiratory: positive for shortness of breath and cough, no wheezing and stridor.   Cardiovascular: Negative for chest pain, palpitations, orthopnea, claudication and leg swelling.  Gastrointestinal: Negative for nausea, vomiting and abdominal pain. Negative for heartburn, constipation, blood in stool and melena.  Genitourinary: Negative for dysuria, urgency, frequency, hematuria and flank pain.  Musculoskeletal: Negative for myalgias, back pain, joint pain and falls.  Skin: Negative for itching and rash.  Neurological:  Negative for tingling, tremors, sensory change, speech change, focal weakness, loss of consciousness and headaches.  Endo/Heme/Allergies: Negative for environmental allergies and polydipsia. Does not bruise/bleed easily.  Psychiatric/Behavioral: Negative for suicidal ideas. The patient is not nervous/anxious.      Past Medical History  Diagnosis Date  . Coronary artery disease     s/p Rotablator to mid RCA in 2/12  . Weakness   . Fatigue   . GERD (gastroesophageal reflux disease)   . Dyslipidemia   . Hypertension   . Varicose vein   . PONV (postoperative nausea and vomiting)   . Asthma     Past Surgical History  Procedure Laterality Date  . Coronary  angioplasty  2/12    Rotablator to the RCA  . Abdominal hysterectomy  1996    total w/BSO  . Orif tibia &  fibula fractures  2000  . Hardware removal  02/21/2011    Procedure: HARDWARE REMOVAL;  Surgeon: Laurice Record Aplington;  Location: [place];  Service: Orthopedics;  Laterality: Left;  REMOVAL TWO SCREWS DISTAL LEFT TIBIA   . Leg surgery Left     rod, pins, screws   . Coronary stent placement      Social History:  reports that she has never smoked. She has never used smokeless tobacco. She reports that she does not drink alcohol or use illicit drugs.  Allergies  Allergen Reactions  . Morphine Anaphylaxis    Family History  Problem Relation Age of Onset  . Heart attack Mother   . Heart attack Father   . Heart attack Sister   . Heart attack Brother   . Heart attack Brother   . Heart attack Brother   . Cancer Daughter   . Heart disease Daughter   . Hyperlipidemia Daughter   . Varicose Veins Daughter   . Heart disease Son     Medication Sig  albuterol (PROVENTIL) (2.5 MG/3ML) 0.083% nebulizer solution Take 2.5 mg by nebulization every 6 (six) hours as needed. For shortness of breath  ALPRAZolam (XANAX) 0.25 MG tablet Take 0.25 mg by mouth daily.  amLODipine (NORVASC) 5 MG tablet Take 5 mg by mouth daily.   aspirin 325 MG tablet Take 325 mg by mouth daily.    atorvastatin (LIPITOR) 40 MG tablet Take 40 mg by mouth daily.  esomeprazole (NEXIUM) 40 MG capsule Take 40 mg by mouth daily before breakfast.    fexofenadine (ALLEGRA) 180 MG tablet Take 180 mg by mouth daily.  Fluticasone-Salmeterol (ADVAIR DISKUS) 250-50 MCG/DOSE AEPB Inhale 2 puffs into the lungs daily.   lisinopril-hydrochlorothiazide (PRINZIDE,ZESTORETIC) 20-25 MG per tablet Take 1 tablet by mouth daily.   metoprolol succinate (TOPROL-XL) 100 MG 24 hr tablet Take 50-100 mg by mouth 2 (two) times daily. Takes 1 tablets every morning and takes  tablet every night at bedtime.  montelukast (SINGULAIR) 10 MG tablet Take 1 tablet (10 mg total) by mouth at bedtime.  nitroGLYCERIN (NITROSTAT) 0.4 MG SL tablet  Place 1 tablet (0.4 mg total) under the tongue every 5 (five) minutes as needed. For chest pain  oseltamivir (TAMIFLU) 75 MG capsule Take 75 mg by mouth 2 (two) times daily.  simvastatin (ZOCOR) 40 MG tablet Take 40 mg by mouth every morning.   Vitamin D, Ergocalciferol, (DRISDOL) 50000 UNITS CAPS capsule Take 50,000 Units by mouth every 7 (seven) days. On fridays    Physical Exam: Filed Vitals:   03/28/13 1818 03/28/13 1830 03/28/13 1845 03/28/13 2038  BP:  151/86 165/89 167/95  Pulse:  86 77 78  Temp:      TempSrc:      Resp:    20  Weight:      SpO2: 96% 97% 97% 96%    Physical Exam  Constitutional: Appears well-developed and well-nourished. No distress.  HENT: Normocephalic. External right and left ear normal. Oropharynx is clear and moist.  Eyes: Conjunctivae and EOM are normal. PERRLA, no scleral icterus.  Neck: Normal ROM. Neck supple. No JVD. No tracheal deviation. No thyromegaly.  CVS: RRR, S1/S2 +, no murmurs, no gallops, no carotid bruit.  Pulmonary: Effort and breath sounds normal, no stridor, rhonchi, wheezes, rales.  Abdominal: Soft. BS +,  no  distension, tenderness, rebound or guarding.  Musculoskeletal: Normal range of motion. No edema and no tenderness.  Lymphadenopathy: No lymphadenopathy noted, cervical, inguinal. Neuro: Alert. Normal reflexes, muscle tone coordination. No cranial nerve deficit. Skin: Skin is warm and dry. No rash noted. Not diaphoretic. No erythema. No pallor.  Psychiatric: Normal mood and affect. Behavior, judgment, thought content normal.   Labs on Admission:  Basic Metabolic Panel:  Recent Labs Lab 03/28/13 1734  NA 145  K 4.5  CL 105  CO2 26  GLUCOSE 106*  BUN 14  CREATININE 0.66  CALCIUM 9.9   CBC:  Recent Labs Lab 03/28/13 1734  WBC 6.8  HGB 13.9  HCT 41.3  MCV 90.2  PLT 190   Radiological Exams on Admission: Ct Angio Chest Pe W/cm &/or Wo Cm   03/28/2013    1. There is no evidence of an acute pulmonary embolism. No  acute thoracic aortic pathology is demonstrated either.  2. There is no alveolar pneumonia. There are mild changes of emphysema. No pulmonary nodules or masses are demonstrated.  3. There is no mediastinal or hilar lymphadenopathy. There is a tiny pericardial effusion. There is no pleural effusion.     Faye Ramsay, MD  [facility] Pager [phone]  If 7PM-7AM, please contact night-coverage [email or web address] Password [facility] 03/28/2013, 9:51 PM

## 2013-03-28 NOTE — ED Notes (Addendum)
Presents from Dr. Newell Coral office with history of coughing, congestion, SOB and weakness for the last 4 days. She was sent here to be admitted for a bronchoscopy, Spoke with Dr. Doy Mince, [place]. Pt denies chest pain, dneis SOB at this time. Reports that she had a chest xray in the doctor office today.

## 2013-03-28 NOTE — ED Notes (Signed)
MD at bedside. 

## 2013-03-28 NOTE — ED Provider Notes (Signed)
CSN: [id number]     Arrival date & time 03/28/13  1720 History   First MD Initiated Contact with Patient 03/28/13 1754     Chief Complaint  Patient presents with  . Shortness of Breath   (Consider location/radiation/quality/duration/timing/severity/associated sxs/prior Treatment) Patient is a 73 y.o. female presenting with shortness of breath. The history is provided by the patient, the spouse and a relative.  Shortness of Breath  patient here with 3 days of shortness of breath with associated cough and trouble swallowing. Diagnosed with influenza and placed on Tamiflu by her physician in [place]. Seen by a pulmonologist today and diagnosed with tracheitis which she's had before. She has been sent here for evaluation by pulmonary medicine with possible bronchoscopy. Denies any current fever. No dyspnea noted. She is not taking antibiotics. Symptoms have been persistent.  Past Medical History  Diagnosis Date  . Coronary artery disease     s/p Rotablator to mid RCA in 2/12  . Weakness   . Fatigue   . GERD (gastroesophageal reflux disease)   . Dyslipidemia   . Hypertension   . Varicose vein   . PONV (postoperative nausea and vomiting)   . Asthma    Past Surgical History  Procedure Laterality Date  . Coronary angioplasty  2/12    Rotablator to the RCA  . Abdominal hysterectomy  1996    total w/BSO  . Orif tibia & fibula fractures  2000  . Hardware removal  02/21/2011    Procedure: HARDWARE REMOVAL;  Surgeon: Laurice Record Aplington;  Location: [place];  Service: Orthopedics;  Laterality: Left;  REMOVAL TWO SCREWS DISTAL LEFT TIBIA   . Leg surgery Left     rod, pins, screws   . Coronary stent placement     Family History  Problem Relation Age of Onset  . Heart attack Mother   . Heart attack Father   . Heart attack Sister   . Heart attack Brother   . Heart attack Brother   . Heart attack Brother   . Cancer Daughter   . Heart disease Daughter   .  Hyperlipidemia Daughter   . Varicose Veins Daughter   . Heart disease Son    History  Substance Use Topics  . Smoking status: Never Smoker   . Smokeless tobacco: Never Used  . Alcohol Use: No   OB History   Grav Para Term Preterm Abortions TAB SAB Ect Mult Living                 Review of Systems  Respiratory: Positive for shortness of breath.   All other systems reviewed and are negative.    Allergies  Morphine  Home Medications   Current Outpatient Rx  Name  Route  Sig  Dispense  Refill  . albuterol (PROVENTIL) (2.5 MG/3ML) 0.083% nebulizer solution   Nebulization   Take 2.5 mg by nebulization every 6 (six) hours as needed. For shortness of breath         . ALPRAZolam (XANAX) 0.25 MG tablet   Oral   Take 0.25 mg by mouth daily.         Marland Kitchen amLODipine (NORVASC) 5 MG tablet   Oral   Take 5 mg by mouth daily.          Marland Kitchen aspirin 325 MG tablet   Oral   Take 325 mg by mouth daily.           Marland Kitchen atorvastatin (LIPITOR) 40 MG tablet  Oral   Take 40 mg by mouth daily.         Marland Kitchen esomeprazole (NEXIUM) 40 MG capsule   Oral   Take 40 mg by mouth daily before breakfast.           . fexofenadine (ALLEGRA) 180 MG tablet   Oral   Take 180 mg by mouth daily.         . Fluticasone-Salmeterol (ADVAIR DISKUS) 250-50 MCG/DOSE AEPB   Inhalation   Inhale 2 puffs into the lungs daily.          Marland Kitchen lisinopril-hydrochlorothiazide (PRINZIDE,ZESTORETIC) 20-25 MG per tablet   Oral   Take 1 tablet by mouth daily.          . metoprolol succinate (TOPROL-XL) 100 MG 24 hr tablet   Oral   Take 50-100 mg by mouth 2 (two) times daily. Takes 1 tablets every morning and takes  tablet every night at bedtime.         . montelukast (SINGULAIR) 10 MG tablet   Oral   Take 1 tablet (10 mg total) by mouth at bedtime.   30 tablet   0     Patient needs to get further refills from PCP   . nitroGLYCERIN (NITROSTAT) 0.4 MG SL tablet   Sublingual   Place 1 tablet (0.4 mg  total) under the tongue every 5 (five) minutes as needed. For chest pain   25 tablet   3   . oseltamivir (TAMIFLU) 75 MG capsule   Oral   Take 75 mg by mouth 2 (two) times daily.         . simvastatin (ZOCOR) 40 MG tablet   Oral   Take 40 mg by mouth every morning.          . Vitamin D, Ergocalciferol, (DRISDOL) 50000 UNITS CAPS capsule   Oral   Take 50,000 Units by mouth every 7 (seven) days. On fridays          BP 167/95  Pulse 78  Temp(Src) 98.3 F (36.8 C) (Oral)  Resp 20  Wt 180 lb (81.647 kg)  SpO2 96% Physical Exam  Nursing note and vitals reviewed. Constitutional: She is oriented to person, place, and time. She appears well-developed and well-nourished.  Non-toxic appearance. No distress.  HENT:  Head: Normocephalic and atraumatic.  Eyes: Conjunctivae, EOM and lids are normal. Pupils are equal, round, and reactive to light.  Neck: Normal range of motion. Neck supple. No tracheal deviation present. No mass present.  Cardiovascular: Normal rate, regular rhythm and normal heart sounds.  Exam reveals no gallop.   No murmur heard. Pulmonary/Chest: Effort normal and breath sounds normal. No stridor. No respiratory distress. She has no decreased breath sounds. She has no wheezes. She has no rhonchi. She has no rales.  Abdominal: Soft. Normal appearance and bowel sounds are normal. She exhibits no distension. There is no tenderness. There is no rebound and no CVA tenderness.  Musculoskeletal: Normal range of motion. She exhibits no edema and no tenderness.  Neurological: She is alert and oriented to person, place, and time. She has normal strength. No cranial nerve deficit or sensory deficit. GCS eye subscore is 4. GCS verbal subscore is 5. GCS motor subscore is 6.  Skin: Skin is warm and dry. No abrasion and no rash noted.  Psychiatric: She has a normal mood and affect. Her speech is normal and behavior is normal.    ED Course  Procedures (including critical care  time) Labs  Review Labs Reviewed  BASIC METABOLIC PANEL - Abnormal; Notable for the following:    Glucose, Bld 106 (*)    GFR calc non Af Amer 86 (*)    All other components within normal limits  CBC   Imaging Review Ct Angio Chest Pe W/cm &/or Wo Cm  03/28/2013   CLINICAL DATA:  Four-day history of cough and congestion and dyspnea, denies chest pain. History of coronary angioplasty and asthma  EXAM: CT ANGIOGRAPHY CHEST WITH CONTRAST  TECHNIQUE: Multidetector CT imaging of the chest was performed using the standard protocol during bolus administration of intravenous contrast. Multiplanar CT image reconstructions including MIPs were obtained to evaluate the vascular anatomy.  CONTRAST:  100 cc OMNIPAQUE IOHEXOL 350 MG/ML SOLN  COMPARISON:  October 03, 2009 PA and lateral chest x-ray  FINDINGS: Contrast within the pulmonary arterial tree is normal in appearance. There are no filling defects to suggest an acute pulmonary embolism. The caliber of the thoracic aorta is normal. The cardiac chambers are mildly enlarged. There is a tiny pericardial effusion. There is no pleural effusion. No mediastinal or hilar lymphadenopathy is demonstrated. There is a dense calcification in the upper pole of the right thyroid lobe. The thoracic esophagus exhibits normal caliber.  At lung window settings there are emphysematous changes bilaterally. There is no alveolar pneumonia. Minimally increased subpleural density is noted adjacent to the major fissure on the left. No pulmonary parenchymal nodules or masses are demonstrated.  Within the upper abdomen the observed portions of the liver and spleen appear normal. The thoracic vertebral bodies are preserved in height. The retrosternal soft tissues appear normal. No acute abnormality of the visualized ribs is demonstrated.  Review of the MIP images confirms the above findings.  IMPRESSION: 1. There is no evidence of an acute pulmonary embolism. No acute thoracic aortic pathology  is demonstrated either. 2. There is no alveolar pneumonia. There are mild changes of emphysema. No pulmonary nodules or masses are demonstrated. 3. There is no mediastinal or hilar lymphadenopathy. There is a tiny pericardial effusion. There is no pleural effusion.   Electronically Signed   By: David  Martinique   On: 03/28/2013 20:11    EKG Interpretation    Date/Time:  Monday March 28 2013 17:37:07 EST Ventricular Rate:  95 PR Interval:  182 QRS Duration: 84 QT Interval:  364 QTC Calculation: 457 R Axis:   163 Text Interpretation:  Normal sinus rhythm Anterolateral infarct , age undetermined Abnormal ECG Confirmed by Rayvion Stumph  MD, Marlaine Arey ([id number]) on 03/28/2013 5:54:43 PM            MDM  No diagnosis found. Spoke with patient's pulmonologist, Dr. Farris Has, and he recommended the patient admitted for IV antibiotics and steroids. Patient had a CT of the chest to that was negative. Spoke with [facility] and patient to be admitted    Leota Jacobsen, MD 03/28/13 2134

## 2013-03-29 ENCOUNTER — Encounter (HOSPITAL_COMMUNITY): Payer: Self-pay | Admitting: *Deleted

## 2013-03-29 DIAGNOSIS — K219 Gastro-esophageal reflux disease without esophagitis: Secondary | ICD-10-CM

## 2013-03-29 DIAGNOSIS — J411 Mucopurulent chronic bronchitis: Principal | ICD-10-CM

## 2013-03-29 DIAGNOSIS — E785 Hyperlipidemia, unspecified: Secondary | ICD-10-CM

## 2013-03-29 DIAGNOSIS — I10 Essential (primary) hypertension: Secondary | ICD-10-CM

## 2013-03-29 DIAGNOSIS — J45909 Unspecified asthma, uncomplicated: Secondary | ICD-10-CM

## 2013-03-29 DIAGNOSIS — J0410 Acute tracheitis without obstruction: Secondary | ICD-10-CM

## 2013-03-29 LAB — CBC
HCT: 42.7 % (ref 36.0–46.0)
HEMOGLOBIN: 14.2 g/dL (ref 12.0–15.0)
MCH: 30 pg (ref 26.0–34.0)
MCHC: 33.3 g/dL (ref 30.0–36.0)
MCV: 90.3 fL (ref 78.0–100.0)
Platelets: 204 10*3/uL (ref 150–400)
RBC: 4.73 MIL/uL (ref 3.87–5.11)
RDW: 12.7 % (ref 11.5–15.5)
WBC: 4.8 10*3/uL (ref 4.0–10.5)

## 2013-03-29 LAB — BASIC METABOLIC PANEL
BUN: 13 mg/dL (ref 6–23)
CO2: 23 meq/L (ref 19–32)
Calcium: 9.5 mg/dL (ref 8.4–10.5)
Chloride: 105 mEq/L (ref 96–112)
Creatinine, Ser: 0.52 mg/dL (ref 0.50–1.10)
GFR calc Af Amer: >90 mL/min (ref >90)
GFR calc non Af Amer: >90 mL/min (ref >90)
GLUCOSE: 171 mg/dL — AB (ref 70–99)
POTASSIUM: 4.4 meq/L (ref 3.7–5.3)
SODIUM: 143 meq/L (ref 137–147)

## 2013-03-29 LAB — RESPIRATORY VIRUS PANEL
ADENOVIRUS: NOT DETECTED
Influenza A H1: NOT DETECTED
Influenza A H3: NOT DETECTED
Influenza A: NOT DETECTED
Influenza B: NOT DETECTED
Metapneumovirus: NOT DETECTED
PARAINFLUENZA 1 A: NOT DETECTED
PARAINFLUENZA 2 A: NOT DETECTED
PARAINFLUENZA 3 A: NOT DETECTED
RESPIRATORY SYNCYTIAL VIRUS B: NOT DETECTED
Respiratory Syncytial Virus A: NOT DETECTED
Rhinovirus: NOT DETECTED

## 2013-03-29 LAB — STREP PNEUMONIAE URINARY ANTIGEN: STREP PNEUMO URINARY ANTIGEN: NEGATIVE

## 2013-03-29 MED ORDER — PANTOPRAZOLE SODIUM 40 MG PO TBEC
40.0000 mg | DELAYED_RELEASE_TABLET | Freq: Two times a day (BID) | ORAL | Status: DC
  Administered 2013-03-29 – 2013-03-30 (×2): 40 mg via ORAL
  Filled 2013-03-29: qty 1

## 2013-03-29 MED ORDER — TRAMADOL HCL 50 MG PO TABS: 50.0000 mg | ORAL_TABLET | Freq: Four times a day (QID) | ORAL | Status: DC | PRN

## 2013-03-29 MED ORDER — LEVOFLOXACIN 500 MG PO TABS
500.0000 mg | ORAL_TABLET | Freq: Every day | ORAL | Status: DC
  Administered 2013-03-29: 500 mg via ORAL
  Filled 2013-03-29 (×2): qty 1

## 2013-03-29 MED ORDER — SALINE SPRAY 0.65 % NA SOLN
2.0000 | Freq: Four times a day (QID) | NASAL | Status: DC
  Administered 2013-03-29 – 2013-03-30 (×5): 2 via NASAL
  Filled 2013-03-29: qty 44

## 2013-03-29 MED ORDER — FAMOTIDINE 40 MG PO TABS
40.0000 mg | ORAL_TABLET | Freq: Every day | ORAL | Status: DC
  Administered 2013-03-29: 40 mg via ORAL
  Filled 2013-03-29 (×4): qty 1

## 2013-03-29 MED ORDER — FLUTICASONE PROPIONATE 50 MCG/ACT NA SUSP
2.0000 | Freq: Two times a day (BID) | NASAL | Status: DC
  Administered 2013-03-29 – 2013-03-30 (×3): 2 via NASAL
  Filled 2013-03-29: qty 16

## 2013-03-29 MED ORDER — DM-GUAIFENESIN ER 30-600 MG PO TB12
1.0000 | ORAL_TABLET | Freq: Two times a day (BID) | ORAL | Status: DC
  Administered 2013-03-29 – 2013-03-30 (×3): 1 via ORAL
  Filled 2013-03-29 (×4): qty 1

## 2013-03-29 MED ORDER — OXYMETAZOLINE HCL 0.05 % NA SOLN
2.0000 | Freq: Two times a day (BID) | NASAL | Status: DC
  Administered 2013-03-29 – 2013-03-30 (×3): 2 via NASAL
  Filled 2013-03-29: qty 15

## 2013-03-29 NOTE — Consult Note (Signed)
Name: Lindsey Middleton MRN: [id number] DOB: 1940-11-05    ADMISSION DATE:  03/28/2013 CONSULTATION DATE:  2/3  88 MD :  Ree Kida [facility]ICE:  [facility]  CHIEF COMPLAINT:  Dyspnea and cough-->asking about bronchoscopy in setting of possible tracheitis   BRIEF PATIENT DESCRIPTION:    73 year old female who was admitted on 2/2 with cc: ongoing and progressive SOB, cough, wheeze and congestion. Onset of symptoms started about week and half prior to presentation w/ flu-like symptoms, for which she was started on tamiflu. She was seen by Pulmonary medicine in [place] who felt she had tracheitis, she needed admission for further evaluation and care but did not have hospital privileges so was referred [facility].   SIGNIFICANT EVENTS / STUDIES:  CT chest 2/2: There is no evidence of an acute pulmonary embolism. No acute thoracic aortic pathology is demonstrated either.   LINES / TUBES:   CULTURES: Res viral panel 2/2>>> U strep 2/2: neg  U legionella 2/2>>>  ANTIBIOTICS: levaquin 2/2>>> tamiflu 2/2>>>  HISTORY OF PRESENT ILLNESS:    73 year old female who was admitted on 2/2 with cc: ongoing and progressive SOB, cough, wheeze and congestion. Onset of symptoms started about week and half prior to presentation w/ flu-like symptoms, for which she was started on tamiflu. She was seen by Pulmonary medicine in [place] who felt she had tracheitis, she needed admission for further evaluation and care but did not have hospital privileges so was referred to [facility].   PAST MEDICAL HISTORY :  Past Medical History  Diagnosis Date  . Coronary artery disease     s/p Rotablator to mid RCA in 2/12  . Weakness   . Fatigue   . GERD (gastroesophageal reflux disease)   . Dyslipidemia   . Hypertension   . Varicose vein   . PONV (postoperative nausea and vomiting)   . Asthma    Past Surgical History  Procedure Laterality Date  . Coronary angioplasty  2/12    Rotablator to the RCA  . Abdominal  hysterectomy  1996    total w/BSO  . Orif tibia & fibula fractures  2000  . Hardware removal  02/21/2011    Procedure: HARDWARE REMOVAL;  Surgeon: Laurice Record Aplington;  Location: [facility];  Service: Orthopedics;  Laterality: Left;  REMOVAL TWO SCREWS DISTAL LEFT TIBIA   . Leg surgery Left     rod, pins, screws   . Coronary stent placement     Prior to Admission medications   Medication Sig Start Date End Date Taking? Authorizing Provider  albuterol (PROVENTIL) (2.5 MG/3ML) 0.083% nebulizer solution Take 2.5 mg by nebulization every 6 (six) hours as needed. For shortness of breath   Yes Historical Provider, MD  ALPRAZolam (XANAX) 0.25 MG tablet Take 0.25 mg by mouth daily.   Yes Historical Provider, MD  amLODipine (NORVASC) 5 MG tablet Take 5 mg by mouth daily.    Yes Historical Provider, MD  aspirin 325 MG tablet Take 325 mg by mouth daily.     Yes Historical Provider, MD  atorvastatin (LIPITOR) 40 MG tablet Take 40 mg by mouth daily.   Yes Historical Provider, MD  esomeprazole (NEXIUM) 40 MG capsule Take 40 mg by mouth daily before breakfast.     Yes Historical Provider, MD  fexofenadine (ALLEGRA) 180 MG tablet Take 180 mg by mouth daily.   Yes Historical Provider, MD  Fluticasone-Salmeterol (ADVAIR DISKUS) 250-50 MCG/DOSE AEPB Inhale 2 puffs into the lungs daily.  Yes Historical Provider, MD  lisinopril-hydrochlorothiazide (PRINZIDE,ZESTORETIC) 20-25 MG per tablet Take 1 tablet by mouth daily.  02/05/12  Yes Historical Provider, MD  metoprolol succinate (TOPROL-XL) 100 MG 24 hr tablet Take 50-100 mg by mouth 2 (two) times daily. Takes 1 tablets every morning and takes  tablet every night at bedtime.   Yes Historical Provider, MD  montelukast (SINGULAIR) 10 MG tablet Take 1 tablet (10 mg total) by mouth at bedtime. 03/06/11  Yes Darlin Coco, MD  nitroGLYCERIN (NITROSTAT) 0.4 MG SL tablet Place 1 tablet (0.4 mg total) under the tongue every 5 (five) minutes as needed.  For chest pain 09/20/12  Yes Darlin Coco, MD  oseltamivir (TAMIFLU) 75 MG capsule Take 75 mg by mouth 2 (two) times daily.   Yes Historical Provider, MD  simvastatin (ZOCOR) 40 MG tablet Take 40 mg by mouth every morning.    Yes Historical Provider, MD  Vitamin D, Ergocalciferol, (DRISDOL) 50000 UNITS CAPS capsule Take 50,000 Units by mouth every 7 (seven) days. On fridays   Yes Historical Provider, MD   Allergies  Allergen Reactions  . Morphine Anaphylaxis    FAMILY HISTORY:  Family History  Problem Relation Age of Onset  . Heart attack Mother   . Heart attack Father   . Heart attack Sister   . Heart attack Brother   . Heart attack Brother   . Heart attack Brother   . Cancer Daughter   . Heart disease Daughter   . Hyperlipidemia Daughter   . Varicose Veins Daughter   . Heart disease Son    SOCIAL HISTORY:  reports that she has never smoked. She has never used smokeless tobacco. She reports that she does not drink alcohol or use illicit drugs.  Review of Systems:   Bolds are positive  Constitutional: weight loss, gain, night sweats, Fevers, chills, fatigue .  HEENT: headaches, Sore throat, sneezing, nasal congestion, post nasal drip, Difficulty swallowing, Tooth/dental problems, visual complaints visual changes, ear ache CV:  chest pain, radiates: ,Orthopnea, PND, swelling in lower extremities, dizziness, palpitations, syncope.  GI  heartburn, indigestion, abdominal pain, nausea, vomiting, diarrhea, change in bowel habits, loss of appetite, bloody stools.  Resp: cough, productive: , hemoptysis, dyspnea, chest pain, pleuritic.  Skin: rash or itching or icterus GU: dysuria, change in color of urine, urgency or frequency. flank pain, hematuria  MS: joint pain or swelling. decreased range of motion  Psych: change in mood or affect. depression or anxiety.  Neuro: difficulty with speech, weakness, numbness, ataxia    SUBJECTIVE:  No acute distress  VITAL SIGNS: Temp:  [97.6  F (36.4 C)-98.3 F (36.8 C)] 97.6 F (36.4 C) (02/03 0615) Pulse Rate:  [71-93] 83 (02/03 0615) Resp:  [20] 20 (02/03 0615) BP: (116-167)/(74-96) 116/74 mmHg (02/03 0615) SpO2:  [96 %-97 %] 96 % (02/03 0827) Weight:  [81.647 kg (180 lb)-82.3 kg (181 lb 7 oz)] 82.3 kg (181 lb 7 oz) (02/02 2319)  PHYSICAL EXAMINATION: General:  No acute distress.  Neuro:  Awake, alert, no focal def  HEENT:  No JVD, Some upper airway turbulence, but no sig pseudo-wheeze  Cardiovascular:  rrr Lungs:  Clear, no accessory muscle use  Abdomen:  Soft, non-tender + bowel sounds  Musculoskeletal:  Intact  Skin:  Intact    Recent Labs Lab 03/28/13 1734 03/29/13 0711  NA 145 143  K 4.5 4.4  CL 105 105  CO2 26 23  BUN 14 13  CREATININE 0.66 0.52  GLUCOSE 106* 171*  Recent Labs Lab 03/28/13 1734 03/29/13 0711  HGB 13.9 14.2  HCT 41.3 42.7  WBC 6.8 4.8  PLT 190 204   Ct Angio Chest Pe W/cm &/or Wo Cm  03/28/2013   CLINICAL DATA:  Four-day history of cough and congestion and dyspnea, denies chest pain. History of coronary angioplasty and asthma  EXAM: CT ANGIOGRAPHY CHEST WITH CONTRAST  TECHNIQUE: Multidetector CT imaging of the chest was performed using the standard protocol during bolus administration of intravenous contrast. Multiplanar CT image reconstructions including MIPs were obtained to evaluate the vascular anatomy.  CONTRAST:  100 cc OMNIPAQUE IOHEXOL 350 MG/ML SOLN  COMPARISON:  October 03, 2009 PA and lateral chest x-ray  FINDINGS: Contrast within the pulmonary arterial tree is normal in appearance. There are no filling defects to suggest an acute pulmonary embolism. The caliber of the thoracic aorta is normal. The cardiac chambers are mildly enlarged. There is a tiny pericardial effusion. There is no pleural effusion. No mediastinal or hilar lymphadenopathy is demonstrated. There is a dense calcification in the upper pole of the right thyroid lobe. The thoracic esophagus exhibits normal  caliber.  At lung window settings there are emphysematous changes bilaterally. There is no alveolar pneumonia. Minimally increased subpleural density is noted adjacent to the major fissure on the left. No pulmonary parenchymal nodules or masses are demonstrated.  Within the upper abdomen the observed portions of the liver and spleen appear normal. The thoracic vertebral bodies are preserved in height. The retrosternal soft tissues appear normal. No acute abnormality of the visualized ribs is demonstrated.  Review of the MIP images confirms the above findings.  IMPRESSION: 1. There is no evidence of an acute pulmonary embolism. No acute thoracic aortic pathology is demonstrated either. 2. There is no alveolar pneumonia. There are mild changes of emphysema. No pulmonary nodules or masses are demonstrated. 3. There is no mediastinal or hilar lymphadenopathy. There is a tiny pericardial effusion. There is no pleural effusion.   Electronically Signed   By: David  Martinique   On: 03/28/2013 20:11    ASSESSMENT / PLAN: Cyclic Cough. 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 Her exam is not very impressive. She has reported h/o reactive airway disease, but there is currently no evidence of bronchospasm. Would not offer bronchoscopy on the basis of "tracheitis". If her symptoms were worse, ie: loud upper airway psuedo wheeze, direct laryngoscopy would be a reasonable to differentiate upper vs lower airway noises, but given her primary issue is cough and her upper airway noises not bad would favor more conservative approach focusing on cough suppression.  Recommendations Stop steroids Change advair to [facility] (done) to avoid inhaled powders which can exacerbate upper airway disease Cough suppression w/ PRN antitussives Escalate reflux regimen Treat Post-nasal gtt Continue abx for 8 day total for purulent bronchitis. No role for bronchoscopy in this case. Please arrange for her to follow up with [facility] as outpatient.   Rush Farmer, M.D. [facility] Pulmonary/Critical Care Medicine. Pager: [phone]. After hours pager: [phone].

## 2013-03-29 NOTE — Progress Notes (Signed)
[facility]                                                                              Patient Demographics  Lindsey Middleton, is a 73 y.o. female, DOB - 01-16-1941, [id number]  Admit date - 03/28/2013   Admitting Physician Theodis Blaze, MD  Outpatient Primary MD for the patient is Kerry Hough, MD  LOS - 1   Chief Complaint  Patient presents with  . Shortness of Breath        Assessment & Plan   Acute respiratory distress  -likely asthma exacerbation, possible flu, questionable tracheitis  -Pending respiratory virus panel -PCCM consulted for possible bronchoscopy -Per PCCM recommendations: discontinue steroids, Change advair to [facility] to avoid upper airway exacerbation, cough suppression with antitussives, no need for bronchoscopy at this time -Strep pneumonia Urine Ag negative -CTAngio Chest: No evidence of acute pulmonary embolism, no alveolar pneumonia, no mediastinal or hilar lymphadenopathy -Will continue Levaquin and tamiflu empirically, along with nasal spray -Outpatient follow up with PCCM in 4 weeks  Possible Tracheitis  -continue Levaquin and treatment as above  Hypertension  -continue Norvasc 5 mg daily, metoprolol 100 mg daily and Prinizide 20-25 mg daily   Dyslipidemia  -continue statin therapy   GERD -Continue pepcid and protoinx  Code Status: Full  Family Communication: Husband at bedside  Disposition Plan: Admitted.  Will likely discharge in next 1-2 days, if patient improves.  Time Spent in minutes   30 minutes  Procedures none  Consults   PCCM  DVT Prophylaxis  Lovenox   Lab Results  Component Value Date   PLT 204 03/29/2013    Medications  Scheduled Meds: . amLODipine  5 mg Oral Daily  . aspirin  325 mg Oral Daily  . atorvastatin  40 mg Oral Daily  . enoxaparin (LOVENOX) injection  40 mg Subcutaneous Q24H  . hydrochlorothiazide  25 mg Oral Daily  . levofloxacin (LEVAQUIN) IV  500 mg Intravenous Q24H  . lisinopril   20 mg Oral Daily  . loratadine  10 mg Oral Daily  . methylPREDNISolone (SOLU-MEDROL) injection  40 mg Intravenous Q12H  . metoprolol succinate  50-100 mg Oral BID  . mometasone-formoterol  2 puff Inhalation BID  . montelukast  10 mg Oral QHS  . oseltamivir  75 mg Oral BID  . pantoprazole  40 mg Oral Daily  . simvastatin  40 mg Oral [id number]   Continuous Infusions:  PRN Meds:.albuterol, ALPRAZolam, nitroGLYCERIN, ondansetron (ZOFRAN) IV, ondansetron  Antibiotics    Anti-infectives   Start     Dose/Rate Route Frequency Ordered Stop   03/28/13 2359  oseltamivir (TAMIFLU) capsule 75 mg     75 mg Oral 2 times daily 03/28/13 2313 04/02/13 2159   03/28/13 2200  levofloxacin (LEVAQUIN) IVPB 500 mg     500 mg 100 mL/hr over 60 Minutes Intravenous Every 24 hours 03/28/13 2158          Subjective:   Lindsey Middleton seen and examined today.  Patient states she is feeling better as compared to previous days.  Objective:   Filed Vitals:   03/28/13 2207 03/28/13 2319 03/29/13 0615 03/29/13 0827  BP: 151/95 157/83 116/74  Pulse: 71 77 83   Temp:  98 F (36.7 C) 97.6 F (36.4 C)   TempSrc:  Oral Oral   Resp: 20 20 20    Height:  5\' 6"  (1.676 m)    Weight:  82.3 kg (181 lb 7 oz)    SpO2: 97% 96% 96% 96%    Wt Readings from Last 3 Encounters:  03/28/13 82.3 kg (181 lb 7 oz)  03/23/13 81.194 kg (179 lb)  03/23/13 82.101 kg (181 lb)     Intake/Output Summary (Last 24 hours) at 03/29/13 3295 Last data filed at 03/29/13 0500  Gross per 24 hour  Intake    250 ml  Output    300 ml  Net    -50 ml    Exam  General: Well developed, well nourished, NAD, appears stated age  HEENT: NCAT, PERRLA, EOMI, Anicteic Sclera, mucous membranes moist.  Neck: Supple, no JVD, no masses  Cardiovascular: S1 S2 auscultated, no rubs, murmurs or gallops. Regular rate and rhythm.  Respiratory: Clear to auscultation bilaterally with equal chest rise, coarse upper airway sounds  Abdomen: Soft,  nontender, nondistended, + bowel sounds  Extremities: warm dry without cyanosis clubbing or edema  Neuro: AAOx3, cranial nerves grossly intact. Strength 5/5 in patient's upper and lower extremities bilaterally  Skin: Without rashes exudates or nodules  Psych: Normal affect and demeanor with intact judgement and insight  Data Review   Micro Results No results found for this or any previous visit (from the past 240 hour(s)).  Radiology Reports Ct Angio Chest Pe W/cm &/or Wo Cm  03/28/2013   CLINICAL DATA:  Four-day history of cough and congestion and dyspnea, denies chest pain. History of coronary angioplasty and asthma  EXAM: CT ANGIOGRAPHY CHEST WITH CONTRAST  TECHNIQUE: Multidetector CT imaging of the chest was performed using the standard protocol during bolus administration of intravenous contrast. Multiplanar CT image reconstructions including MIPs were obtained to evaluate the vascular anatomy.  CONTRAST:  100 cc OMNIPAQUE IOHEXOL 350 MG/ML SOLN  COMPARISON:  October 03, 2009 PA and lateral chest x-ray  FINDINGS: Contrast within the pulmonary arterial tree is normal in appearance. There are no filling defects to suggest an acute pulmonary embolism. The caliber of the thoracic aorta is normal. The cardiac chambers are mildly enlarged. There is a tiny pericardial effusion. There is no pleural effusion. No mediastinal or hilar lymphadenopathy is demonstrated. There is a dense calcification in the upper pole of the right thyroid lobe. The thoracic esophagus exhibits normal caliber.  At lung window settings there are emphysematous changes bilaterally. There is no alveolar pneumonia. Minimally increased subpleural density is noted adjacent to the major fissure on the left. No pulmonary parenchymal nodules or masses are demonstrated.  Within the upper abdomen the observed portions of the liver and spleen appear normal. The thoracic vertebral bodies are preserved in height. The retrosternal soft tissues  appear normal. No acute abnormality of the visualized ribs is demonstrated.  Review of the MIP images confirms the above findings.  IMPRESSION: 1. There is no evidence of an acute pulmonary embolism. No acute thoracic aortic pathology is demonstrated either. 2. There is no alveolar pneumonia. There are mild changes of emphysema. No pulmonary nodules or masses are demonstrated. 3. There is no mediastinal or hilar lymphadenopathy. There is a tiny pericardial effusion. There is no pleural effusion.   Electronically Signed   By: David  Martinique   On: 03/28/2013 20:11    CBC  Recent Labs Lab 03/28/13 1734  03/29/13 0711  WBC 6.8 4.8  HGB 13.9 14.2  HCT 41.3 42.7  PLT 190 204  MCV 90.2 90.3  MCH 30.3 30.0  MCHC 33.7 33.3  RDW 12.7 12.7    Chemistries   Recent Labs Lab 03/28/13 1734 03/29/13 0711  NA 145 143  K 4.5 4.4  CL 105 105  CO2 26 23  GLUCOSE 106* 171*  BUN 14 13  CREATININE 0.66 0.52  CALCIUM 9.9 9.5   ------------------------------------------------------------------------------------------------------------------ estimated creatinine clearance is 68.7 ml/min (by C-G formula based on Cr of 0.52). ------------------------------------------------------------------------------------------------------------------ No results found for this basename: HGBA1C,  in the last 72 hours ------------------------------------------------------------------------------------------------------------------ No results found for this basename: CHOL, HDL, LDLCALC, TRIG, CHOLHDL, LDLDIRECT,  in the last 72 hours ------------------------------------------------------------------------------------------------------------------ No results found for this basename: TSH, T4TOTAL, FREET3, T3FREE, THYROIDAB,  in the last 72 hours ------------------------------------------------------------------------------------------------------------------ No results found for this basename: VITAMINB12, FOLATE,  FERRITIN, TIBC, IRON, RETICCTPCT,  in the last 72 hours  Coagulation profile No results found for this basename: INR, PROTIME,  in the last 168 hours  No results found for this basename: DDIMER,  in the last 72 hours  Cardiac Enzymes No results found for this basename: CK, CKMB, TROPONINI, MYOGLOBIN,  in the last 168 hours ------------------------------------------------------------------------------------------------------------------ No components found with this basename: POCBNP,     Daimon Kean D.O. on 03/29/2013 at 9:09 AM  Between 7am to 7pm - Pager - [phone]  After 7pm go to [email or web address] - password [email or web address]  And look for the night coverage person covering for me after hours  [facility] Office  [phone]

## 2013-03-30 LAB — LEGIONELLA ANTIGEN, URINE: LEGIONELLA ANTIGEN, URINE: NEGATIVE

## 2013-03-30 MED ORDER — OXYMETAZOLINE HCL 0.05 % NA SOLN: 2.0000 | Freq: Two times a day (BID) | NASAL | Status: DC

## 2013-03-30 MED ORDER — PANTOPRAZOLE SODIUM 40 MG PO TBEC: 40.0000 mg | DELAYED_RELEASE_TABLET | Freq: Two times a day (BID) | ORAL | Status: DC

## 2013-03-30 MED ORDER — LEVOFLOXACIN 500 MG PO TABS: 500.0000 mg | ORAL_TABLET | Freq: Every day | ORAL | Status: DC

## 2013-03-30 MED ORDER — MOMETASONE FURO-FORMOTEROL FUM 100-5 MCG/ACT IN AERO: 2.0000 | INHALATION_SPRAY | Freq: Two times a day (BID) | RESPIRATORY_TRACT | Status: DC

## 2013-03-30 MED ORDER — FAMOTIDINE 40 MG PO TABS: 40.0000 mg | ORAL_TABLET | Freq: Every day | ORAL | Status: DC

## 2013-03-30 MED ORDER — DM-GUAIFENESIN ER 30-600 MG PO TB12: 1.0000 | ORAL_TABLET | Freq: Two times a day (BID) | ORAL | Status: DC

## 2013-03-30 MED ORDER — FLUTICASONE PROPIONATE 50 MCG/ACT NA SUSP: 2.0000 | Freq: Two times a day (BID) | NASAL | Status: DC

## 2013-03-30 NOTE — Progress Notes (Signed)
Pt given discharge instructions and new prescriptions. She verbalized understanding of all instructions, follow-up appts, and med schedule.  No questions or concerns at this time.  Taken down for discharge home with husband via wheelchair.

## 2013-04-05 ENCOUNTER — Encounter: Payer: Self-pay | Admitting: Internal Medicine

## 2013-04-05 ENCOUNTER — Other Ambulatory Visit (INDEPENDENT_AMBULATORY_CARE_PROVIDER_SITE_OTHER): Payer: Medicare Other

## 2013-04-05 ENCOUNTER — Ambulatory Visit (INDEPENDENT_AMBULATORY_CARE_PROVIDER_SITE_OTHER): Payer: Medicare Other | Admitting: Internal Medicine

## 2013-04-05 VITALS — BP 132/82 | HR 63 | Temp 98.2°F | Ht 66.0 in | Wt 183.0 lb

## 2013-04-05 DIAGNOSIS — R05 Cough: Secondary | ICD-10-CM

## 2013-04-05 DIAGNOSIS — J45909 Unspecified asthma, uncomplicated: Secondary | ICD-10-CM

## 2013-04-05 DIAGNOSIS — I10 Essential (primary) hypertension: Secondary | ICD-10-CM

## 2013-04-05 DIAGNOSIS — R058 Other specified cough: Secondary | ICD-10-CM

## 2013-04-05 DIAGNOSIS — R059 Cough, unspecified: Secondary | ICD-10-CM

## 2013-04-05 LAB — BASIC METABOLIC PANEL
BUN: 16 mg/dL (ref 6–23)
CHLORIDE: 109 meq/L (ref 96–112)
CO2: 26 mEq/L (ref 19–32)
Calcium: 9.3 mg/dL (ref 8.4–10.5)
Creatinine, Ser: 0.6 mg/dL (ref 0.4–1.2)
GFR: 98.56 mL/min (ref >60.00)
Glucose, Bld: 116 mg/dL — ABNORMAL HIGH (ref 70–99)
Potassium: 4.6 mEq/L (ref 3.5–5.1)
Sodium: 141 mEq/L (ref 135–145)

## 2013-04-05 LAB — LIPID PANEL
Cholesterol: 286 mg/dL — ABNORMAL HIGH (ref 0–200)
HDL: 52.5 mg/dL (ref >39.00)
Total CHOL/HDL Ratio: 5
Triglycerides: 144 mg/dL (ref 0.0–149.0)
VLDL: 28.8 mg/dL (ref 0.0–40.0)

## 2013-04-05 LAB — HEPATIC FUNCTION PANEL
ALBUMIN: 4.2 g/dL (ref 3.5–5.2)
ALK PHOS: 48 U/L (ref 39–117)
ALT: 21 U/L (ref 0–35)
AST: 17 U/L (ref 0–37)
Bilirubin, Direct: 0 mg/dL (ref 0.0–0.3)
Total Bilirubin: 0.5 mg/dL (ref 0.3–1.2)
Total Protein: 7.1 g/dL (ref 6.0–8.3)

## 2013-04-05 LAB — LDL CHOLESTEROL, DIRECT: Direct LDL: 215.3 mg/dL

## 2013-04-05 MED ORDER — VALSARTAN-HYDROCHLOROTHIAZIDE 160-25 MG PO TABS: 1.0000 | ORAL_TABLET | Freq: Every day | ORAL | Status: DC

## 2013-04-05 NOTE — Progress Notes (Signed)
Subjective:    Patient ID: Lindsey Middleton, female    DOB: 1940-04-24   MRN: [id number]  HPI  61 yowf never smoker with chronic resp problems 2010 attributed to refractory asthma while chronically  on blood pressure med ? acei admitted   ADMISSION DATE: 03/28/2013  CONSULTATION DATE: 2/3  REFERRING MD : Ree Kida  PRIMARY SERVICE: [facility]  CHIEF COMPLAINT: Dyspnea and cough-->asking about bronchoscopy in setting of possible tracheitis  BRIEF PATIENT DESCRIPTION:  73 year old female who was admitted on 2/2 with cc: ongoing and progressive SOB, cough, wheeze and congestion. Onset of symptoms started about week and half prior to presentation w/ flu-like symptoms, for which she was started on tamiflu. She was seen by Pulmonary medicine in [place] who felt she had tracheitis, she needed admission for further evaluation and care but did not have hospital privileges so was referred to [facility].  SIGNIFICANT EVENTS / STUDIES:  CT chest 2/2: There is no evidence of an acute pulmonary embolism. No acute thoracic aortic pathology is demonstrated either.    ANTIBIOTICS:  levaquin 2/2>>>  tamiflu 2/2>>>   Prior to Admission medications   Medication  Sig  Start Date  End Date  Taking?  Authorizing Provider   albuterol (PROVENTIL) (2.5 MG/3ML) 0.083% nebulizer solution  Take 2.5 mg by nebulization every 6 (six) hours as needed. For shortness of breath    Yes  Historical Provider, MD   ALPRAZolam (XANAX) 0.25 MG tablet  Take 0.25 mg by mouth daily.    Yes  Historical Provider, MD   amLODipine (NORVASC) 5 MG tablet  Take 5 mg by mouth daily.    Yes  Historical Provider, MD   aspirin 325 MG tablet  Take 325 mg by mouth daily.    Yes  Historical Provider, MD   atorvastatin (LIPITOR) 40 MG tablet  Take 40 mg by mouth daily.    Yes  Historical Provider, MD   esomeprazole (NEXIUM) 40 MG capsule  Take 40 mg by mouth daily before breakfast.    Yes  Historical Provider, MD   fexofenadine (ALLEGRA) 180 MG tablet  Take 180  mg by mouth daily.    Yes  Historical Provider, MD   Fluticasone-Salmeterol (ADVAIR DISKUS) 250-50 MCG/DOSE AEPB  Inhale 2 puffs into the lungs daily.    Yes  Historical Provider, MD   lisinopril-hydrochlorothiazide (PRINZIDE,ZESTORETIC) 20-25 MG per tablet  Take 1 tablet by mouth daily.  02/05/12   Yes  Historical Provider, MD   metoprolol succinate (TOPROL-XL) 100 MG 24 hr tablet  Take 50-100 mg by mouth 2 (two) times daily. Takes 1 tablets every morning and takes  tablet every night at bedtime.    Yes  Historical Provider, MD   montelukast (SINGULAIR) 10 MG tablet  Take 1 tablet (10 mg total) by mouth at bedtime.  03/06/11   Yes  Darlin Coco, MD   nitroGLYCERIN (NITROSTAT) 0.4 MG SL tablet  Place 1 tablet (0.4 mg total) under the tongue every 5 (five) minutes as needed. For chest pain  09/20/12   Yes  Darlin Coco, MD   oseltamivir (TAMIFLU) 75 MG capsule  Take 75 mg by mouth 2 (two) times daily.    Yes  Historical Provider, MD   simvastatin (ZOCOR) 40 MG tablet  Take 40 mg by mouth every morning.    Yes  Historical Provider, MD   Vitamin D, Ergocalciferol, (DRISDOL) 50000 UNITS CAPS capsule  Take 50,000 Units by mouth every 7 (seven) days. On  fridays    Yes  Historical Provider, MD   ASSESSMENT / PLAN:  Cyclic Cough. 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  Her exam is not very impressive. She has reported h/o reactive airway disease, but there is currently no evidence of bronchospasm. Would not offer bronchoscopy on the basis of "tracheitis". If her symptoms were worse, ie: loud upper airway psuedo wheeze, direct laryngoscopy would be a reasonable to differentiate upper vs lower airway noises, but given her primary issue is cough and her upper airway noises not bad would favor more conservative approach focusing on cough suppression.  Recommendations  Stop steroids  Change advair to [facility] (done) to avoid inhaled powders which can exacerbate upper airway disease  Cough  suppression w/ PRN antitussives  Escalate reflux regimen  Treat Post-nasal gtt  Continue abx for 8 day total for purulent bronchitis.  No role for bronchoscopy in this case.  Please arrange for her to follow up with [facility] as outpatient.    04/05/2013 1st [facility] eval/ pos hosp f/u / comprehensive transition of care/  [facility] cc better breathing but now back to chronic sensation of pnds, choking x years esp at hs.     No obvious day to day or daytime variabilty or assoc excess mucus or cp or chest tightness, subjective wheeze overt sinus or hb symptoms. No unusual exp hx or h/o childhood pna/ asthma or knowledge of premature birth.  Sleeping ok without nocturnal  or early am exacerbation  of respiratory  c/o's or need for noct saba. Also denies any obvious fluctuation of symptoms with weather or environmental changes or other aggravating or alleviating factors except as outlined above   Current Medications, Allergies, Complete Past Medical History, Past Surgical History, Family History, and Social History were reviewed in [employer] record.   .       Review of Systems  Constitutional: Negative for fever and unexpected weight change.  HENT: Positive for postnasal drip. Negative for congestion, dental problem, ear pain, nosebleeds, rhinorrhea, sinus pressure, sneezing, sore throat and trouble swallowing.   Eyes: Negative for redness and itching.  Respiratory: Positive for choking. Negative for cough, chest tightness, shortness of breath and wheezing.   Cardiovascular: Negative for palpitations and leg swelling.  Gastrointestinal: Negative for nausea and vomiting.  Genitourinary: Negative for dysuria.  Musculoskeletal: Negative for joint swelling.  Skin: Negative for rash.  Neurological: Negative for headaches.  Hematological: Does not bruise/bleed easily.  Psychiatric/Behavioral: Negative for dysphoric mood. The patient is not nervous/anxious.         Objective:   Physical Exam  amb wf nad  Wt Readings from Last 3 Encounters:  04/05/13 183 lb (83.008 kg)  03/28/13 181 lb 7 oz (82.3 kg)  03/23/13 179 lb (81.194 kg)     HEENT: nl dentition, turbinates, and orophanx. Nl external ear canals without cough reflex   NECK :  without JVD/Nodes/TM/ nl carotid upstrokes bilaterally   LUNGS: no acc muscle use, clear to A and P bilaterally without cough on insp or exp maneuvers   CV:  RRR  no s3 or murmur or increase in P2, no edema   ABD:  soft and nontender with nl excursion in the supine position. No bruits or organomegaly, bowel sounds nl  MS:  warm without deformities, calf tenderness, cyanosis or clubbing  SKIN: warm and dry without lesions    NEURO:  alert, approp, no deficits          Assessment &  Plan:

## 2013-04-05 NOTE — Assessment & Plan Note (Signed)
For now ok to continue dulera instead of advair, which should be avoided in UACS so as not to irritate the upper airway  In the longrun though it may be able to wean her off asthma rx as she had late onset while ? On acei so not clear to me how much real asthma there is here.

## 2013-04-05 NOTE — Patient Instructions (Addendum)
diovan 160/25 one daily and stop your lisinopril and I will let Dr Mare Ferrari know why  See Tammy NP w/in 2 weeks with all your medications, even over the counter meds, separated in two separate bags, the ones you take no matter what vs the ones you stop once you feel better and take only as needed when you feel you need them.   Tammy  will generate for you a new user friendly medication calendar that will put [place] all on the same page re: your medication use.     Without this process, it simply isn't possible to assure that we are providing  your outpatient care  with  the attention to detail we feel you deserve.   If we cannot assure that you're getting that kind of care,  then we cannot manage your problem effectively from this clinic.  Once you have seen Tammy and we are sure that we're all on the same page with your medication use she will arrange follow up with me.

## 2013-04-05 NOTE — Assessment & Plan Note (Signed)
ACE inhibitors are problematic in  pts with airway complaints because  even experienced pulmonologists can't always distinguish ace effects from copd/asthma.  By themselves they don't actually cause a problem, much like oxygen can't by itself start a fire, but they certainly serve as a powerful catalyst or enhancer for any "fire"  or inflammatory process in the upper airway, be it caused by an ET  tube or more commonly reflux (especially in the obese or pts with known GERD or who are on biphoshonates).    In the era of ARB near equivalency until we have a better handle on the reversibility of the airway problem, it just makes sense to avoid ACEI  entirely in the short run and then decide later, having established a level of airway control using a reasonable limited regimen, whether to add back ace but even then being very careful to observe the pt for worsening airway control and number of meds used/ needed to control symptoms.    Will try on diovan 160/25 one daily

## 2013-04-05 NOTE — Assessment & Plan Note (Addendum)
Classic Upper airway cough syndrome, so named because it's frequently impossible to sort out how much is  CR/sinusitis with freq throat clearing (which can be related to primary GERD)   vs  causing  secondary (" extra esophageal")  GERD from wide swings in gastric pressure that occur with throat clearing, often  promoting self use of mint and menthol lozenges that reduce the lower esophageal sphincter tone and exacerbate the problem further in a cyclical fashion.   These are the same pts (now being labeled as having "irritable larynx syndrome" by some cough centers) who not infrequently have a history of having failed to tolerate ace inhibitors ( as is likely the case here) ,  dry powder inhalers (as is likely the case here) or biphosphonates or report having atypical reflux symptoms (as is likely the case here )  that don't respond to standard doses of PPI , and are easily confused as having aecopd or asthma flares by even experienced allergists/ pulmonologists.  For now try off acei and advair and rx gerd then return in 2 weeks for med rec   To keep things simple, I have asked the patient to first separate medicines that are perceived as maintenance, that is to be taken daily "no matter what", from those medicines that are taken on only on an as-needed basis and I have given the patient examples of both, and then return to see our NP to generate a  detailed  medication calendar which should be followed until the next physician sees the patient and updates it.

## 2013-04-07 NOTE — Discharge Summary (Addendum)
Physician Discharge Summary  Lindsey Middleton SLH:[id number] DOB: March 24, 1940 DOA: 03/28/2013  PCP: Kerry Hough, MD  Admit date: 03/28/2013 Discharge date: 03/30/2013  Time spent: 30 minutes  Recommendations for Outpatient Follow-up:  1. Follow up with PCP in one week.   Discharge Diagnoses:  Active Problems:   Tracheitis   Purulent bronchitis   Discharge Condition: improved.   Diet recommendation: regular.   Filed Weights   03/28/13 1725 03/28/13 2319  Weight: 81.647 kg (180 lb) 82.3 kg (181 lb 7 oz)    History of present illness:  73 year old female with past medical history of hypertension, asthma, dyslipidemia who presented to [facility] ED 03/28/2013 for evaluation of ongoing and progressively worsening shortness of breaht, cough, wheezing, and congestion for over past few days prior to this admission. Pt reported her symptoms started about week and a half ago with flu like symptoms and she was started on Tamiflu but she has never gotten significantly better in terms of respiratory symptoms. No complaints of chest pain, palpitations. No abdominal pain, nausea or vomiting. No falls, no loss of consciousness. No reports of blood in stool or urine. She was seen by pulmonologist in [place], Dr. Gwendel Hanson and he recommended hospitalization and: For further evaluation and management of possible asthma exacerbation with tracheitis. He recommended treatment with Solu-Medrol and IV antibiotics and consultation with pulmonology team at [facility] for consideration of bronchoscopy.   Hospital Course:   Acute respiratory failure: resolved.   Likely secondary to  asthma exacerbation, possible flu, questionable tracheitis, respiratory virus panel is negative.  -PCCM consulted for possible bronchoscopy  -Per PCCM recommendations: discontinue steroids, Change advair to [facility] to avoid upper airway exacerbation, cough suppression with antitussives, no need for bronchoscopy at this time  -Strep pneumonia  Urine Ag negative  -CTAngio Chest: No evidence of acute pulmonary embolism, no alveolar pneumonia, no mediastinal or hilar lymphadenopathy  -Outpatient follow up with PCCM in 4 weeks  Possible Tracheitis  -continue Levaquin to complete the course.   Hypertension  -continue Norvasc 5 mg daily, metoprolol 100 mg daily and Prinizide 20-25 mg daily  Dyslipidemia  -continue statin therapy  GERD  -Continue pepcid and protoinx  Procedures:  none  Consultations:  pulmonary.  Discharge Exam: Filed Vitals:   03/30/13 1418  BP: 111/62  Pulse: 68  Temp: 97.5 F (36.4 C)  Resp: 20    General: alert afebrile comfort;able Cardiovascular: s1s2 Respiratory: ctab  Discharge Instructions  Discharge Orders   Future Appointments Provider Department Dept Phone   05/30/2013 2:00 PM Melvenia Needles, NP [facility] [phone]   Future Orders Complete By Expires   Discharge instructions  As directed    Comments:     Follow up with PCP in one week Follow up  With pulmonology as recommended.       Medication List    STOP taking these medications       ADVAIR DISKUS 250-50 MCG/DOSE Aepb  Generic drug:  Fluticasone-Salmeterol  Replaced by:  mometasone-formoterol 100-5 MCG/ACT Aero     esomeprazole 40 MG capsule  Commonly known as:  [place] by:  pantoprazole 40 MG tablet     oseltamivir 75 MG capsule  Commonly known as:  TAMIFLU      TAKE these medications       albuterol (2.5 MG/3ML) 0.083% nebulizer solution  Commonly known as:  PROVENTIL  Take 2.5 mg by nebulization every 6 (six) hours as needed. For shortness of breath  ALPRAZolam 0.25 MG tablet  Commonly known as:  XANAX  Take 0.25 mg by mouth daily.     amLODipine 5 MG tablet  Commonly known as:  NORVASC  Take 5 mg by mouth daily.     aspirin 325 MG tablet  Take 325 mg by mouth daily.     atorvastatin 40 MG tablet  Commonly known as:  LIPITOR  Take 40 mg by mouth daily.      dextromethorphan-guaiFENesin 30-600 MG per 12 hr tablet  Commonly known as:  MUCINEX DM  Take 1 tablet by mouth 2 (two) times daily.     famotidine 40 MG tablet  Commonly known as:  PEPCID  Take 1 tablet (40 mg total) by mouth at bedtime.     fexofenadine 180 MG tablet  Commonly known as:  ALLEGRA  Take 180 mg by mouth daily.     fluticasone 50 MCG/ACT nasal spray  Commonly known as:  FLONASE  Place 2 sprays into both nostrils 2 (two) times daily.     levofloxacin 500 MG tablet  Commonly known as:  LEVAQUIN  Take 1 tablet (500 mg total) by mouth daily at 6 PM.     metoprolol succinate 100 MG 24 hr tablet  Commonly known as:  TOPROL-XL  Take 50-100 mg by mouth 2 (two) times daily. Takes 1 tablets every morning and takes  tablet every night at bedtime.     mometasone-formoterol 100-5 MCG/ACT Aero  Commonly known as:  DULERA  Inhale 2 puffs into the lungs 2 (two) times daily.     montelukast 10 MG tablet  Commonly known as:  SINGULAIR  Take 1 tablet (10 mg total) by mouth at bedtime.     nitroGLYCERIN 0.4 MG SL tablet  Commonly known as:  NITROSTAT  Place 1 tablet (0.4 mg total) under the tongue every 5 (five) minutes as needed. For chest pain     oxymetazoline 0.05 % nasal spray  Commonly known as:  AFRIN  Place 2 sprays into both nostrils 2 (two) times daily.     pantoprazole 40 MG tablet  Commonly known as:  PROTONIX  Take 1 tablet (40 mg total) by mouth 2 (two) times daily before a meal.     simvastatin 40 MG tablet  Commonly known as:  ZOCOR  Take 40 mg by mouth every morning.     Vitamin D (Ergocalciferol) 50000 UNITS Caps capsule  Commonly known as:  DRISDOL  Take 50,000 Units by mouth every 7 (seven) days. On fridays       Allergies  Allergen Reactions  . Morphine Anaphylaxis       Follow-up Information   Follow up with [facility], MD. Schedule an appointment as soon as possible for a visit in 1 week.   Specialty:  Gastroenterology    Contact information:   [address] [place] [address] [phone]       Follow up with [facility]. Schedule an appointment as soon as possible for a visit in 1 week.       The results of significant diagnostics from this hospitalization (including imaging, microbiology, ancillary and laboratory) are listed below for reference.    Significant Diagnostic Studies: Ct Angio Chest Pe W/cm &/or Wo Cm  03/28/2013   CLINICAL DATA:  Four-day history of cough and congestion and dyspnea, denies chest pain. History of coronary angioplasty and asthma  EXAM: CT ANGIOGRAPHY CHEST WITH CONTRAST  TECHNIQUE: Multidetector CT imaging of the chest was performed using  the standard protocol during bolus administration of intravenous contrast. Multiplanar CT image reconstructions including MIPs were obtained to evaluate the vascular anatomy.  CONTRAST:  100 cc OMNIPAQUE IOHEXOL 350 MG/ML SOLN  COMPARISON:  October 03, 2009 PA and lateral chest x-ray  FINDINGS: Contrast within the pulmonary arterial tree is normal in appearance. There are no filling defects to suggest an acute pulmonary embolism. The caliber of the thoracic aorta is normal. The cardiac chambers are mildly enlarged. There is a tiny pericardial effusion. There is no pleural effusion. No mediastinal or hilar lymphadenopathy is demonstrated. There is a dense calcification in the upper pole of the right thyroid lobe. The thoracic esophagus exhibits normal caliber.  At lung window settings there are emphysematous changes bilaterally. There is no alveolar pneumonia. Minimally increased subpleural density is noted adjacent to the major fissure on the left. No pulmonary parenchymal nodules or masses are demonstrated.  Within the upper abdomen the observed portions of the liver and spleen appear normal. The thoracic vertebral bodies are preserved in height. The retrosternal soft tissues appear normal. No acute abnormality of the visualized ribs is  demonstrated.  Review of the MIP images confirms the above findings.  IMPRESSION: 1. There is no evidence of an acute pulmonary embolism. No acute thoracic aortic pathology is demonstrated either. 2. There is no alveolar pneumonia. There are mild changes of emphysema. No pulmonary nodules or masses are demonstrated. 3. There is no mediastinal or hilar lymphadenopathy. There is a tiny pericardial effusion. There is no pleural effusion.   Electronically Signed   By: David  Martinique   On: 03/28/2013 20:11    Microbiology: Recent Results (from the past 240 hour(s))  RESPIRATORY VIRUS PANEL     Status: None   Collection Time    03/28/13 11:28 PM      Result Value Ref Range Status   Source - RVPAN NASAL SWAB   Corrected   Comment: CORRECTED ON 02/03 AT 1801: PREVIOUSLY REPORTED AS NASAL SWAB   Respiratory Syncytial Virus A NOT DETECTED   Final   Respiratory Syncytial Virus B NOT DETECTED   Final   Influenza A NOT DETECTED   Final   Influenza B NOT DETECTED   Final   Parainfluenza 1 NOT DETECTED   Final   Parainfluenza 2 NOT DETECTED   Final   Parainfluenza 3 NOT DETECTED   Final   Metapneumovirus NOT DETECTED   Final   Rhinovirus NOT DETECTED   Final   Adenovirus NOT DETECTED   Final   Influenza A H1 NOT DETECTED   Final   Influenza A H3 NOT DETECTED   Final   Comment: (NOTE)           Normal Reference Range for each Analyte: NOT DETECTED     Testing performed using the Luminex xTAG Respiratory Viral Panel test     kit.     This test was developed and its performance characteristics determined     by [employer]. It has not been cleared or approved by the [place]     Food and Drug Administration. This test is used for clinical purposes.     It should not be regarded as investigational or for research. This     laboratory is certified under the [place] (CLIA) as qualified to perform high complexity     clinical laboratory testing.      Performed at [facility]  Labs: Basic Metabolic Panel:  Recent Labs Lab 04/05/13 1242  NA 141  K 4.6  CL 109  CO2 26  GLUCOSE 116*  BUN 16  CREATININE 0.6  CALCIUM 9.3   Liver Function Tests:  Recent Labs Lab 04/05/13 1242  AST 17  ALT 21  ALKPHOS 48  BILITOT 0.5  PROT 7.1  ALBUMIN 4.2   No results found for this basename: LIPASE, AMYLASE,  in the last 168 hours No results found for this basename: AMMONIA,  in the last 168 hours CBC: No results found for this basename: WBC, NEUTROABS, HGB, HCT, MCV, PLT,  in the last 168 hours Cardiac Enzymes: No results found for this basename: CKTOTAL, CKMB, CKMBINDEX, TROPONINI,  in the last 168 hours BNP: BNP (last 3 results) No results found for this basename: PROBNP,  in the last 8760 hours CBG: No results found for this basename: GLUCAP,  in the last 168 hours     Signed:  Marlyss Cissell  [facility] 03/30/2013, 12:14 PM

## 2013-04-14 ENCOUNTER — Telehealth: Payer: Self-pay | Admitting: Cardiology

## 2013-04-14 ENCOUNTER — Other Ambulatory Visit: Payer: Medicare Other

## 2013-04-14 DIAGNOSIS — E7800 Pure hypercholesterolemia, unspecified: Secondary | ICD-10-CM

## 2013-04-14 MED ORDER — ATORVASTATIN CALCIUM 80 MG PO TABS: 80.0000 mg | ORAL_TABLET | Freq: Every day | ORAL | Status: DC

## 2013-04-14 NOTE — Telephone Encounter (Signed)
Left message to call back  

## 2013-04-14 NOTE — Telephone Encounter (Signed)
Advised patient of lab results and medication change  

## 2013-04-14 NOTE — Telephone Encounter (Signed)
New message ° ° ° ° °Want test results °

## 2013-04-14 NOTE — Telephone Encounter (Signed)
Patient is returning your call. Please call back she will put her phone in her pocket.

## 2013-04-14 NOTE — Telephone Encounter (Signed)
Message copied by Earvin Hansen on Thu Apr 14, 2013  3:09 PM ------      Message from: Darlin Coco      Created: Wed Apr 06, 2013  1:16 PM       Her cholesterol and LDL are very high.  Has she been taking her Lipitor 40 mg daily faithfully every day.?  I want her to increase Lipitor to 80 mg daily and watch diet carefully.  Recheck fasting lipids in 2-3 months ------

## 2013-04-19 ENCOUNTER — Encounter: Payer: Medicare Other | Admitting: Adult Health

## 2013-04-28 ENCOUNTER — Encounter: Payer: Self-pay | Admitting: Vascular Surgery

## 2013-05-11 ENCOUNTER — Encounter: Payer: Self-pay | Admitting: Adult Health

## 2013-05-19 ENCOUNTER — Telehealth: Payer: Self-pay | Admitting: Internal Medicine

## 2013-05-19 NOTE — Telephone Encounter (Signed)
Spoke with the pt  She states that Ruthe Mannan will cost her 220$ even with insurance coverage  She still has some advair and wants to know if she can take this  Has med cal with TP in May  Please advise, thanks!

## 2013-05-20 NOTE — Telephone Encounter (Signed)
That's fine for now

## 2013-05-20 NOTE — Telephone Encounter (Signed)
Called, spoke with pt. Informed her MW ok with her talking advair for now instead of dulera.  Advised to bring all meds with her to OV with [facility] in May for med cal.  She verbalized understanding and voiced no further questions or concerns at this time.  Note:  Pt states she is using advair 250 1 puff bid.  This was added to med list.

## 2013-05-30 ENCOUNTER — Encounter: Payer: Medicare Other | Admitting: Adult Health

## 2013-05-30 ENCOUNTER — Other Ambulatory Visit: Payer: Medicare Other

## 2013-06-29 ENCOUNTER — Encounter: Payer: Self-pay | Admitting: Cardiology

## 2013-06-30 ENCOUNTER — Telehealth: Payer: Self-pay | Admitting: Cardiology

## 2013-06-30 NOTE — Telephone Encounter (Signed)
Patient's daughter is calling concerned. Her mother fell and she went to her doctor yesterday b/c she hurt her neck. Daughter said that she told her doctor that her chest hurt when she fell, which prompted them to do a EKG. Patient claims to her daughter's that the doctor told her that she had a heart attack. Patient also claimed that they sent EKG to [place] and she needed to be ASAP. Daughters are just looking for clarification. Please call and advise.

## 2013-06-30 NOTE — Telephone Encounter (Signed)
Received information from PCP and scheduled ov for patient tomorrow

## 2013-06-30 NOTE — Telephone Encounter (Signed)
Follow up ° ° ° °Returned Melinda's call °

## 2013-07-01 ENCOUNTER — Ambulatory Visit (INDEPENDENT_AMBULATORY_CARE_PROVIDER_SITE_OTHER): Payer: Medicare Other | Admitting: Cardiology

## 2013-07-01 ENCOUNTER — Encounter: Payer: Medicare Other | Admitting: Adult Health

## 2013-07-01 ENCOUNTER — Encounter: Payer: Self-pay | Admitting: Cardiology

## 2013-07-01 VITALS — BP 116/74 | HR 96 | Ht 64.0 in | Wt 182.0 lb

## 2013-07-01 DIAGNOSIS — R079 Chest pain, unspecified: Secondary | ICD-10-CM

## 2013-07-01 DIAGNOSIS — E7800 Pure hypercholesterolemia, unspecified: Secondary | ICD-10-CM

## 2013-07-01 DIAGNOSIS — K219 Gastro-esophageal reflux disease without esophagitis: Secondary | ICD-10-CM

## 2013-07-01 DIAGNOSIS — Z01810 Encounter for preprocedural cardiovascular examination: Secondary | ICD-10-CM

## 2013-07-01 DIAGNOSIS — I2510 Atherosclerotic heart disease of native coronary artery without angina pectoris: Secondary | ICD-10-CM

## 2013-07-01 NOTE — Assessment & Plan Note (Signed)
She has not been having any recent exacerbation of her GERD symptoms

## 2013-07-01 NOTE — Assessment & Plan Note (Signed)
The patient is having intermittent chest tightness relieved by sublingual nitroglycerin.  She has known ischemic heart disease.  She anticipates that she may have to have neurosurgery on her neck because of a pinched nerve.  We will have her return for a lexi scan Myoview stress test.

## 2013-07-01 NOTE — Patient Instructions (Signed)
Your physician recommends that you continue on your current medications as directed. Please refer to the Current Medication list given to you today.  Your physician recommends that you schedule a follow-up appointment in: 4 month ov/ekg  Your physician has requested that you have a lexiscan myoview. For further information please visit [email or web address]. Please follow instruction sheet, as given.

## 2013-07-01 NOTE — Assessment & Plan Note (Signed)
Blood pressure was remaining stable on current therapy.  No symptoms of CHF

## 2013-07-01 NOTE — Progress Notes (Signed)
Lindsey Middleton Date of Birth:  [birth year] [place] [address] [place], [place]  [address] [phone]        Fax   [phone]   History of Present Illness: This pleasant 73 year old woman is seen for a scheduled 6 month followup office visit. She has a past history of known ischemic heart disease.She has multiple issues which include known ischemic heart disease, remote MI in 1986 with prior rotablator to the mid RCA in February of 2012, followed by placement of DES to the mid RCA. Her last cardiac catheterization was by Dr. Peter Martinique on 02/12/12 at which time he found nonobstructive coronary disease and her previous stent was widely patent. She had normal LV function. She had a normal abdominal aorta and no evidence of renal artery stenosis. Her other issues include GERD, HLD, asthma, and varicose veins with past DVT. She has hypercholesterolemia and is on Lipitor uncertain dose monitored by her PCP.  Since we last saw her she was hospitalized from 03/28/13 until 2//15 with acute tracheitis and bronchitis.  During that hospital stay she had a CT angiogram of her chest which was negative for pulmonary emboli. She has been having problems with neck pain.  She has seen an orthopedist and a neurosurgeon. She has been having some recurrent chest discomfort relieved by taking sublingual nitroglycerin. She is still under a lot of stress with her husband who has dementia.  The patient retired from work in January 2015 so she could help look after her husband.  Current Outpatient Prescriptions  Medication Sig Dispense Refill  . albuterol (PROVENTIL) (2.5 MG/3ML) 0.083% nebulizer solution Take 2.5 mg by nebulization every 6 (six) hours as needed. For shortness of breath      . ALPRAZolam (XANAX) 0.25 MG tablet Take 0.25 mg by mouth daily.      Marland Kitchen amLODipine (NORVASC) 5 MG tablet Take 5 mg by mouth daily.       Marland Kitchen aspirin 325 MG tablet Take 325 mg by mouth daily.        Marland Kitchen  atorvastatin (LIPITOR) 80 MG tablet Take 1 tablet (80 mg total) by mouth daily.  30 tablet  5  . esomeprazole (NEXIUM) 40 MG capsule Take 40 mg by mouth daily at 12 noon.      . famotidine (PEPCID) 40 MG tablet Take 1 tablet (40 mg total) by mouth at bedtime.  30 tablet  1  . fexofenadine (ALLEGRA) 180 MG tablet Take 180 mg by mouth daily.      . fluticasone (FLONASE) 50 MCG/ACT nasal spray Place 2 sprays into both nostrils 2 (two) times daily.  16 g  2  . Fluticasone-Salmeterol (ADVAIR) 250-50 MCG/DOSE AEPB Inhale 1 puff into the lungs 2 (two) times daily.      . metoprolol succinate (TOPROL-XL) 100 MG 24 hr tablet Take 50-100 mg by mouth 2 (two) times daily. Takes 1 tablets every morning and takes  tablet every night at bedtime.      . mometasone-formoterol (DULERA) 100-5 MCG/ACT AERO Not taking --- using advair right now d/t dulera cost      . montelukast (SINGULAIR) 10 MG tablet Take 1 tablet (10 mg total) by mouth at bedtime.  30 tablet  0  . nitroGLYCERIN (NITROSTAT) 0.4 MG SL tablet Place 1 tablet (0.4 mg total) under the tongue every 5 (five) minutes as needed. For chest pain  25 tablet  3  . oxymetazoline (AFRIN) 0.05 % nasal spray Place  2 sprays into both nostrils 2 (two) times daily.  30 mL  0  . simvastatin (ZOCOR) 40 MG tablet Take 40 mg by mouth every morning.       . valsartan-hydrochlorothiazide (DIOVAN HCT) 160-25 MG per tablet Take 1 tablet by mouth daily.  30 tablet  11  . Vitamin D, Ergocalciferol, (DRISDOL) 50000 UNITS CAPS capsule Take 50,000 Units by mouth every 7 (seven) days. On fridays       No current facility-administered medications for this visit.    Allergies  Allergen Reactions  . Morphine Anaphylaxis    Patient Active Problem List   Diagnosis Date Noted  . Upper airway cough syndrome 04/05/2013  . Purulent bronchitis 03/29/2013  . Tracheitis 03/28/2013  . Varicose veins of lower extremities with other complications 35/00/9381  . Chest pain 09/20/2012  .  Syncope 02/09/2012  . Edema 12/02/2010  . Coronary artery disease   . Chest pain   . Weakness   . Fatigue   . GERD (gastroesophageal reflux disease)   . Dyslipidemia   . Hypertension   . Malaise   . Ischemic heart disease   . HYPERLIPIDEMIA 03/25/2010  . HYPERTENSION 03/25/2010  . ASTHMA 03/25/2010  . CHEST PAIN UNSPECIFIED 03/25/2010  . NONSPECIFIC ABNORMAL UNSPEC CV FUNCTION STUDY 03/25/2010    History  Smoking status  . Never Smoker   Smokeless tobacco  . Never Used    History  Alcohol Use No    Family History  Problem Relation Age of Onset  . Heart attack Mother   . Heart disease Mother     Heart Disease before age 63  . Heart attack Father   . Heart disease Father     Heart Disease before age 31  . Heart attack Sister   . Cancer Sister   . Heart attack Brother   . Heart disease Brother     Heart Disease before age 71  . Heart attack Brother   . Heart attack Brother   . Cancer Daughter   . Heart disease Daughter   . Hyperlipidemia Daughter   . Varicose Veins Daughter   . Heart disease Son     Review of Systems: Constitutional: no fever chills diaphoresis or fatigue or change in weight.  Head and neck: no hearing loss, no epistaxis, no photophobia or visual disturbance. Respiratory: No cough, shortness of breath or wheezing. Cardiovascular: No chest pain peripheral edema, palpitations. Gastrointestinal: No abdominal distention, no abdominal pain, no change in bowel habits hematochezia or melena. Genitourinary: No dysuria, no frequency, no urgency, no nocturia. Musculoskeletal:No arthralgias, no back pain, no gait disturbance or myalgias. Neurological: No dizziness, no headaches, no numbness, no seizures, no syncope, no weakness, no tremors. Hematologic: No lymphadenopathy, no easy bruising. Psychiatric: No confusion, no hallucinations, no sleep disturbance.    Physical Exam: Filed Vitals:   07/01/13 1138  BP: 116/74  Pulse: 96   the general  appearance reveals a well-developed well-nourished woman in no distress.The head and neck exam reveals pupils equal and reactive.  Extraocular movements are full.  There is no scleral icterus.  The mouth and pharynx are normal.  The neck is supple.  The carotids reveal no bruits.  The jugular venous pressure is normal.  The  thyroid is not enlarged.  There is no lymphadenopathy.  The chest is clear to percussion and auscultation.  There are no rales or rhonchi.  Expansion of the chest is symmetrical.  The precordium is quiet.  The first heart  sound is normal.  The second heart sound is physiologically split.  There is no murmur gallop rub or click.  There is no abnormal lift or heave.  The abdomen is soft and nontender.  The bowel sounds are normal.  The liver and spleen are not enlarged.  There are no abdominal masses.  There are no abdominal bruits.  Extremities reveal good pedal pulses.  There is no phlebitis or edema.  There is veins are present.  There is no cyanosis or clubbing.  Strength is normal and symmetrical in all extremities.  There is no lateralizing weakness.  There are no sensory deficits.  The skin is warm and dry.  There is no rash.  EKG today shows normal sinus rhythm and nonspecific ST and T-wave changes unchanged from outside EKG of 06/29/13   Assessment / Plan: 1. chest pain 2. ischemic heart disease status post DES to the mid-RCA in February 2012 3. hypercholesterolemia followed by her PCP 4. varicose veins followed by [facility]. 5. preoperative evaluation--- pinched nerve in neck, may require neurosurgery.  Plan: Patient will return soon for a lexi scan Myoview stress test Return for followup office visit 4 months

## 2013-07-19 ENCOUNTER — Ambulatory Visit (HOSPITAL_COMMUNITY): Payer: Medicare Other | Attending: Cardiology | Admitting: Radiology

## 2013-07-19 VITALS — BP 181/100 | HR 75 | Ht 66.0 in | Wt 184.0 lb

## 2013-07-19 DIAGNOSIS — R079 Chest pain, unspecified: Secondary | ICD-10-CM | POA: Insufficient documentation

## 2013-07-19 DIAGNOSIS — R0989 Other specified symptoms and signs involving the circulatory and respiratory systems: Secondary | ICD-10-CM | POA: Insufficient documentation

## 2013-07-19 DIAGNOSIS — R0609 Other forms of dyspnea: Secondary | ICD-10-CM | POA: Insufficient documentation

## 2013-07-19 DIAGNOSIS — I2510 Atherosclerotic heart disease of native coronary artery without angina pectoris: Secondary | ICD-10-CM

## 2013-07-19 MED ORDER — TECHNETIUM TC 99M SESTAMIBI GENERIC - CARDIOLITE
30.0000 | Freq: Once | INTRAVENOUS | Status: AC | PRN
  Administered 2013-07-19: 30 via INTRAVENOUS

## 2013-07-19 MED ORDER — TECHNETIUM TC 99M SESTAMIBI GENERIC - CARDIOLITE
10.0000 | Freq: Once | INTRAVENOUS | Status: AC | PRN
  Administered 2013-07-19: 10 via INTRAVENOUS

## 2013-07-19 MED ORDER — REGADENOSON 0.4 MG/5ML IV SOLN
0.4000 mg | Freq: Once | INTRAVENOUS | Status: AC
  Administered 2013-07-19: 0.4 mg via INTRAVENOUS

## 2013-07-19 NOTE — Progress Notes (Signed)
[facility] SITE 3 NUCLEAR MED [place], [place] [address] [phone]    Cardiology Nuclear Med Study  Lindsey Middleton is a 73 y.o. female     MRN : [id number]     DOB: [birth year]  Procedure Date: 07/19/2013  Nuclear Med Background Indication for Stress Test:  Evaluation for Ischemia and Surgical Clearance:Neck surgery possibility  History:  CAD, MI, Cath, Stent (RCA), PTCA (RCA), Echo 2013 EF 60%, MPI 2013 (normal) EF 68%, asthma, hx. DVT Cardiac Risk Factors: Family History - CAD, Hypertension, Lipids and PVD  Symptoms:  Chest Pain (last date of chest discomfort was Monday) and DOE   Nuclear Pre-Procedure Caffeine/Decaff Intake:  None NPO After: 7:00pm   Lungs:  clear O2 Sat: 92% on room air. IV 0.9% NS with Angio Cath:  22g  IV Site: R Wrist  IV Started by:  Matilde Haymaker, RN  Chest Size (in):  34 Cup Size: B  Height: 5\' 6"  (1.676 m)  Weight:  184 lb (83.462 kg)  BMI:  Body mass index is 29.71 kg/(m^2). Tech Comments:  Toprol taken at 0630 today    Nuclear Med Study 1 or 2 day study: 1 day  Stress Test Type:  Treadmill/Lexiscan  Reading MD: n/a  Order Authorizing Provider:  Henrene Dodge  Resting Radionuclide: Technetium 61m Sestamibi  Resting Radionuclide Dose: 11.0 mCi   Stress Radionuclide:  Technetium 52m Sestamibi  Stress Radionuclide Dose: 33.0 mCi           Stress Protocol Rest HR: 75 Stress HR: 116  Rest BP: 181/100 Stress BP: 166/118  Exercise Time (min): n/a METS: n/a           Dose of Adenosine (mg):  n/a Dose of Lexiscan: 0.4 mg  Dose of Atropine (mg): n/a Dose of Dobutamine: n/a mcg/kg/min (at max HR)  Stress Test Technologist: Glade Lloyd, BS-ES  Nuclear Technologist:  Charlton Amor, CNMT     Rest Procedure:  Myocardial perfusion imaging was performed at rest 45 minutes following the intravenous administration of Technetium 41m Sestamibi. Rest ECG: NSR - Normal EKG  Stress Procedure:  The patient received  IV Lexiscan 0.4 mg over 15-seconds with concurrent low level exercise and then Technetium 6m Sestamibi was injected at 30-seconds while the patient continued walking one more minute.  Quantitative spect images were obtained after a 45-minute delay.  During the infusion of Lexiscan the patient complained of feeling fatigued and lightheaded.  These symptoms resolved in recovery.  Stress ECG: No significant ST segment change suggestive of ischemia.  QPS Raw Data Images:  Normal; no motion artifact; normal heart/lung ratio. Stress Images:  Normal homogeneous uptake in all areas of the myocardium. Rest Images:  Normal homogeneous uptake in all areas of the myocardium. Subtraction (SDS):  No evidence of ischemia. Transient Ischemic Dilatation (Normal <1.22):  1.03 Lung/Heart Ratio (Normal <0.45):  0.36  Quantitative Gated Spect Images QGS EDV:  89 ml QGS ESV:  33 ml  Impression Exercise Capacity:  Lexiscan with low level exercise. BP Response:  Normal blood pressure response. Clinical Symptoms:  No significant symptoms noted. ECG Impression:  No significant ST segment change suggestive of ischemia. Comparison with Prior Nuclear Study: No significant change from previous study from 06/16/11.    Overall Impression:  Normal stress nuclear study.  No evidence of ischemia.  Normal LV function.   LV Ejection Fraction: 63%.  LV Wall Motion:  NL LV Function; NL Wall Motion.   Thayer Headings,  Brooke Bonito., MD, [facility] 07/19/2013, 5:00 PM [address]  [place] Pager [phone]

## 2013-07-20 ENCOUNTER — Encounter: Payer: Self-pay | Admitting: Cardiology

## 2013-07-21 ENCOUNTER — Telehealth: Payer: Self-pay | Admitting: Cardiology

## 2013-07-21 ENCOUNTER — Other Ambulatory Visit: Payer: Self-pay

## 2013-07-21 DIAGNOSIS — E7800 Pure hypercholesterolemia, unspecified: Secondary | ICD-10-CM

## 2013-07-21 MED ORDER — ATORVASTATIN CALCIUM 80 MG PO TABS: 80.0000 mg | ORAL_TABLET | Freq: Every day | ORAL | Status: DC

## 2013-07-21 NOTE — Telephone Encounter (Signed)
Message copied by Earvin Hansen on Thu Jul 21, 2013  9:31 AM ------      Message from: Darlin Coco      Created: Wed Jul 20, 2013  2:38 PM       Please report.  The stress test was normal.  No ischemia.  The ejection fraction was normal.  Continue current medication. ------

## 2013-07-21 NOTE — Telephone Encounter (Signed)
Advised patient

## 2013-07-21 NOTE — Telephone Encounter (Signed)
New message    Calling for test results  

## 2013-08-16 ENCOUNTER — Telehealth: Payer: Self-pay | Admitting: *Deleted

## 2013-08-16 MED ORDER — ALPRAZOLAM 0.25 MG PO TABS: 0.2500 mg | ORAL_TABLET | Freq: Every day | ORAL | Status: DC

## 2013-08-16 NOTE — Telephone Encounter (Signed)
Patient requests xanax refill be called into [facility]. Thanks, MI

## 2013-11-03 ENCOUNTER — Ambulatory Visit: Payer: Medicare Other | Admitting: Cardiology

## 2013-12-19 ENCOUNTER — Telehealth: Payer: Self-pay | Admitting: Cardiology

## 2013-12-19 NOTE — Telephone Encounter (Signed)
New Message  Pt called.Reports that while she was away she refilled her Xantex and she is now unable to refill it. Please help

## 2013-12-20 ENCOUNTER — Telehealth: Payer: Self-pay

## 2013-12-20 ENCOUNTER — Ambulatory Visit: Payer: Medicare Other | Admitting: Cardiology

## 2013-12-20 NOTE — Telephone Encounter (Signed)
Ok to fill Xanax per  [name].  Left message to call back, unsure if patient want sent to mail order or local pharmacy

## 2013-12-21 NOTE — Telephone Encounter (Signed)
She wants it called to [facility] I will do this when the pharmacy opens

## 2013-12-22 ENCOUNTER — Telehealth: Payer: Self-pay

## 2013-12-22 NOTE — Telephone Encounter (Signed)
Tried to call Rx into [facility] I spoked to 2 people and the last one  sent me to the voice mail and I left a voice mail for her xanax

## 2013-12-22 NOTE — Telephone Encounter (Signed)
Error

## 2013-12-29 ENCOUNTER — Other Ambulatory Visit: Payer: Self-pay

## 2014-02-02 ENCOUNTER — Encounter (HOSPITAL_COMMUNITY): Payer: Self-pay | Admitting: Cardiology

## 2014-03-01 ENCOUNTER — Ambulatory Visit: Payer: Medicare Other | Admitting: Cardiology

## 2014-03-22 ENCOUNTER — Ambulatory Visit: Payer: Medicare Other | Admitting: Cardiology

## 2014-07-12 ENCOUNTER — Telehealth: Payer: Self-pay | Admitting: Internal Medicine

## 2014-07-12 NOTE — Telephone Encounter (Signed)
Received records from [facility] forwarded to Dr. Henrene Pastor 07/12/14

## 2014-07-18 ENCOUNTER — Telehealth: Payer: Self-pay | Admitting: Internal Medicine

## 2014-07-18 NOTE — Telephone Encounter (Signed)
Received records from [facility] forwarded to Dr. Scarlette Shorts 07/18/14 fbg.

## 2014-08-07 ENCOUNTER — Other Ambulatory Visit: Payer: Self-pay | Admitting: Cardiology

## 2014-08-07 DIAGNOSIS — F419 Anxiety disorder, unspecified: Secondary | ICD-10-CM

## 2014-08-14 ENCOUNTER — Other Ambulatory Visit: Payer: Self-pay | Admitting: *Deleted

## 2014-08-14 DIAGNOSIS — F419 Anxiety disorder, unspecified: Secondary | ICD-10-CM

## 2014-08-14 NOTE — Telephone Encounter (Signed)
Wants xanax refill.. Thanks

## 2014-08-14 NOTE — Telephone Encounter (Signed)
Rx for Xanax marked to print last week and it didn't print Called in today as requested

## 2014-08-15 ENCOUNTER — Other Ambulatory Visit: Payer: Self-pay | Admitting: Cardiology

## 2014-08-15 NOTE — Telephone Encounter (Signed)
Okay to refill xanax. 

## 2014-09-06 ENCOUNTER — Encounter: Payer: Self-pay | Admitting: Internal Medicine

## 2014-09-06 ENCOUNTER — Ambulatory Visit (INDEPENDENT_AMBULATORY_CARE_PROVIDER_SITE_OTHER): Payer: Medicare Other | Admitting: Internal Medicine

## 2014-09-06 VITALS — BP 126/84 | HR 80 | Ht 65.5 in | Wt 178.2 lb

## 2014-09-06 DIAGNOSIS — Z9889 Other specified postprocedural states: Secondary | ICD-10-CM | POA: Diagnosis not present

## 2014-09-06 DIAGNOSIS — R1310 Dysphagia, unspecified: Secondary | ICD-10-CM

## 2014-09-06 DIAGNOSIS — R1031 Right lower quadrant pain: Secondary | ICD-10-CM | POA: Diagnosis not present

## 2014-09-06 DIAGNOSIS — K219 Gastro-esophageal reflux disease without esophagitis: Secondary | ICD-10-CM | POA: Diagnosis not present

## 2014-09-06 DIAGNOSIS — G8929 Other chronic pain: Secondary | ICD-10-CM

## 2014-09-06 DIAGNOSIS — Z86010 Personal history of colonic polyps: Secondary | ICD-10-CM

## 2014-09-06 NOTE — Progress Notes (Signed)
HISTORY OF PRESENT ILLNESS:  Lindsey Middleton is a 74 y.o. female with multiple medical problems as listed below including coronary artery disease for which she has had prior stent placement. She is referred by her otolaryngologist regarding dysphagia. Other complaints include chronic GERD and chronic right-sided abdominal pain. She has previously had extensive GI evaluation in [place] with Dr. Algis Greenhouse. He has retired. A large volume of outside records has been reviewed. Patient has undergone multiple colonoscopies and upper endoscopies previously. She underwent examinations in 2005 and again in 2014. Most recent examination revealed 3 small polyps which were removed and found to be adenomas. As well extensive left-sided diverticulosis. Upper endoscopy was normal. Also complaining of right-sided abdominal pain for years. Ultrasound in 2014 was negative. She is accompanied today by her husband. Her chief complaint is dysphagia to solid foods intermittently since her cervical spine surgery in July 2015. Items such as meat or problematic. She does have chronic GERD for which she is maintained on E some omeprazole in the morning and famotidine at night. No classic reflux symptoms with medication. She does have some bloating. She has had weight gain. He reports intermittent diarrhea. Her right-sided discomfort is mostly related to positional change.  REVIEW OF SYSTEMS:  All non-GI ROS negative except for sore throat, excessive urination, urinary frequency, urinary leakage, voice change  Past Medical History  Diagnosis Date  . Coronary artery disease     s/p Rotablator to mid RCA in 2/12  . Weakness   . Fatigue   . GERD (gastroesophageal reflux disease)   . Dyslipidemia   . Hypertension   . Varicose vein   . PONV (postoperative nausea and vomiting)   . Asthma   . Heart attack   . Rectal polyp     hyplastic    Past Surgical History  Procedure Laterality Date  . Coronary angioplasty  2/12     Rotablator to the RCA  . Abdominal hysterectomy  1996    total w/BSO  . Orif tibia & fibula fractures  2000  . Hardware removal  02/21/2011    Procedure: HARDWARE REMOVAL;  Surgeon: Laurice Record Aplington;  Location: [facility];  Service: Orthopedics;  Laterality: Left;  REMOVAL TWO SCREWS DISTAL LEFT TIBIA   . Leg surgery Left     rod, pins, screws   . Coronary stent placement    . Left heart catheterization with coronary angiogram N/A 02/12/2012    Procedure: LEFT HEART CATHETERIZATION WITH CORONARY ANGIOGRAM;  Surgeon: Peter M Martinique, MD;  Location: [facility] CATH LAB;  Service: Cardiovascular;  Laterality: N/A;  . Abdominal angiogram  02/12/2012    Procedure: ABDOMINAL ANGIOGRAM;  Surgeon: Peter M Martinique, MD;  Location: [facility] CATH LAB;  Service: Cardiovascular;;  . Polyp removal      Social History Lindsey Middleton  reports that she has never smoked. She has never used smokeless tobacco. She reports that she does not drink alcohol or use illicit drugs.  family history includes Cancer in her daughter and sister; Heart attack in her brother, brother, brother, father, mother, and sister; Heart disease in her brother, daughter, father, mother, and son; Hyperlipidemia in her daughter; Varicose Veins in her daughter.  Allergies  Allergen Reactions  . Morphine Anaphylaxis       PHYSICAL EXAMINATION: Vital signs: BP 126/84 mmHg  Pulse 80  Ht 5' 5.5" (1.664 m)  Wt 178 lb 4 oz (80.854 kg)  BMI 29.20 kg/m2  Constitutional: generally well-appearing, no acute  distress Psychiatric: alert and oriented x3, cooperative Eyes: extraocular movements intact, anicteric, conjunctiva pink Mouth: oral pharynx moist, no lesions Neck: supple no lymphadenopathy Cardiovascular: heart regular rate and rhythm, no murmur Lungs: clear to auscultation bilaterally Abdomen: soft, nontender, nondistended, no obvious ascites, no peritoneal signs, normal bowel sounds, no organomegaly Rectal:  Omitted Extremities: no clubbing cyanosis or lower extremity edema bilaterally Skin: no lesions on visible extremities Neuro: No focal deficits. No asterixis.    ASSESSMENT:  #1. Intermittent proximal solid food dysphagia as described. Concerned that this is related to scarring or hardware from her cervical spine surgery #2. GERD. Symptoms controlled with current acid suppressive therapy. Rule out peptic stricture. Normal EGD 2014 #3. Right-sided abdominal discomfort. Negative previous workups. Sounds musculoskeletal. #4. Multiple prior colonoscopies. Last examination 2014 with diminutive adenoma #5. Gen. medical problems. Stable   PLAN:  #1. Schedule barium esophagram with tablet to evaluate dysphagia and rule out extrinsic compression or stricture #2. Set up upper endoscopy. Would consider esophageal dilation if appropriate based on esophagram findings #3. Continue PPI and famotidine #4. Reflux precautions #5. Reassurance regarding right-sided discomfort #6. Multiple previous colonoscopies. Last colonoscopy with diminutive polyps only. No plans for routine surveillance at this time

## 2014-09-06 NOTE — Patient Instructions (Signed)
You have been scheduled for a Barium Esophogram at [facility] (1st floor of the hospital) on 09/08/2014 at 10:30am. Please arrive 15 minutes prior to your appointment for registration. Make certain not to have anything to eat or drink 3 hours prior to your test. If you need to reschedule for any reason, please contact radiology at [phone] to do so. __________________________________________________________________ A barium swallow is an examination that concentrates on views of the esophagus. This tends to be a double contrast exam (barium and two liquids which, when combined, create a gas to distend the wall of the oesophagus) or single contrast (non-ionic iodine based). The study is usually tailored to your symptoms so a good history is essential. Attention is paid during the study to the form, structure and configuration of the esophagus, looking for functional disorders (such as aspiration, dysphagia, achalasia, motility and reflux) EXAMINATION You may be asked to change into a gown, depending on the type of swallow being performed. A radiologist and radiographer will perform the procedure. The radiologist will advise you of the type of contrast selected for your procedure and direct you during the exam. You will be asked to stand, sit or lie in several different positions and to hold a small amount of fluid in your mouth before being asked to swallow while the imaging is performed .In some instances you may be asked to swallow barium coated marshmallows to assess the motility of a solid food bolus. The exam can be recorded as a digital or video fluoroscopy procedure. POST PROCEDURE It will take 1-2 days for the barium to pass through your system. To facilitate this, it is important, unless otherwise directed, to increase your fluids for the next 24-48hrs and to resume your normal diet.  This test typically takes about 30 minutes to  perform. __________________________________________________________________________________   Lindsey Middleton have been scheduled for an endoscopy. Please follow written instructions given to you at your visit today. If you use inhalers (even only as needed), please bring them with you on the day of your procedure.

## 2014-09-08 ENCOUNTER — Ambulatory Visit (HOSPITAL_COMMUNITY)
Admission: RE | Admit: 2014-09-08 | Discharge: 2014-09-08 | Disposition: A | Discharge status: Home / Self Care | Payer: Medicare Other | Source: Ambulatory Visit | Attending: Internal Medicine | Admitting: Internal Medicine

## 2014-09-08 DIAGNOSIS — Z9889 Other specified postprocedural states: Secondary | ICD-10-CM

## 2014-09-08 DIAGNOSIS — R1310 Dysphagia, unspecified: Secondary | ICD-10-CM | POA: Insufficient documentation

## 2014-09-18 ENCOUNTER — Ambulatory Visit (AMBULATORY_SURGERY_CENTER): Payer: Medicare Other | Admitting: Internal Medicine

## 2014-09-18 ENCOUNTER — Encounter: Payer: Self-pay | Admitting: Internal Medicine

## 2014-09-18 VITALS — BP 127/102 | HR 57 | Temp 98.4°F | Resp 29 | Ht 65.5 in | Wt 178.0 lb

## 2014-09-18 DIAGNOSIS — R1310 Dysphagia, unspecified: Secondary | ICD-10-CM | POA: Diagnosis not present

## 2014-09-18 DIAGNOSIS — K219 Gastro-esophageal reflux disease without esophagitis: Secondary | ICD-10-CM | POA: Diagnosis not present

## 2014-09-18 MED ORDER — SODIUM CHLORIDE 0.9 % IV SOLN: 500.0000 mL | INTRAVENOUS | Status: DC

## 2014-09-18 NOTE — Progress Notes (Signed)
A/ox3, pleased with MAC, report to RN 

## 2014-09-18 NOTE — Patient Instructions (Signed)
Normal EGD. Resume current medications. Dilation diet today.   YOU HAD AN ENDOSCOPIC PROCEDURE TODAY AT [facility]:   Refer to the procedure report that was given to you for any specific questions about what was found during the examination.  If the procedure report does not answer your questions, please call your gastroenterologist to clarify.  If you requested that your care partner not be given the details of your procedure findings, then the procedure report has been included in a sealed envelope for you to review at your convenience later.  YOU SHOULD EXPECT: Some feelings of bloating in the abdomen. Passage of more gas than usual.  Walking can help get rid of the air that was put into your GI tract during the procedure and reduce the bloating. If you had a lower endoscopy (such as a colonoscopy or flexible sigmoidoscopy) you may notice spotting of blood in your stool or on the toilet paper. If you underwent a bowel prep for your procedure, you may not have a normal bowel movement for a few days.  Please Note:  You might notice some irritation and congestion in your nose or some drainage.  This is from the oxygen used during your procedure.  There is no need for concern and it should clear up in a day or so.  SYMPTOMS TO REPORT IMMEDIATELY:    Following upper endoscopy (EGD)  Vomiting of blood or coffee ground material  New chest pain or pain under the shoulder blades  Painful or persistently difficult swallowing  New shortness of breath  Fever of 100F or higher  Black, tarry-looking stools  For urgent or emergent issues, a gastroenterologist can be reached at any hour by calling [phone].   DIET:  Dilation diet today, see handout given.  Drink plenty of fluids but you should avoid alcoholic beverages for 24 hours.  ACTIVITY:  You should plan to take it easy for the rest of today and you should NOT DRIVE or use heavy machinery until tomorrow (because of the  sedation medicines used during the test).    FOLLOW UP: Our staff will call the number listed on your records the next business day following your procedure to check on you and address any questions or concerns that you may have regarding the information given to you following your procedure. If we do not reach you, we will leave a message.  However, if you are feeling well and you are not experiencing any problems, there is no need to return our call.  We will assume that you have returned to your regular daily activities without incident.  If any biopsies were taken you will be contacted by phone or by letter within the next 1-3 weeks.  Please call us at [phone] if you have not heard about the biopsies in 3 weeks.    SIGNATURES/CONFIDENTIALITY: You and/or your care partner have signed paperwork which will be entered into your electronic medical record.  These signatures attest to the fact that that the information above on your After Visit Summary has been reviewed and is understood.  Full responsibility of the confidentiality of this discharge information lies with you and/or your care-partner.

## 2014-09-18 NOTE — Progress Notes (Signed)
Called to room to assist during endoscopic procedure.  Patient ID and intended procedure confirmed with present staff. Received instructions for my participation in the procedure from the performing physician.  

## 2014-09-18 NOTE — Op Note (Signed)
[facility]  [address] [place], [id number]   ENDOSCOPY PROCEDURE REPORT  PATIENT: [name], [name]  MR#: [id number] BIRTHDATE: 1940/11/13 , 56  yrs. old GENDER: female ENDOSCOPIST: Eustace Quail, MD REFERRED BY:  .  Self / Office PROCEDURE DATE:  09/18/2014 PROCEDURE:  EGD, diagnostic and Maloney dilation of esophagus   - 54 f ASA CLASS:     Class II INDICATIONS:  dysphagia. Marland Kitchen Recent esophagram unremarkable including passage of barium tablet. Neck hardware from previous surgery in good position MEDICATIONS: Monitored anesthesia care and Propofol 70 mg IV TOPICAL ANESTHETIC: none  DESCRIPTION OF PROCEDURE: After the risks benefits and alternatives of the procedure were thoroughly explained, informed consent was obtained.  The LB [id number] endoscope was introduced through the mouth and advanced to the second portion of the duodenum , Without limitations.  The instrument was slowly withdrawn as the mucosa was fully examined.  EXAM: The esophagus and gastroesophageal junction were completely normal in appearance.  The stomach was entered and closely examined.The antrum, angularis, and lesser curvature were well visualized, including a retroflexed view of the cardia and fundus. The stomach wall was normally distensable.  The scope passed easily through the pylorus into the duodenum.  Retroflexed views revealed no abnormalities.     The scope was then withdrawn from the patient and the procedure completed. THERAPY:54 [place] Maloney dilator was passed into the esophagus without resistance or heme. Tolerated well  COMPLICATIONS: There were no immediate complications.  ENDOSCOPIC IMPRESSION: 1. Normal EGD 2. Status post empiric dilation of the esophagus with 54 [place] Maloney  RECOMMENDATIONS: 1.  Clear liquids until 12 noon, then soft foods rest of day. Resume prior diet tomorrow. 2.  Continue current medications  REPEAT EXAM:  eSigned:  Eustace Quail, MD 09/18/2014 10:48 AM    CC:The Patient  : Lindsey Middleton

## 2014-09-19 ENCOUNTER — Telehealth: Payer: Self-pay | Admitting: *Deleted

## 2014-09-19 NOTE — Telephone Encounter (Signed)
  Follow up Call-  Call back number 09/18/2014  Post procedure Call Back phone  # [phone]  Permission to leave phone message Yes     Patient questions:  Do you have a fever, pain , or abdominal swelling? No. Pain Score  0 *  Have you tolerated food without any problems? Yes.    Have you been able to return to your normal activities? Yes.    Do you have any questions about your discharge instructions: Diet   No. Medications  No. Follow up visit  No.  Do you have questions or concerns about your Care? No.  Actions: * If pain score is 4 or above: No action needed, pain <4.

## 2015-05-16 ENCOUNTER — Other Ambulatory Visit: Payer: Self-pay | Admitting: Cardiology

## 2015-05-18 ENCOUNTER — Other Ambulatory Visit: Payer: Self-pay | Admitting: Cardiology

## 2015-05-23 ENCOUNTER — Ambulatory Visit (INDEPENDENT_AMBULATORY_CARE_PROVIDER_SITE_OTHER): Payer: Medicare Other | Admitting: Cardiology

## 2015-05-23 ENCOUNTER — Encounter: Payer: Self-pay | Admitting: Cardiology

## 2015-05-23 VITALS — BP 150/96 | HR 85 | Ht 65.0 in | Wt 175.0 lb

## 2015-05-23 DIAGNOSIS — Z01818 Encounter for other preprocedural examination: Secondary | ICD-10-CM

## 2015-05-23 DIAGNOSIS — R079 Chest pain, unspecified: Secondary | ICD-10-CM

## 2015-05-23 MED ORDER — ISOSORBIDE MONONITRATE ER 30 MG PO TB24: 15.0000 mg | ORAL_TABLET | Freq: Every day | ORAL | Status: DC

## 2015-05-23 NOTE — Patient Instructions (Addendum)
Medication Instructions:  Your physician has recommended you make the following change in your medication:  1.  START Imdur 30 mg taking 1/2 tablet daily   Labwork: None ordered  Testing/Procedures: Your physician has requested that you have a lexiscan myoview. For further information please visit [email or web address]. Please follow instruction sheet, as given.   Follow-Up: Your physician recommends that you schedule a follow-up appointment in: 3 MONTHS WITH 1ST AVAILABLE CARDIOLOGIST    Any Other Special Instructions Will Be Listed Below (If Applicable). Pharmacologic Stress Electrocardiogram A pharmacologic stress electrocardiogram is a heart (cardiac) test that uses nuclear imaging to evaluate the blood supply to your heart. This test may also be called a pharmacologic stress electrocardiography. Pharmacologic means that a medicine is used to increase your heart rate and blood pressure.  This stress test is done to find areas of poor blood flow to the heart by determining the extent of coronary artery disease (CAD). Some people exercise on a treadmill, which naturally increases the blood flow to the heart. For those people unable to exercise on a treadmill, a medicine is used. This medicine stimulates your heart and will cause your heart to beat harder and more quickly, as if you were exercising.  Pharmacologic stress tests can help determine:  The adequacy of blood flow to your heart during increased levels of activity in order to clear you for discharge home.  The extent of coronary artery blockage caused by CAD.  Your prognosis if you have suffered a heart attack.  The effectiveness of cardiac procedures done, such as an angioplasty, which can increase the circulation in your coronary arteries.  Causes of chest pain or pressure. LET [facility] CARE PROVIDER KNOW ABOUT:  Any allergies you have.  All medicines you are taking, including vitamins, herbs, eye drops, creams, and  over-the-counter medicines.  Previous problems you or members of your family have had with the use of anesthetics.  Any blood disorders you have.  Previous surgeries you have had.  Medical conditions you have.  Possibility of pregnancy, if this applies.  If you are currently breastfeeding. RISKS AND COMPLICATIONS Generally, this is a safe procedure. However, as with any procedure, complications can occur. Possible complications include:  You develop pain or pressure in the following areas:  Chest.  Jaw or neck.  Between your shoulder blades.  Radiating down your left arm.  Headache.  Dizziness or light-headedness.  Shortness of breath.  Increased or irregular heartbeat.  Low blood pressure.  Nausea or vomiting.  Flushing.  Redness going up the arm and slight pain during injection of medicine.  Heart attack (rare). BEFORE THE PROCEDURE   Avoid all forms of caffeine for 24 hours before your test or as directed by your health care provider. This includes coffee, tea (even decaffeinated tea), caffeinated sodas, chocolate, cocoa, and certain pain medicines.  Follow your health care provider's instructions regarding eating and drinking before the test.  Take your medicines as directed at regular times with water unless instructed otherwise. Exceptions may include:  If you have diabetes, ask how you are to take your insulin or pills. It is common to adjust insulin dosing the morning of the test.  If you are taking beta-blocker medicines, it is important to talk to your health care provider about these medicines well before the date of your test. Taking beta-blocker medicines may interfere with the test. In some cases, these medicines need to be changed or stopped 24 hours or more before the test.  If you wear a nitroglycerin patch, it may need to be removed prior to the test. Ask your health care provider if the patch should be removed before the test.  If you use an  inhaler for any breathing condition, bring it with you to the test.  If you are an outpatient, bring a snack so you can eat right after the stress phase of the test.  Do not smoke for 4 hours prior to the test or as directed by your health care provider.  Do not apply lotions, powders, creams, or oils on your chest prior to the test.  Wear comfortable shoes and clothing. Let your health care provider know if you were unable to complete or follow the preparations for your test. PROCEDURE   Multiple patches (electrodes) will be put on your chest. If needed, small areas of your chest may be shaved to get better contact with the electrodes. Once the electrodes are attached to your body, multiple wires will be attached to the electrodes, and your heart rate will be monitored.  An IV access will be started. A nuclear trace (isotope) is given. The isotope may be given intravenously, or it may be swallowed. Nuclear refers to several types of radioactive isotopes, and the nuclear isotope lights up the arteries so that the nuclear images are clear. The isotope is absorbed by your body. This results in low radiation exposure.  A resting nuclear image is taken to show how your heart functions at rest.  A medicine is given through the IV access.  A second scan is done about 1 hour after the medicine injection and determines how your heart functions under stress.  During this stress phase, you will be connected to an electrocardiogram machine. Your blood pressure and oxygen levels will be monitored. AFTER THE PROCEDURE   Your heart rate and blood pressure will be monitored after the test.  You may return to your normal schedule, including diet,activities, and medicines, unless your health care provider tells you otherwise.   This information is not intended to replace advice given to you by your health care provider. Make sure you discuss any questions you have with your health care provider.    Document Released: 06/29/2008 Document Revised: 02/15/2013 Document Reviewed: 10/18/2012 Elsevier Interactive Patient Education [employer].   If you need a refill on your cardiac medications before your next appointment, please call your pharmacy.

## 2015-05-23 NOTE — Progress Notes (Signed)
05/23/2015 Lindsey Middleton   September 27, 1940  LL:[id number]  Primary Physician Ephriam Jenkins E Primary Cardiologist: [name]   Reason for Visit/CC: Surgical Clearance for Ankle Surgery  HPI:  The patient is a 75 y/o female, previously followed by [name]. Last seen in 2015. She presents to clinic today for surgical clearance. She is tentatively scheduled to undergo ankle surgery by Dr. Percell Locus of [facility]. Her cardiac history is notable for ischemic heart disease, remote MI in 1986 with prior rotablator to the mid RCA in February of 2012, followed by placement of DES to the mid RCA. Her last cardiac catheterization was by Dr. Peter Martinique on 02/12/12 at which time he found nonobstructive coronary disease and her previous stent was widely patent. She had normal LV function. She had a normal abdominal aorta and no evidence of renal artery stenosis. She was cleared for neck surgery in 2012 and did well with anesthesia w/o complications. Her other PMH includes GERD, HLD, asthma, and varicose veins with past DVT. She has hypercholesterolemia and is on Lipitor which is monitored by her PCP.  She presents to clinic today with her husband and daughter. Her EKG today shows NSR w/o ischemia. HR is 85 bpm. No change from prior EKG in 2015. She does admit to mild substernal chest pressure that has occurred several times in the last month. Relieved with SL NTG. She also notes occasional mild DOE. She is currently CP free. BP is moderately elevated at 150/96.    Current Outpatient Prescriptions  Medication Sig Dispense Refill  . albuterol (PROVENTIL) (2.5 MG/3ML) 0.083% nebulizer solution Take 2.5 mg by nebulization every 6 (six) hours as needed. For shortness of breath    . ALPRAZolam (XANAX) 0.25 MG tablet TAKE ONE TABLET BY MOUTH ONCE DAILY 30 tablet 0  . aspirin 325 MG tablet Take 325 mg by mouth daily.      Marland Kitchen atorvastatin (LIPITOR) 80 MG tablet TAKE 1 TABLET EVERY DAY 30 tablet 0  .  esomeprazole (NEXIUM) 40 MG capsule Take 40 mg by mouth daily at 12 noon.    . famotidine (PEPCID) 40 MG tablet Take 1 tablet (40 mg total) by mouth at bedtime. 30 tablet 1  . fexofenadine (ALLEGRA) 180 MG tablet Take 180 mg by mouth daily.    . fluticasone (FLONASE) 50 MCG/ACT nasal spray Place 2 sprays into both nostrils 2 (two) times daily. 16 g 2  . Fluticasone-Salmeterol (ADVAIR) 250-50 MCG/DOSE AEPB Inhale 1 puff into the lungs 2 (two) times daily.    . metoprolol succinate (TOPROL-XL) 100 MG 24 hr tablet Take 50-100 mg by mouth 2 (two) times daily. Takes 1 tablets every morning and takes  tablet every night at bedtime.    . mometasone-formoterol (DULERA) 100-5 MCG/ACT AERO Not taking --- using advair right now d/t dulera cost    . montelukast (SINGULAIR) 10 MG tablet Take 1 tablet (10 mg total) by mouth at bedtime. 30 tablet 0  . nitroGLYCERIN (NITROSTAT) 0.4 MG SL tablet Place 1 tablet (0.4 mg total) under the tongue every 5 (five) minutes as needed. For chest pain 25 tablet 3  . oxymetazoline (AFRIN) 0.05 % nasal spray Place 2 sprays into both nostrils 2 (two) times daily. 30 mL 0  . prednisoLONE acetate (PRED FORTE) 1 % ophthalmic suspension Take 1 drop at 8, 12, 1600, 2000- both eyes    . Vitamin D, Ergocalciferol, (DRISDOL) 50000 UNITS CAPS capsule Take 50,000 Units by mouth every 7 (seven) days.  On fridays    . lisinopril (PRINIVIL,ZESTRIL) 10 MG tablet Take 10 mg by mouth daily.     No current facility-administered medications for this visit.    Allergies  Allergen Reactions  . Morphine Anaphylaxis    Social History   Social History  . Marital Status: Married    Spouse Name: N/A  . Number of Children: 4  . Years of Education: N/A   Occupational History  . retired    Social History Main Topics  . Smoking status: Never Smoker   . Smokeless tobacco: Never Used  . Alcohol Use: No  . Drug Use: No  . Sexual Activity: Not on file   Other Topics Concern  . Not on file     Social History Narrative     Review of Systems: General: negative for chills, fever, night sweats or weight changes.  Cardiovascular: negative for chest pain, dyspnea on exertion, edema, orthopnea, palpitations, paroxysmal nocturnal dyspnea or shortness of breath Dermatological: negative for rash Respiratory: negative for cough or wheezing Urologic: negative for hematuria Abdominal: negative for nausea, vomiting, diarrhea, bright red blood per rectum, melena, or hematemesis Neurologic: negative for visual changes, syncope, or dizziness All other systems reviewed and are otherwise negative except as noted above.    Blood pressure 150/96, pulse 85, height 5\' 5"  (1.651 m), weight 175 lb (79.379 kg), SpO2 96 %.  General appearance: alert, cooperative and no distress Neck: no carotid bruit and no JVD Lungs: clear to auscultation bilaterally Heart: regular rate and rhythm, S1, S2 normal, no murmur, click, rub or gallop Extremities: no LEE Pulses: 2+ and symmetric Skin: warm and dry Neurologic: Grossly normal  EKG NSR. No ischemia. Unchanged from prior EKG.   ASSESSMENT AND PLAN:   1. CAD: cardiac history is notable for ischemic heart disease, remote MI in 1986 with prior rotablator to the mid RCA in February of 2012, followed by placement of DES to the mid RCA. Her last cardiac catheterization was by Dr. Peter Martinique on 02/12/12 at which time he found nonobstructive coronary disease and her previous stent was widely patent. She had normal LV function. Her EKG shows NSR w/o ischemia and is unchanged from prior EKG, however she notes symptoms concerning for stable angina and DOE. We will postpone her surgery until we can obtain a NST to assess for ischemia. Given nitrate response chest discomfort and moderate hypertension, we will add Imdur, 15 mg daily.   2. 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 Scheduled to undergo surgery, in the  ture.  3. Surgical Clearance:  Her EKG shows NSR w/o ischemia and is unchanged from prior EKG, however she notes symptoms concerning for stable angina and DOE. Given her CAD history, we will postpone her surgery until we can obtain a NST to assess for ischemia. If negative NST, can clear for surgery.   PLAN  F/u if abnormal stress test. If normal stress test, patient can f/u in 3 months.   Lyda Jester PA-C 05/23/2015 2:31 PM

## 2015-05-24 ENCOUNTER — Telehealth (HOSPITAL_COMMUNITY): Payer: Self-pay | Admitting: *Deleted

## 2015-05-24 NOTE — Telephone Encounter (Signed)
Patient given detailed instructions per Myocardial Perfusion Study Information Sheet for the test on 05/28/15. Patient notified to arrive 15 minutes early and that it is imperative to arrive on time for appointment to keep from having the test rescheduled.  If you need to cancel or reschedule your appointment, please call the office within 24 hours of your appointment. Failure to do so may result in a cancellation of your appointment, and a $50 no show fee. Patient verbalized understanding.Joud Pettinato J Damisha Wolff, RN  

## 2015-05-28 ENCOUNTER — Telehealth: Payer: Self-pay | Admitting: Cardiology

## 2015-05-28 ENCOUNTER — Ambulatory Visit (HOSPITAL_COMMUNITY): Payer: Medicare Other | Attending: Cardiovascular Disease

## 2015-05-28 VITALS — Ht 65.0 in | Wt 175.0 lb

## 2015-05-28 DIAGNOSIS — R079 Chest pain, unspecified: Secondary | ICD-10-CM

## 2015-05-28 DIAGNOSIS — Z01818 Encounter for other preprocedural examination: Secondary | ICD-10-CM | POA: Diagnosis not present

## 2015-05-28 DIAGNOSIS — R110 Nausea: Secondary | ICD-10-CM

## 2015-05-28 DIAGNOSIS — I10 Essential (primary) hypertension: Secondary | ICD-10-CM | POA: Insufficient documentation

## 2015-05-28 DIAGNOSIS — R0789 Other chest pain: Secondary | ICD-10-CM | POA: Insufficient documentation

## 2015-05-28 DIAGNOSIS — R0609 Other forms of dyspnea: Secondary | ICD-10-CM | POA: Insufficient documentation

## 2015-05-28 DIAGNOSIS — R9439 Abnormal result of other cardiovascular function study: Secondary | ICD-10-CM | POA: Diagnosis not present

## 2015-05-28 DIAGNOSIS — G4440 Drug-induced headache, not elsewhere classified, not intractable: Secondary | ICD-10-CM

## 2015-05-28 LAB — MYOCARDIAL PERFUSION IMAGING
CHL CUP NUCLEAR SRS: 4
CHL CUP NUCLEAR SSS: 4
CSEPPHR: 105 {beats}/min
LHR: 0.3
LV sys vol: 27 mL
LVDIAVOL: 68 mL (ref 46–106)
NUC STRESS TID: 1.07
Rest HR: 80 {beats}/min
SDS: 0

## 2015-05-28 MED ORDER — REGADENOSON 0.4 MG/5ML IV SOLN
0.4000 mg | Freq: Once | INTRAVENOUS | Status: AC
  Administered 2015-05-28: 0.4 mg via INTRAVENOUS

## 2015-05-28 MED ORDER — AMINOPHYLLINE 25 MG/ML IV SOLN
75.0000 mg | Freq: Once | INTRAVENOUS | Status: AC
  Administered 2015-05-28: 75 mg via INTRAVENOUS

## 2015-05-28 MED ORDER — TECHNETIUM TC 99M SESTAMIBI GENERIC - CARDIOLITE
30.6000 | Freq: Once | INTRAVENOUS | Status: AC | PRN
  Administered 2015-05-28: 31 via INTRAVENOUS

## 2015-05-28 MED ORDER — TECHNETIUM TC 99M SESTAMIBI GENERIC - CARDIOLITE
10.3000 | Freq: Once | INTRAVENOUS | Status: AC | PRN
  Administered 2015-05-28: 10 via INTRAVENOUS

## 2015-05-28 NOTE — Telephone Encounter (Signed)
New message  ° ° °Patient calling stating someone called her today returning call back  °

## 2015-05-28 NOTE — Telephone Encounter (Signed)
Spoke with pt and informed her that I am unable to locate who called her . Pt states that she was here today for Lexiscan and they told her that they would call today with results because she has a pre op appt tomorrow. Pt states that surgeon's office will not do pre op appt tomorrow without the results of this test. Advised pt that  I will route this message to [employer], PA-C. Pt states to call her cell phone with the results so that they can get in touch with the surgeon's office or the surgeon tonight.

## 2015-05-29 ENCOUNTER — Encounter: Payer: Self-pay | Admitting: *Deleted

## 2015-05-29 NOTE — Telephone Encounter (Signed)
Faxed letter of clearance for surgery.

## 2015-05-29 NOTE — Telephone Encounter (Signed)
Nurse called in stating that she spoke with Lindsey Middleton yesterday in regards to the pt's clearance for surgery. She voiced that Dr. Sharmaine Base is requesting something in writing stating that she is cleared. Please fax this to [phone].

## 2015-07-04 ENCOUNTER — Ambulatory Visit: Payer: Medicare Other | Admitting: Nurse Practitioner

## 2015-08-07 ENCOUNTER — Encounter: Payer: Self-pay | Admitting: Nurse Practitioner

## 2015-08-07 ENCOUNTER — Ambulatory Visit (INDEPENDENT_AMBULATORY_CARE_PROVIDER_SITE_OTHER): Payer: Medicare Other | Admitting: Nurse Practitioner

## 2015-08-07 VITALS — BP 150/90 | HR 76 | Ht 66.0 in | Wt 176.0 lb

## 2015-08-07 DIAGNOSIS — I10 Essential (primary) hypertension: Secondary | ICD-10-CM

## 2015-08-07 DIAGNOSIS — I259 Chronic ischemic heart disease, unspecified: Secondary | ICD-10-CM | POA: Diagnosis not present

## 2015-08-07 DIAGNOSIS — E785 Hyperlipidemia, unspecified: Secondary | ICD-10-CM | POA: Diagnosis not present

## 2015-08-07 LAB — HEPATIC FUNCTION PANEL
ALT: 18 U/L (ref 6–29)
AST: 16 U/L (ref 10–35)
Albumin: 4.2 g/dL (ref 3.6–5.1)
Alkaline Phosphatase: 101 U/L (ref 33–130)
Bilirubin, Direct: 0.1 mg/dL (ref <=0.2)
Indirect Bilirubin: 0.3 mg/dL (ref 0.2–1.2)
Total Bilirubin: 0.4 mg/dL (ref 0.2–1.2)
Total Protein: 6.3 g/dL (ref 6.1–8.1)

## 2015-08-07 LAB — LIPID PANEL
Cholesterol: 200 mg/dL (ref 125–200)
HDL: 56 mg/dL (ref >=46)
LDL Cholesterol: 113 mg/dL (ref <130)
Total CHOL/HDL Ratio: 3.6 Ratio (ref <=5.0)
Triglycerides: 156 mg/dL — ABNORMAL HIGH (ref <150)
VLDL: 31 mg/dL — ABNORMAL HIGH (ref <30)

## 2015-08-07 LAB — BASIC METABOLIC PANEL
BUN: 24 mg/dL (ref 7–25)
CO2: 25 mmol/L (ref 20–31)
Calcium: 9.8 mg/dL (ref 8.6–10.4)
Chloride: 104 mmol/L (ref 98–110)
Creat: 0.57 mg/dL — ABNORMAL LOW (ref 0.60–0.93)
Glucose, Bld: 95 mg/dL (ref 65–99)
Potassium: 5 mmol/L (ref 3.5–5.3)
Sodium: 140 mmol/L (ref 135–146)

## 2015-08-07 MED ORDER — LISINOPRIL 20 MG PO TABS: 20.0000 mg | ORAL_TABLET | Freq: Every day | ORAL | Status: DC

## 2015-08-07 NOTE — Progress Notes (Signed)
CARDIOLOGY OFFICE NOTE  Date:  08/07/2015    Lindsey Middleton Date of Birth: [birth year] Medical Record [id number]  PCP:  Thea Alken  Cardiologist:  Former patient of Dr. Sherryl Barters - to establish with Dr. Oval Linsey   Chief Complaint  Patient presents with  . Coronary Artery Disease  . Hyperlipidemia  . Hypertension    3 month check - former patient of Dr. Sherryl Barters    History of Present Illness: Lindsey Middleton is a 75 y.o. female who presents today for a follow up visit. Former patient of Dr. Sherryl Barters.   She has a history of ischemic heart disease, remote MI in 1986 with prior rotablator to the mid RCA in February of 2012, followed by placement of DES to the mid RCA. Her last cardiac catheterization was by Dr. Peter Martinique on 02/12/12 at which time he found nonobstructive coronary disease and her previous stent was widely patent. She had normal LV function.  Other issues include GERD, HLD, asthma, and varicose veins with past DVT. She has hypercholesterolemia and is on Lipitor which is monitored by her PCP.  She was last seen here in March by Lyda Jester, PA for pre op clearance for ankle surgery - was having some chest pain - started on low dose Imdur and Myoview was updated - felt to be low risk and she was cleared.   Comes back today. Here with her husband and grand son in law. Using a walker. Had her ankle reconstruction since last visit here. In a walking cast/boot. No chest pain. Breathing ok.  Eating out more. BP higher at home and not at goal. Seems more worried about her husband than herself. Notes he is getting more demented.   Past Medical History  Diagnosis Date  . Coronary artery disease     s/p Rotablator to mid RCA in 2/12  . Weakness   . Fatigue   . GERD (gastroesophageal reflux disease)   . Dyslipidemia   . Hypertension   . Varicose vein   . PONV (postoperative nausea and vomiting)   . Asthma   . Rectal polyp     hyplastic  . Heart  attack ([place])     1986    Past Surgical History  Procedure Laterality Date  . Coronary angioplasty  2/12    Rotablator to the RCA  . Abdominal hysterectomy  1996    total w/BSO  . Orif tibia & fibula fractures  2000  . Hardware removal  02/21/2011    Procedure: HARDWARE REMOVAL;  Surgeon: Laurice Record Aplington;  Location: [facility];  Service: Orthopedics;  Laterality: Left;  REMOVAL TWO SCREWS DISTAL LEFT TIBIA   . Leg surgery Left     rod, pins, screws   . Coronary stent placement    . Left heart catheterization with coronary angiogram N/A 02/12/2012    Procedure: LEFT HEART CATHETERIZATION WITH CORONARY ANGIOGRAM;  Surgeon: Peter M Martinique, MD;  Location[facility] CATH LAB;  Service: Cardiovascular;  Laterality: N/A;  . Abdominal angiogram  02/12/2012    Procedure: ABDOMINAL ANGIOGRAM;  Surgeon: Peter M Martinique, MD;  Location: [facility] CATH LAB;  Service: Cardiovascular;;  . Polyp removal    . Spine surgery      2015  . Cataract extraction      june 2016, july 2016     Medications: Current Outpatient Prescriptions  Medication Sig Dispense Refill  . albuterol (PROVENTIL) (2.5 MG/3ML) 0.083% nebulizer solution Take 2.5 mg by  nebulization every 6 (six) hours as needed. For shortness of breath    . ALPRAZolam (XANAX) 0.25 MG tablet TAKE ONE TABLET BY MOUTH ONCE DAILY 30 tablet 0  . aspirin 325 MG tablet Take 325 mg by mouth daily.      Marland Kitchen atorvastatin (LIPITOR) 80 MG tablet TAKE 1 TABLET EVERY DAY 30 tablet 0  . doxycycline (VIBRA-TABS) 100 MG tablet Take 100 mg by mouth 2 (two) times daily.     Marland Kitchen esomeprazole (NEXIUM) 40 MG capsule Take 40 mg by mouth daily at 12 noon.    . famotidine (PEPCID) 40 MG tablet Take 1 tablet (40 mg total) by mouth at bedtime. 30 tablet 1  . fexofenadine (ALLEGRA) 180 MG tablet Take 180 mg by mouth daily.    . fluticasone (FLONASE) 50 MCG/ACT nasal spray Place 2 sprays into both nostrils 2 (two) times daily. 16 g 2  . Fluticasone-Salmeterol (ADVAIR)  250-50 MCG/DOSE AEPB Inhale 1 puff into the lungs 2 (two) times daily.    . isosorbide mononitrate (IMDUR) 30 MG 24 hr tablet Take 0.5 tablets (15 mg total) by mouth daily. 90 tablet 3  . LORazepam (ATIVAN) 0.5 MG tablet Take by mouth.    . metoprolol succinate (TOPROL-XL) 100 MG 24 hr tablet Take 50-100 mg by mouth 2 (two) times daily. Takes 1 tablets every morning and takes  tablet every night at bedtime.    . mometasone-formoterol (DULERA) 100-5 MCG/ACT AERO Not taking --- using advair right now d/t dulera cost    . montelukast (SINGULAIR) 10 MG tablet Take 1 tablet (10 mg total) by mouth at bedtime. 30 tablet 0  . nitroGLYCERIN (NITROSTAT) 0.4 MG SL tablet Place 1 tablet (0.4 mg total) under the tongue every 5 (five) minutes as needed. For chest pain 25 tablet 3  . oxymetazoline (AFRIN) 0.05 % nasal spray Place 2 sprays into both nostrils 2 (two) times daily. 30 mL 0  . prednisoLONE acetate (PRED FORTE) 1 % ophthalmic suspension Take 1 drop at 8, 12, 1600, 2000- both eyes    . Vitamin D, Ergocalciferol, (DRISDOL) 50000 UNITS CAPS capsule Take 50,000 Units by mouth every 7 (seven) days. On fridays    . lisinopril (PRINIVIL,ZESTRIL) 20 MG tablet Take 1 tablet (20 mg total) by mouth daily. 90 tablet 3   No current facility-administered medications for this visit.    Allergies: Allergies  Allergen Reactions  . Morphine Anaphylaxis  . Imdur [Isosorbide Dinitrate] Other (See Comments)    hypotension    Social History: The patient  reports that she has never smoked. She has never used smokeless tobacco. She reports that she does not drink alcohol or use illicit drugs.   Family History: The patient's family history includes Cancer in her daughter and sister; Heart attack in her brother, brother, brother, father, mother, and sister; Heart disease in her brother, daughter, father, mother, and son; Hyperlipidemia in her daughter; Varicose Veins in her daughter. There is no history of Colon  cancer.   Review of Systems: Please see the history of present illness.   Otherwise, the review of systems is positive for none.   All other systems are reviewed and negative.   Physical Exam: VS:  BP 150/90 mmHg  Pulse 76  Ht 5\' 6"  (1.676 m)  Wt 176 lb (79.833 kg)  BMI 28.42 kg/m2 .  BMI Body mass index is 28.42 kg/(m^2).  Wt Readings from Last 3 Encounters:  08/07/15 176 lb (79.833 kg)  05/28/15 175 lb (79.379  kg)  05/23/15 175 lb (79.379 kg)   BP recheck by me is 16/100  General: Pleasant. Seems a little anxious to me but alert and in no acute distress.  HEENT: Normal. Neck: Supple, no JVD, carotid bruits, or masses noted.  Cardiac: Regular rate and rhythm. No murmurs, rubs, or gallops. No edema.  Respiratory:  Lungs are clear to auscultation bilaterally with normal work of breathing.  GI: Soft and nontender.  MS: No deformity or atrophy. Gait and ROM intact. She has a walking boot on the right leg. Skin: Warm and dry. Color is normal.  Neuro:  Strength and sensation are intact and no gross focal deficits noted.  Psych: Alert, appropriate and with normal affect.   LABORATORY DATA:  EKG:  EKG is not ordered today.  Lab Results  Component Value Date   WBC 4.8 03/29/2013   HGB 14.2 03/29/2013   HCT 42.7 03/29/2013   PLT 204 03/29/2013   GLUCOSE 116* 04/05/2013   CHOL 286* 04/05/2013   TRIG 144.0 04/05/2013   HDL 52.50 04/05/2013   LDLDIRECT 215.3 04/05/2013   ALT 21 04/05/2013   AST 17 04/05/2013   NA 141 04/05/2013   K 4.6 04/05/2013   CL 109 04/05/2013   CREATININE 0.6 04/05/2013   BUN 16 04/05/2013   CO2 26 04/05/2013   INR 1.1* 02/09/2012    BNP (last 3 results) No results for input(s): BNP in the last 8760 hours.  ProBNP (last 3 results) No results for input(s): PROBNP in the last 8760 hours.   Other Studies Reviewed Today: Myoview Study Highlights from 05/2015     Nuclear stress EF: 60%.  No T wave inversion was noted during stress.  There  was no ST segment deviation noted during stress.  Defect 1: There is a small defect of mild severity.  This is a low risk study.  Small, mild fixed inferolateral defect, likely artifact. No reversible ischemia. LVEF 60% with normal wall motion. This is a low risk study.     Assessment/Plan: 1. CAD: No active chest pain. Recent Myoview stable. Would continue with medical management and CV risk factor modification.  2. Chronic Ankle Pain: with recent surgery - seems to be recovering nicely.  3. HLD - lab today  4. HTN - not controlled. Lisinopril increased to 20 mg a day. Lab today. She needs to continue to monitor at home.  Current medicines are reviewed with the patient today.  The patient does not have concerns regarding medicines other than what has been noted above.  The following changes have been made:  See above.  Labs/ tests ordered today include:    Orders Placed This Encounter  Procedures  . Basic metabolic panel  . Hepatic function panel  . Lipid panel     Disposition:   FU with Dr. Oval Linsey in 4 to 6 weeks.   Patient is agreeable to this plan and will call if any problems develop in the interim.   Signed: Burtis Junes, RN, ANP-C 08/07/2015 9:43 AM  [facility] [address] [place], [place]  [address] Phone: [phone] Fax: [phone]

## 2015-08-07 NOTE — Patient Instructions (Addendum)
We will be checking the following labs today - BMET, HPF and Lipids   Medication Instructions:    Continue with your current medicines. BUT  I am increasing the Lisinopril to 20 mg each day - take 2 of your 10 mg tablets to = this dose and use up. The RX for the 20 mg has been sent to your drug store.     Testing/Procedures To Be Arranged:  N/A  Follow-Up:   See Dr. Oval Linsey in 4 to 6 weeks.     Other Special Instructions:   Monitor your blood pressure at home - keep a record - bring to next appointment.     If you need a refill on your cardiac medications before your next appointment, please call your pharmacy.   Call the [place] office at [phone] if you have any questions, problems or concerns.

## 2015-08-10 ENCOUNTER — Telehealth: Payer: Self-pay | Admitting: Nurse Practitioner

## 2015-08-10 NOTE — Telephone Encounter (Signed)
Follow Up   Calliing about Lab results .Marland KitchenPlease call on Cell Number [phone]

## 2015-08-10 NOTE — Telephone Encounter (Signed)
Fu  Pt returning RN phone call- lab results. Please call back and discuss.   

## 2015-09-14 ENCOUNTER — Ambulatory Visit (HOSPITAL_COMMUNITY)
Admission: RE | Admit: 2015-09-14 | Discharge: 2015-09-14 | Disposition: A | Discharge status: Home / Self Care | Payer: Medicare Other | Source: Ambulatory Visit | Attending: Cardiovascular Disease | Admitting: Cardiovascular Disease

## 2015-09-14 ENCOUNTER — Ambulatory Visit (INDEPENDENT_AMBULATORY_CARE_PROVIDER_SITE_OTHER): Payer: Medicare Other | Admitting: Cardiovascular Disease

## 2015-09-14 ENCOUNTER — Encounter: Payer: Self-pay | Admitting: Cardiovascular Disease

## 2015-09-14 VITALS — BP 139/88 | HR 109 | Ht 66.0 in | Wt 172.6 lb

## 2015-09-14 DIAGNOSIS — I10 Essential (primary) hypertension: Secondary | ICD-10-CM | POA: Insufficient documentation

## 2015-09-14 DIAGNOSIS — R0602 Shortness of breath: Secondary | ICD-10-CM

## 2015-09-14 DIAGNOSIS — M7989 Other specified soft tissue disorders: Secondary | ICD-10-CM | POA: Insufficient documentation

## 2015-09-14 DIAGNOSIS — R531 Weakness: Secondary | ICD-10-CM

## 2015-09-14 DIAGNOSIS — R000 Tachycardia, unspecified: Secondary | ICD-10-CM | POA: Diagnosis not present

## 2015-09-14 DIAGNOSIS — R079 Chest pain, unspecified: Secondary | ICD-10-CM

## 2015-09-14 DIAGNOSIS — I259 Chronic ischemic heart disease, unspecified: Secondary | ICD-10-CM

## 2015-09-14 DIAGNOSIS — F419 Anxiety disorder, unspecified: Secondary | ICD-10-CM

## 2015-09-14 DIAGNOSIS — Z01818 Encounter for other preprocedural examination: Secondary | ICD-10-CM | POA: Diagnosis not present

## 2015-09-14 DIAGNOSIS — I2510 Atherosclerotic heart disease of native coronary artery without angina pectoris: Secondary | ICD-10-CM | POA: Insufficient documentation

## 2015-09-14 DIAGNOSIS — E785 Hyperlipidemia, unspecified: Secondary | ICD-10-CM | POA: Diagnosis not present

## 2015-09-14 DIAGNOSIS — K219 Gastro-esophageal reflux disease without esophagitis: Secondary | ICD-10-CM | POA: Diagnosis not present

## 2015-09-14 MED ORDER — ISOSORBIDE MONONITRATE ER 30 MG PO TB24: 30.0000 mg | ORAL_TABLET | Freq: Every day | ORAL | Status: DC

## 2015-09-14 MED ORDER — ESCITALOPRAM OXALATE 10 MG PO TABS: 10.0000 mg | ORAL_TABLET | Freq: Every day | ORAL | Status: DC

## 2015-09-14 NOTE — Progress Notes (Signed)
Cardiology Office Note   Date:  09/14/2015   ID:  Lindsey Middleton, DOB 10-Nov-1940, MRN [id number]  PCP:  Ephriam Jenkins E  Cardiologist:   Skeet Latch, MD   No chief complaint on file.    History of Present Illness: Lindsey Middleton is a 75 y.o. female with CAD, hypertension, hyperlipidemia and syncope who presents for follow up.  Lindsey Middleton was previously a patient of [name].  She had an MI in 1986 and in 2012 that was treated with DES to the mid RCA.  She had a cath in 2013 at which time the stent was patent and she had non-obstructive CAD.  She had a [employer] 05/2015 as a pre-op clearance and it was low risk for ischemia.  She last saw Lindsey Middleton 07/2015.  At that appointment her blood pressure was not well-controlled so lisinopril was increased to 20 mg daily.  She notes that her blood pressure has been better but often still runs in the 150-160s/90s.  She also notes that her heart rate has been consistently >100.  The highest she has seen is 131.  She endorses shortness of breath, dizziness and palpitations.  The episodes last for several minutes at a time.  She has not noted any lower extremity edema, orthopnea or PND.    Lindsey Middleton note some mild chest pressure with exertion and with stress.  She struggles with caring for her husband with worsening dementia.  She has also been feeling more stressed in the last three years because her daughter moved into their home, they are building an extension on their home, and she fractured her L ankle, which required reconstructive surgery.  She has taken Xanax in the past which helps her stress and her chest pain.  However, she is almost out and does not have a primary care physician to refill it.    Past Medical History  Diagnosis Date  . Coronary artery disease     s/p Rotablator to mid RCA in 2/12  . Weakness   . Fatigue   . GERD (gastroesophageal reflux disease)   . Dyslipidemia   . Hypertension   . Varicose vein   .  PONV (postoperative nausea and vomiting)   . Asthma   . Rectal polyp     hyplastic  . Heart attack ([place])     1986    Past Surgical History  Procedure Laterality Date  . Coronary angioplasty  2/12    Rotablator to the RCA  . Abdominal hysterectomy  1996    total w/BSO  . Orif tibia & fibula fractures  2000  . Hardware removal  02/21/2011    Procedure: HARDWARE REMOVAL;  Surgeon: Laurice Record Aplington;  Location: [facility];  Service: Orthopedics;  Laterality: Left;  REMOVAL TWO SCREWS DISTAL LEFT TIBIA   . Leg surgery Left     rod, pins, screws   . Coronary stent placement    . Left heart catheterization with coronary angiogram N/A 02/12/2012    Procedure: LEFT HEART CATHETERIZATION WITH CORONARY ANGIOGRAM;  Surgeon: Peter M Martinique, MD;  Location: [facility] CATH LAB;  Service: Cardiovascular;  Laterality: N/A;  . Abdominal angiogram  02/12/2012    Procedure: ABDOMINAL ANGIOGRAM;  Surgeon: Peter M Martinique, MD;  Location: [facility] CATH LAB;  Service: Cardiovascular;;  . Polyp removal    . Spine surgery      2015  . Cataract extraction      june 2016, july 2016  Current Outpatient Prescriptions  Medication Sig Dispense Refill  . albuterol (PROVENTIL) (2.5 MG/3ML) 0.083% nebulizer solution Take 2.5 mg by nebulization every 6 (six) hours as needed. For shortness of breath    . ALPRAZolam (XANAX) 0.25 MG tablet TAKE ONE TABLET BY MOUTH ONCE DAILY 30 tablet 0  . aspirin 325 MG tablet Take 325 mg by mouth daily.      Marland Kitchen atorvastatin (LIPITOR) 80 MG tablet TAKE 1 TABLET EVERY DAY 30 tablet 0  . esomeprazole (NEXIUM) 40 MG capsule Take 40 mg by mouth daily at 12 noon.    . famotidine (PEPCID) 40 MG tablet Take 1 tablet (40 mg total) by mouth at bedtime. 30 tablet 1  . fexofenadine (ALLEGRA) 180 MG tablet Take 180 mg by mouth daily.    . fluticasone (FLONASE) 50 MCG/ACT nasal spray Place 2 sprays into both nostrils 2 (two) times daily. 16 g 2  . Fluticasone-Salmeterol (ADVAIR) 250-50  MCG/DOSE AEPB Inhale 1 puff into the lungs 2 (two) times daily.    . isosorbide mononitrate (IMDUR) 30 MG 24 hr tablet Take 1 tablet (30 mg total) by mouth daily. 30 tablet 6  . lisinopril (PRINIVIL,ZESTRIL) 20 MG tablet Take 1 tablet (20 mg total) by mouth daily. 90 tablet 3  . LORazepam (ATIVAN) 0.5 MG tablet Take by mouth.    . metoprolol succinate (TOPROL-XL) 100 MG 24 hr tablet Take 50-100 mg by mouth 2 (two) times daily. Takes 1 tablets every morning and takes  tablet every night at bedtime.    . mometasone-formoterol (DULERA) 100-5 MCG/ACT AERO Not taking --- using advair right now d/t dulera cost    . montelukast (SINGULAIR) 10 MG tablet Take 1 tablet (10 mg total) by mouth at bedtime. 30 tablet 0  . nitroGLYCERIN (NITROSTAT) 0.4 MG SL tablet Place 1 tablet (0.4 mg total) under the tongue every 5 (five) minutes as needed. For chest pain 25 tablet 3  . oxymetazoline (AFRIN) 0.05 % nasal spray Place 2 sprays into both nostrils 2 (two) times daily. 30 mL 0  . prednisoLONE acetate (PRED FORTE) 1 % ophthalmic suspension Take 1 drop at 8, 12, 1600, 2000- both eyes    . Vitamin D, Ergocalciferol, (DRISDOL) 50000 UNITS CAPS capsule Take 50,000 Units by mouth every 7 (seven) days. On fridays    . escitalopram (LEXAPRO) 10 MG tablet Take 1 tablet (10 mg total) by mouth daily. 30 tablet 6   No current facility-administered medications for this visit.    Allergies:   Morphine and Imdur    Social History:  The patient  reports that she has never smoked. She has never used smokeless tobacco. She reports that she does not drink alcohol or use illicit drugs.   Family History:  The patient's family history includes Cancer in her daughter and sister; Heart attack in her brother, brother, brother, father, mother, and sister; Heart disease in her brother, daughter, father, mother, and son; Hyperlipidemia in her daughter; Varicose Veins in her daughter. There is no history of Colon cancer.    ROS:   Please see the history of present illness.   Otherwise, review of systems are positive for none.   All other systems are reviewed and negative.    PHYSICAL EXAM: VS:  BP 139/88 mmHg  Pulse 109  Ht 5\' 6"  (1.676 m)  Wt 172 lb 9.6 oz (78.291 kg)  BMI 27.87 kg/m2 , BMI Body mass index is 27.87 kg/(m^2). GENERAL:  Slightly anxious  Mild respiratory distress when  leaning back. HEENT:  Pupils equal round and reactive, fundi not visualized, oral mucosa unremarkable NECK:  No jugular venous distention, waveform within normal limits, carotid upstroke brisk and symmetric, no bruits, no thyromegaly LYMPHATICS:  No cervical adenopathy LUNGS:  Clear to auscultation bilaterally HEART:  RRR.  PMI not displaced or sustained,S1 and S2 within normal limits, no S3, no S4, no clicks, no rubs, no murmurs ABD:  Flat, positive bowel sounds normal in frequency in pitch, no bruits, no rebound, no guarding, no midline pulsatile mass, no hepatomegaly, no splenomegaly EXT:  2 plus pulses throughout, no edema, no cyanosis no clubbing SKIN:  No rashes no nodules NEURO:  Cranial nerves II through XII grossly intact, motor grossly intact throughout PSYCH:  Cognitively intact, oriented to person place and time   EKG:  EKG is ordered today. The ekg ordered today demonstrates sinus tachycardia rate 102 bpm.  Prior anterolateral and inferior infarcts.   Lexiscan Myoview 05/28/15:  Nuclear stress EF: 60%.  No T wave inversion was noted during stress.  There was no ST segment deviation noted during stress.  Defect 1: There is a small defect of mild severity.  This is a low risk study.  Small, mild fixed inferolateral defect, likely artifact. No reversible ischemia. LVEF 60% with normal wall motion. This is a low risk study.  Recent Labs: 08/07/2015: ALT 18; BUN 24; Creat 0.57*; Potassium 5.0; Sodium 140    Lipid Panel    Component Value Date/Time   CHOL 200 08/07/2015 0946   TRIG 156* 08/07/2015 0946   HDL 56  08/07/2015 0946   CHOLHDL 3.6 08/07/2015 0946   VLDL 31* 08/07/2015 0946   LDLCALC 113 08/07/2015 0946   LDLDIRECT 215.3 04/05/2013 1242      Wt Readings from Last 3 Encounters:  09/14/15 172 lb 9.6 oz (78.291 kg)  08/07/15 176 lb (79.833 kg)  05/28/15 175 lb (79.379 kg)      ASSESSMENT AND PLAN:  # CAD s/p MI and RCA PCI: Lindsey Middleton has intermittent episodes of chest discomfort that seem to be most related to stress.  She had a stress test that was negative for ischemia 05/2015.  We will add imdur 30 mg daily, which will also help with her hypertension.  Continue aspirin, metoprolol, and atorvastatin.  If her symptoms persist we will consider repeat LHC.  # Shortness of breath:  # Tachycardia:  Lindsey Middleton has increased shortness of breath and sinus tachycardia.  She had recent ankle surgery and is having lower extremity edema, which raises the concern for DVT and PE.  We will check a LLE Doppler, chest CT-A, CBC, TSH, free T4 and BMP.   She has no evidence of heart failure on exam.    # Anxiety/depression: Lindsey Middleton is very anxious and stress from dealing with her husband with dementia.  We will not refill her Xanax, but will start Lexapro 10 mg daily.  We will also refer her to a PCP to establish care.  Current medicines are reviewed at length with the patient today.  The patient does not have concerns regarding medicines.  The following changes have been made:  Start Imdur 30 mg daily   Labs/ tests ordered today include:   Orders Placed This Encounter  Procedures  . CT ANGIO CHEST PE W OR WO CONTRAST  . T4, free  . TSH  . BASIC METABOLIC PANEL WITH GFR  . CBC  . Ambulatory referral to Internal Medicine  . EKG 12-Lead  Disposition:   FU with Carie Kapuscinski C. Oval Linsey, MD, [facility] in 2 weeks   Time spent: 45 minutes-Greater than 50% of this time was spent in counseling, explanation of diagnosis, planning of further management, and coordination of care.    This note was written  with the assistance of speech recognition software.  Please excuse any transcriptional errors.  Signed, Erion Weightman C. Oval Linsey, MD, [facility]  09/14/2015 4:15 PM    [facility]

## 2015-09-14 NOTE — Patient Instructions (Addendum)
Medications  START Isosorbide mononitrate 30mg  1 tablet by mouth daily.  START Lexapro (escitalopram) 10 mg 1 tablet by mouth daily.   Lab work  Your physician recommends that you return for lab work.   Procedures  Chest CT  Your physician has requested that you have a lower or upper extremity venous duplex. This test is an ultrasound of the veins in the legs or arms. It looks at venous blood flow that carries blood from the heart to the legs or arms. Allow one hour for a Lower Venous exam. Allow thirty minutes for an Upper Venous exam. There are no restrictions or special instructions.   Referral  Dr. Oval Linsey has referred you to [facility] in [place].   Follow-up with Dr. Oval Linsey in 2 weeks.

## 2015-09-14 NOTE — Addendum Note (Signed)
Addended by: Alvina Filbert B on: 09/14/2015 04:41 PM   Modules accepted: Orders

## 2015-09-17 ENCOUNTER — Ambulatory Visit (INDEPENDENT_AMBULATORY_CARE_PROVIDER_SITE_OTHER)
Admission: RE | Admit: 2015-09-17 | Discharge: 2015-09-17 | Disposition: A | Discharge status: Home / Self Care | Payer: Medicare Other | Source: Ambulatory Visit | Attending: Cardiovascular Disease | Admitting: Cardiovascular Disease

## 2015-09-17 DIAGNOSIS — R079 Chest pain, unspecified: Secondary | ICD-10-CM

## 2015-09-19 ENCOUNTER — Telehealth: Payer: Self-pay | Admitting: *Deleted

## 2015-09-19 NOTE — Telephone Encounter (Signed)
-----   Message from Skeet Latch, MD sent at 09/19/2015  1:32 PM EDT ----- No PE.  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 We will need to repeat the imaging in 1 year.

## 2015-09-19 NOTE — Telephone Encounter (Signed)
-----   Message from Skeet Latch, MD sent at 09/19/2015  1:31 PM EDT ----- No DVT.

## 2015-09-19 NOTE — Telephone Encounter (Signed)
Advised patient of results.  

## 2015-09-19 NOTE — Telephone Encounter (Signed)
F/U   Pt returning nurse call. Please call.

## 2015-09-19 NOTE — Telephone Encounter (Signed)
Left message to call back  

## 2015-10-08 ENCOUNTER — Ambulatory Visit (INDEPENDENT_AMBULATORY_CARE_PROVIDER_SITE_OTHER): Payer: Medicare Other | Admitting: Cardiovascular Disease

## 2015-10-08 ENCOUNTER — Encounter: Payer: Self-pay | Admitting: Cardiovascular Disease

## 2015-10-08 DIAGNOSIS — I7121 Aneurysm of the ascending aorta, without rupture: Secondary | ICD-10-CM

## 2015-10-08 DIAGNOSIS — I259 Chronic ischemic heart disease, unspecified: Secondary | ICD-10-CM

## 2015-10-08 DIAGNOSIS — I712 Thoracic aortic aneurysm, without rupture: Secondary | ICD-10-CM

## 2015-10-08 MED ORDER — METOPROLOL SUCCINATE ER 25 MG PO TB24: 25.0000 mg | ORAL_TABLET | Freq: Every day | ORAL | 5 refills | Status: DC

## 2015-10-08 NOTE — Patient Instructions (Signed)
Medication Instructions:  START METOPROLOL SUCC 25 MG DAILY  Labwork: NONE  Testing/Procedures: NONE  Follow-Up: Your physician recommends that you schedule a follow-up appointment in: 2 MONTHS WITH DR Erlanger Bledsoe  Your physician recommends that you schedule a follow-up appointment in: [place]  If you need a refill on your cardiac medications before your next appointment, please call your pharmacy.

## 2015-10-08 NOTE — Progress Notes (Signed)
Cardiology Office Note   Date:  10/12/2015   ID:  Lindsey Middleton, DOB [birth year], MRN [id number]  PCP:  No PCP Per Patient  Cardiologist:   Skeet Latch, MD   Chief Complaint  Patient presents with  . 2 week f/u    pt c/o SOB on exertion; no other Sx.     History of Present Illness: Lindsey Middleton is a 75 y.o. female with CAD, hypertension, hyperlipidemia, mild ascending aortic aneurysm, and syncope who presents for follow up.  Lindsey Middleton was previously a patient of [name].  She had an MI in 1986 and in 2012 that was treated with DES to the mid RCA.  She had a cath in 2013 at which time the stent was patent and she had non-obstructive CAD.  She had a [employer] 05/2015 as a pre-op clearance and it was low risk for ischemia.  She last saw Lindsey Middleton 07/2015.  At that appointment her blood pressure was not well-controlled so lisinopril was increased to 20 mg daily.  Her blood pressure was still poorly controlled on 09/14/15.  Imdur was added to her regimen due to chest discomfort and poorly controlled hypertension. She was also noted to be tachycardic and reported increased shortness of breath as well as lower extremity edema. Therefore, she was referred for lower extremity Dopplers that were negative for DVT.  She had a chest CT-A on 09/17/15 that was negative for pulmonary embolism. It showed moderate coronary artery calcifications and a mild ascending aortic aneurysm.  Since her last appointment Lindsey Middleton has been feeling better.  She continues to note some shortness of breath with exertion such as vacuuming her home. She denies any chest pain or pressure. She also has not noted any lower extremity edema, orthopnea, or PND. She had surgery on her left ankle in July and this continues to improve his well. She has not yet been able to start exercising regularly.   Past Medical History:  Diagnosis Date  . Ascending aortic aneurysm (HCC)    4.2 cm by CT 08/2015.  Repeat in 1 year.   . Asthma   . Coronary artery disease    s/p Rotablator to mid RCA in 2/12  . Dyslipidemia   . Fatigue   . GERD (gastroesophageal reflux disease)   . Heart attack ([place])    1986  . Hypertension   . PONV (postoperative nausea and vomiting)   . Rectal polyp    hyplastic  . Varicose vein   . Weakness     Past Surgical History:  Procedure Laterality Date  . ABDOMINAL ANGIOGRAM  02/12/2012   Procedure: ABDOMINAL ANGIOGRAM;  Surgeon: Peter M Martinique, MD;  Location: [facility] CATH LAB;  Service: Cardiovascular;;  . ABDOMINAL HYSTERECTOMY  1996   total w/BSO  . CATARACT EXTRACTION     june 2016, july 2016  . CORONARY ANGIOPLASTY  2/12   Rotablator to the RCA  . CORONARY STENT PLACEMENT    . HARDWARE REMOVAL  02/21/2011   Procedure: HARDWARE REMOVAL;  Surgeon: Laurice Record Aplington;  Location: [place];  Service: Orthopedics;  Laterality: Left;  REMOVAL TWO SCREWS DISTAL LEFT TIBIA   . LEFT HEART CATHETERIZATION WITH CORONARY ANGIOGRAM N/A 02/12/2012   Procedure: LEFT HEART CATHETERIZATION WITH CORONARY ANGIOGRAM;  Surgeon: Peter M Martinique, MD;  Location[facility] CATH LAB;  Service: Cardiovascular;  Laterality: N/A;  . LEG SURGERY Left    rod, pins, screws   . ORIF TIBIA &  FIBULA FRACTURES  2000  . polyp removal    . SPINE SURGERY     2015     Current Outpatient Prescriptions  Medication Sig Dispense Refill  . albuterol (PROVENTIL) (2.5 MG/3ML) 0.083% nebulizer solution Take 2.5 mg by nebulization every 6 (six) hours as needed. For shortness of breath    . ALPRAZolam (XANAX) 0.25 MG tablet TAKE ONE TABLET BY MOUTH ONCE DAILY 30 tablet 0  . aspirin 325 MG tablet Take 325 mg by mouth daily.      Marland Kitchen atorvastatin (LIPITOR) 80 MG tablet TAKE 1 TABLET EVERY DAY 30 tablet 0  . esomeprazole (NEXIUM) 40 MG capsule Take 40 mg by mouth daily at 12 noon.    . famotidine (PEPCID) 40 MG tablet Take 1 tablet (40 mg total) by mouth at bedtime. 30 tablet 1  . fexofenadine (ALLEGRA) 180 MG  tablet Take 180 mg by mouth daily.    . fluticasone (FLONASE) 50 MCG/ACT nasal spray Place 2 sprays into both nostrils 2 (two) times daily. 16 g 2  . Fluticasone-Salmeterol (ADVAIR) 250-50 MCG/DOSE AEPB Inhale 1 puff into the lungs 2 (two) times daily.    Marland Kitchen lisinopril (PRINIVIL,ZESTRIL) 20 MG tablet Take 1 tablet (20 mg total) by mouth daily. 90 tablet 3  . LORazepam (ATIVAN) 0.5 MG tablet Take by mouth.    . mometasone-formoterol (DULERA) 100-5 MCG/ACT AERO Not taking --- using advair right now d/t dulera cost    . montelukast (SINGULAIR) 10 MG tablet Take 1 tablet (10 mg total) by mouth at bedtime. 30 tablet 0  . nitroGLYCERIN (NITROSTAT) 0.4 MG SL tablet Place 1 tablet (0.4 mg total) under the tongue every 5 (five) minutes as needed. For chest pain 25 tablet 3  . oxymetazoline (AFRIN) 0.05 % nasal spray Place 2 sprays into both nostrils 2 (two) times daily. 30 mL 0  . prednisoLONE acetate (PRED FORTE) 1 % ophthalmic suspension Take 1 drop at 8, 12, 1600, 2000- both eyes    . Vitamin D, Ergocalciferol, (DRISDOL) 50000 UNITS CAPS capsule Take 50,000 Units by mouth every 7 (seven) days. On fridays    . escitalopram (LEXAPRO) 10 MG tablet Take 1 tablet (10 mg total) by mouth daily. 90 tablet 0  . isosorbide mononitrate (IMDUR) 30 MG 24 hr tablet Take 1 tablet (30 mg total) by mouth daily. 90 tablet 3  . metoprolol succinate (TOPROL XL) 25 MG 24 hr tablet Take 1 tablet (25 mg total) by mouth daily. 30 tablet 5   No current facility-administered medications for this visit.     Allergies:   Morphine and Imdur [isosorbide dinitrate]    Social History:  The patient  reports that she has never smoked. She has never used smokeless tobacco. She reports that she does not drink alcohol or use drugs.   Family History:  The patient's family history includes Cancer in her daughter and sister; Heart attack in her brother, brother, brother, father, mother, and sister; Heart disease in her brother, daughter,  father, mother, and son; Hyperlipidemia in her daughter; Varicose Veins in her daughter.    ROS:  Please see the history of present illness.   Otherwise, review of systems are positive for none.   All other systems are reviewed and negative.    PHYSICAL EXAM: VS:  BP 140/84 (BP Location: Left Arm, Patient Position: Sitting, Cuff Size: Normal)   Pulse 83   Ht 5\' 6"  (1.676 m)   Wt 171 lb 12.8 oz (77.9 kg)  BMI 27.73 kg/m  , BMI Body mass index is 27.73 kg/m. GENERAL:  Slightly anxious  Mild respiratory distress when leaning back. HEENT:  Pupils equal round and reactive, fundi not visualized, oral mucosa unremarkable NECK:  No jugular venous distention, waveform within normal limits, carotid upstroke brisk and symmetric, no bruits, no thyromegaly LYMPHATICS:  No cervical adenopathy LUNGS:  Clear to auscultation bilaterally HEART:  RRR.  PMI not displaced or sustained,S1 and S2 within normal limits, no S3, no S4, no clicks, no rubs, no murmurs ABD:  Flat, positive bowel sounds normal in frequency in pitch, no bruits, no rebound, no guarding, no midline pulsatile mass, no hepatomegaly, no splenomegaly EXT:  2 plus pulses throughout, no edema, no cyanosis no clubbing SKIN:  No rashes no nodules NEURO:  Cranial nerves II through XII grossly intact, motor grossly intact throughout PSYCH:  Cognitively intact, oriented to person place and time   EKG:  EKG is ordered today. The ekg ordered today demonstrates sinus bradycardia rate 58 bpm.  Prior inferior infarct.   Lexiscan Myoview 05/28/15:  Nuclear stress EF: 60%.  No T wave inversion was noted during stress.  There was no ST segment deviation noted during stress.  Defect 1: There is a small defect of mild severity.  This is a low risk study.  Small, mild fixed inferolateral defect, likely artifact. No reversible ischemia. LVEF 60% with normal wall motion. This is a low risk study.  Recent Labs: 08/07/2015: ALT 18; BUN 24; Creat  0.57; Potassium 5.0; Sodium 140    Lipid Panel    Component Value Date/Time   CHOL 200 08/07/2015 0946   TRIG 156 (H) 08/07/2015 0946   HDL 56 08/07/2015 0946   CHOLHDL 3.6 08/07/2015 0946   VLDL 31 (H) 08/07/2015 0946   LDLCALC 113 08/07/2015 0946   LDLDIRECT 215.3 04/05/2013 1242      Wt Readings from Last 3 Encounters:  10/08/15 171 lb 12.8 oz (77.9 kg)  09/14/15 172 lb 9.6 oz (78.3 kg)  08/07/15 176 lb (79.8 kg)      ASSESSMENT AND PLAN:  # CAD s/p MI and RCA PCI: Lindsey Middleton has intermittent episodes of chest discomfort that seem to be most related to stress.  She had a stress test that was negative for ischemia 05/2015. This has been better since adding Imdur.  Continue aspirin, metoprolol, and atorvastatin.   # Shortness of breath:  # Tachycardia:  Lindsey Middleton has increased shortness of breath and sinus tachycardia. Lower extremity Doppler was negative for DVT and she did not have a pulmonary embolism on CT scan. It is unclear what is causing her tachycardia, though I wonder if anxiety may be contributing. All her laboratory testing thus far has been unremarkable.  She will get her thyroid checked today. We will start metoprolol 25 mg daily.   # Anxiety/depression: Lindsey Middleton is very anxious and stress from dealing with her husband with dementia.  She was started on Lexapro at her last appointment. She seems a little less anxious today.  She was encouraged to follow up with her PCP.  # HL: Lindsey Middleton cholesterol is >100 despite being on atorvastatin 80 mg daily.  We will refer her to lipid clinic for consideration of a PCSK9 inhibitor.  She has known CAD.  Current medicines are reviewed at length with the patient today.  The patient does not have concerns regarding medicines.  The following changes have been made: metoprolol 25 mg daily.  Labs/ tests ordered  today include:   No orders of the defined types were placed in this encounter.    Disposition:   FU with Lindsey Helin C.  Oval Linsey, MD, [facility] in 2 months  Time spent: 24 minutes-Greater than 50% of this time was spent in counseling, explanation of diagnosis, planning of further management, and coordination of care.    This note was written with the assistance of speech recognition software.  Please excuse any transcriptional errors.  Signed, Amyrie Illingworth C. Oval Linsey, MD, [facility]  10/12/2015 1:54 PM    [facility]

## 2015-10-10 ENCOUNTER — Other Ambulatory Visit: Payer: Self-pay

## 2015-10-10 DIAGNOSIS — Z01818 Encounter for other preprocedural examination: Secondary | ICD-10-CM

## 2015-10-10 DIAGNOSIS — R079 Chest pain, unspecified: Secondary | ICD-10-CM

## 2015-10-10 MED ORDER — ISOSORBIDE MONONITRATE ER 30 MG PO TB24: 30.0000 mg | ORAL_TABLET | Freq: Every day | ORAL | 3 refills | Status: DC

## 2015-10-11 ENCOUNTER — Other Ambulatory Visit: Payer: Self-pay | Admitting: *Deleted

## 2015-10-11 MED ORDER — ESCITALOPRAM OXALATE 10 MG PO TABS: 10.0000 mg | ORAL_TABLET | Freq: Every day | ORAL | 0 refills | Status: DC

## 2015-10-31 ENCOUNTER — Ambulatory Visit: Payer: Medicare Other

## 2015-12-07 ENCOUNTER — Ambulatory Visit (INDEPENDENT_AMBULATORY_CARE_PROVIDER_SITE_OTHER): Payer: Medicare Other | Admitting: Cardiovascular Disease

## 2015-12-07 ENCOUNTER — Encounter: Payer: Self-pay | Admitting: Cardiovascular Disease

## 2015-12-07 VITALS — BP 137/77 | HR 81 | Ht 66.0 in | Wt 171.8 lb

## 2015-12-07 DIAGNOSIS — R0789 Other chest pain: Secondary | ICD-10-CM | POA: Diagnosis not present

## 2015-12-07 DIAGNOSIS — I259 Chronic ischemic heart disease, unspecified: Secondary | ICD-10-CM | POA: Diagnosis not present

## 2015-12-07 DIAGNOSIS — R0602 Shortness of breath: Secondary | ICD-10-CM

## 2015-12-07 DIAGNOSIS — R000 Tachycardia, unspecified: Secondary | ICD-10-CM

## 2015-12-07 DIAGNOSIS — E7800 Pure hypercholesterolemia, unspecified: Secondary | ICD-10-CM

## 2015-12-07 MED ORDER — ESCITALOPRAM OXALATE 10 MG PO TABS: 10.0000 mg | ORAL_TABLET | Freq: Every day | ORAL | 1 refills | Status: DC

## 2015-12-07 NOTE — Progress Notes (Signed)
Cardiology Office Note   Date:  12/07/2015   ID:  COVE IGLEHEART, DOB Sep 01, 1940, MRN [id number]  PCP:  No PCP Per Patient  Cardiologist:   Skeet Latch, MD   Chief Complaint  Patient presents with  . Follow-up    2 months; Pt states no Sx.      History of Present Illness: Lindsey Middleton is a 75 y.o. female with CAD, hypertension, hyperlipidemia, mild ascending aortic aneurysm, and syncope who presents for follow up.  Lindsey Middleton was previously a patient of [name].  She had an MI in 1986 and in 2012 that was treated with DES to the mid RCA.  She had a cath in 2013 at which time the stent was patent and she had non-obstructive CAD.  She had a [employer] 05/2015 as a pre-op clearance and it was low risk for ischemia.  She last saw [name] 07/2015.  At that appointment her blood pressure was not well-controlled so lisinopril was increased to 20 mg daily.  Her blood pressure was still poorly controlled on 09/14/15.  Imdur was added to her regimen due to chest discomfort and poorly controlled hypertension. She was also noted to be tachycardic and reported increased shortness of breath as well as lower extremity edema. Therefore, she was referred for lower extremity Dopplers that were negative for DVT.  She had a chest CT-A on 09/17/15 that was negative for pulmonary embolism. It showed moderate coronary artery calcifications and a mild ascending aortic aneurysm.  Since her last appointment Lindsey Middleton has been feeling better.  Her shortness of breath have improved since metoprolol was added to her regimen.  She also notes that her mood has improved on Lexapro.  Her main complaint is difficulty healing her left ankle.  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 She continues to exercise by walking daily for half a mile. She denies any chest pain or shortness of breath with this activity. She also denies lower extremity edema with the exception of swelling of  her ankle.  Past Medical History:  Diagnosis Date  . Ascending aortic aneurysm (HCC)    4.2 cm by CT 08/2015.  Repeat in 1 year.  . Asthma   . Coronary artery disease    s/p Rotablator to mid RCA in 2/12  . Dyslipidemia   . Fatigue   . GERD (gastroesophageal reflux disease)   . Heart attack    1986  . Hypertension   . PONV (postoperative nausea and vomiting)   . Rectal polyp    hyplastic  . Varicose vein   . Weakness     Past Surgical History:  Procedure Laterality Date  . ABDOMINAL ANGIOGRAM  02/12/2012   Procedure: ABDOMINAL ANGIOGRAM;  Surgeon: Peter M Martinique, MD;  Location: [facility] CATH LAB;  Service: Cardiovascular;;  . ABDOMINAL HYSTERECTOMY  1996   total w/BSO  . CATARACT EXTRACTION     june 2016, july 2016  . CORONARY ANGIOPLASTY  2/12   Rotablator to the RCA  . CORONARY STENT PLACEMENT    . HARDWARE REMOVAL  02/21/2011   Procedure: HARDWARE REMOVAL;  Surgeon: Laurice Record Aplington;  Location: [place];  Service: Orthopedics;  Laterality: Left;  REMOVAL TWO SCREWS DISTAL LEFT TIBIA   . LEFT HEART CATHETERIZATION WITH CORONARY ANGIOGRAM N/A 02/12/2012   Procedure: LEFT HEART CATHETERIZATION WITH CORONARY ANGIOGRAM;  Surgeon: Peter M Martinique, MD;  Location[facility] CATH LAB;  Service: Cardiovascular;  Laterality: N/A;  . LEG SURGERY Left    rod, pins, screws   . ORIF TIBIA & FIBULA FRACTURES  2000  . polyp removal    . SPINE SURGERY     2015     Current Outpatient Prescriptions  Medication Sig Dispense Refill  . albuterol (PROVENTIL) (2.5 MG/3ML) 0.083% nebulizer solution Take 2.5 mg by nebulization every 6 (six) hours as needed. For shortness of breath    . ALPRAZolam (XANAX) 0.25 MG tablet TAKE ONE TABLET BY MOUTH ONCE DAILY 30 tablet 0  . aspirin 325 MG tablet Take 325 mg by mouth daily.      Marland Kitchen atorvastatin (LIPITOR) 80 MG tablet TAKE 1 TABLET EVERY DAY 30 tablet 0  . escitalopram (LEXAPRO) 10 MG tablet Take 1 tablet (10 mg total) by mouth daily. 90  tablet 1  . esomeprazole (NEXIUM) 40 MG capsule Take 40 mg by mouth daily at 12 noon.    . famotidine (PEPCID) 40 MG tablet Take 1 tablet (40 mg total) by mouth at bedtime. 30 tablet 1  . fexofenadine (ALLEGRA) 180 MG tablet Take 180 mg by mouth daily.    . fluticasone (FLONASE) 50 MCG/ACT nasal spray Place 2 sprays into both nostrils 2 (two) times daily. 16 g 2  . Fluticasone-Salmeterol (ADVAIR) 250-50 MCG/DOSE AEPB Inhale 1 puff into the lungs 2 (two) times daily.    . isosorbide mononitrate (IMDUR) 30 MG 24 hr tablet Take 1 tablet (30 mg total) by mouth daily. 90 tablet 3  . lisinopril (PRINIVIL,ZESTRIL) 20 MG tablet Take 1 tablet (20 mg total) by mouth daily. 90 tablet 3  . LORazepam (ATIVAN) 0.5 MG tablet Take by mouth.    . metoprolol succinate (TOPROL XL) 25 MG 24 hr tablet Take 1 tablet (25 mg total) by mouth daily. 30 tablet 5  . mometasone-formoterol (DULERA) 100-5 MCG/ACT AERO Not taking --- using advair right now d/t dulera cost    . montelukast (SINGULAIR) 10 MG tablet Take 1 tablet (10 mg total) by mouth at bedtime. 30 tablet 0  . nitroGLYCERIN (NITROSTAT) 0.4 MG SL tablet Place 1 tablet (0.4 mg total) under the tongue every 5 (five) minutes as needed. For chest pain 25 tablet 3  . oxymetazoline (AFRIN) 0.05 % nasal spray Place 2 sprays into both nostrils 2 (two) times daily. 30 mL 0  . prednisoLONE acetate (PRED FORTE) 1 % ophthalmic suspension Take 1 drop at 8, 12, 1600, 2000- both eyes    . Vitamin D, Ergocalciferol, (DRISDOL) 50000 UNITS CAPS capsule Take 50,000 Units by mouth every 7 (seven) days. On fridays     No current facility-administered medications for this visit.     Allergies:   Morphine and Imdur [isosorbide dinitrate]    Social History:  The patient  reports that she has never smoked. She has never used smokeless tobacco. She reports that she does not drink alcohol or use drugs.   Family History:  The patient's family history includes Cancer in her daughter and  sister; Heart attack in her brother, brother, brother, father, mother, and sister; Heart disease in her brother, daughter, father, mother, and son; Hyperlipidemia in her daughter; Varicose Veins in her daughter.    ROS:  Please see the history of present illness.   Otherwise, review of systems are positive for none.   All other systems are reviewed and negative.    PHYSICAL EXAM: VS:  BP 137/77   Pulse 81   Ht 5\' 6"  (1.676 m)  Wt 171 lb 12.8 oz (77.9 kg)   BMI 27.73 kg/m  , BMI Body mass index is 27.73 kg/m. GENERAL:  Slightly anxious  Mild respiratory distress when leaning back. HEENT:  Pupils equal round and reactive, fundi not visualized, oral mucosa unremarkable NECK:  No jugular venous distention, waveform within normal limits, carotid upstroke brisk and symmetric, no bruits LYMPHATICS:  No cervical adenopathy LUNGS:  Clear to auscultation bilaterally HEART:  RRR.  PMI not displaced or sustained,S1 and S2 within normal limits, no S3, no S4, no clicks, no rubs, no murmurs ABD:  Flat, positive bowel sounds normal in frequency in pitch, no bruits, no rebound, no guarding, no midline pulsatile mass, no hepatomegaly, no splenomegaly EXT:  2 plus pulses throughout, no edema, no cyanosis no clubbing SKIN:  No rashes no nodules NEURO:  Cranial nerves II through XII grossly intact, motor grossly intact throughout PSYCH:  Cognitively intact, oriented to person place and time   EKG:  EKG is ordered today. The ekg ordered today demonstrates sinus bradycardia rate 58 bpm.  Prior inferior infarct.   Lexiscan Myoview 05/28/15:  Nuclear stress EF: 60%.  No T wave inversion was noted during stress.  There was no ST segment deviation noted during stress.  Defect 1: There is a small defect of mild severity.  This is a low risk study.  Small, mild fixed inferolateral defect, likely artifact. No reversible ischemia. LVEF 60% with normal wall motion. This is a low risk study.  Recent  Labs: 08/07/2015: ALT 18; BUN 24; Creat 0.57; Potassium 5.0; Sodium 140    Lipid Panel    Component Value Date/Time   CHOL 200 08/07/2015 0946   TRIG 156 (H) 08/07/2015 0946   HDL 56 08/07/2015 0946   CHOLHDL 3.6 08/07/2015 0946   VLDL 31 (H) 08/07/2015 0946   LDLCALC 113 08/07/2015 0946   LDLDIRECT 215.3 04/05/2013 1242      Wt Readings from Last 3 Encounters:  12/07/15 171 lb 12.8 oz (77.9 kg)  10/08/15 171 lb 12.8 oz (77.9 kg)  09/14/15 172 lb 9.6 oz (78.3 kg)      ASSESSMENT AND PLAN:  # CAD s/p MI and RCA PCI:  Chest pain has resolved. I suspect that this was related to her anxiety and has improved now that this is better controlled.  She had a stress test that was negative for ischemia 05/2015. Continue aspirin, metoprolol, Imdur and atorvastatin.   # Shortness of breath:  # Tachycardia:  Lindsey Middleton has increased shortness of breath and sinus tachycardia. Lower extremity Doppler was negative for DVT and she did not have a pulmonary embolism on CT scan. It is unclear what is causing her tachycardia, though I wonder if anxiety may be contributing. All her laboratory testing thus far has been unremarkable.  Better with the addition of metoprolol.  # Hyperlipidemia:  Lindsey Middleton cholesterol is >100 despite being on atorvastatin 80 mg daily.  We will refer her to lipid clinic for consideration of a PCSK9 inhibitor.  She has known CAD.  Current medicines are reviewed at length with the patient today.  The patient does not have concerns regarding medicines.  The following changes have been made: none  Labs/ tests ordered today include:   No orders of the defined types were placed in this encounter.    Disposition:   FU with Azarion Hove C. Oval Linsey, MD, [facility] in 4 months  Time spent: 45 minutes-Greater than 50% of this time was spent in counseling, explanation  of diagnosis, planning of further management, and coordination of care.    This note was written with the assistance of  speech recognition software.  Please excuse any transcriptional errors.  Signed, Laysa Kimmey C. Oval Linsey, MD, [facility]  12/07/2015 2:25 PM    [facility]

## 2015-12-07 NOTE — Patient Instructions (Signed)
Medication Instructions:  Your physician recommends that you continue on your current medications as directed. Please refer to the Current Medication list given to you today.  Labwork: none  Testing/Procedures: none  Follow-Up: Your physician recommends that you schedule a follow-up appointment in: 4 month ov  Your physician recommends that you schedule a follow-up appointment in: [place]  If you need a refill on your cardiac medications before your next appointment, please call your pharmacy.

## 2015-12-10 ENCOUNTER — Other Ambulatory Visit: Payer: Self-pay

## 2015-12-10 MED ORDER — METOPROLOL SUCCINATE ER 25 MG PO TB24: 25.0000 mg | ORAL_TABLET | Freq: Every day | ORAL | 1 refills | Status: DC

## 2015-12-25 ENCOUNTER — Ambulatory Visit (INDEPENDENT_AMBULATORY_CARE_PROVIDER_SITE_OTHER): Payer: Medicare Other | Admitting: Pharmacist

## 2015-12-25 DIAGNOSIS — E782 Mixed hyperlipidemia: Secondary | ICD-10-CM | POA: Diagnosis not present

## 2015-12-25 DIAGNOSIS — I259 Chronic ischemic heart disease, unspecified: Secondary | ICD-10-CM | POA: Diagnosis not present

## 2015-12-25 MED ORDER — EZETIMIBE 10 MG PO TABS: 10.0000 mg | ORAL_TABLET | Freq: Every day | ORAL | 11 refills | Status: DC

## 2015-12-25 NOTE — Progress Notes (Signed)
Patient ID: Lindsey Middleton                 DOB: [birth year]                    MRN: [id number]     HPI: Lindsey Middleton is a 75 y.o. female patient of Dr. Oval Linsey - last visit 12/07/15. PMH significant CAD s/p DES to mid RCA 2012, HTN, HLD, mild ascending aortic aneurysm, and syncope. Cath in 2013 shows stent was patent with non-obstructive CAD. Lexiscan Myoview 05/2015 for pre-op clearance was low risk for ischemia.   Referred to lipid clinic as a potential PCSK9 inhibitor candidate. Presents with husband and daughter. Pt currently on statin monotherapy (high intensity) which she is tolerating well with most recent LDL at 113 mg/dL. States the only other cholesterol medication she remembers being on was cholestyramine.  Current Medications: Atorvastatin 80mg  Intolerances: None Risk Factors: CAD, HTN, family Hx LDL goal: <70 mg/dL  Diet: High in carbohydrates and dairy. Daughter has been working with pt to improve diet  Exercise: Largely sedentary. Walks on occasion but ankle pain is limiting.  Family History: Cancer - daughter and sister; MI - brother, brother, brother, father, mother, and sister; HLD - daughter  Social History: Never smoker. Denies smokeless tobacco, ETOH, illicit drugs  Labs: Lipid panel 07/2015 (on statin) - TC 200; TG 156; HDL 56; LDL 113  Past Medical History:  Diagnosis Date  . Ascending aortic aneurysm (HCC)    4.2 cm by CT 08/2015.  Repeat in 1 year.  . Asthma   . Coronary artery disease    s/p Rotablator to mid RCA in 2/12  . Dyslipidemia   . Fatigue   . GERD (gastroesophageal reflux disease)   . Heart attack    1986  . Hypertension   . PONV (postoperative nausea and vomiting)   . Rectal polyp    hyplastic  . Varicose vein   . Weakness     Current Outpatient Prescriptions on File Prior to Visit  Medication Sig Dispense Refill  . albuterol (PROVENTIL) (2.5 MG/3ML) 0.083% nebulizer solution Take 2.5 mg by nebulization every 6 (six) hours as needed.  For shortness of breath    . ALPRAZolam (XANAX) 0.25 MG tablet TAKE ONE TABLET BY MOUTH ONCE DAILY 30 tablet 0  . aspirin 325 MG tablet Take 325 mg by mouth daily.      Marland Kitchen atorvastatin (LIPITOR) 80 MG tablet TAKE 1 TABLET EVERY DAY 30 tablet 0  . escitalopram (LEXAPRO) 10 MG tablet Take 1 tablet (10 mg total) by mouth daily. 90 tablet 1  . esomeprazole (NEXIUM) 40 MG capsule Take 40 mg by mouth daily at 12 noon.    . famotidine (PEPCID) 40 MG tablet Take 1 tablet (40 mg total) by mouth at bedtime. 30 tablet 1  . fexofenadine (ALLEGRA) 180 MG tablet Take 180 mg by mouth daily.    . fluticasone (FLONASE) 50 MCG/ACT nasal spray Place 2 sprays into both nostrils 2 (two) times daily. 16 g 2  . Fluticasone-Salmeterol (ADVAIR) 250-50 MCG/DOSE AEPB Inhale 1 puff into the lungs 2 (two) times daily.    . isosorbide mononitrate (IMDUR) 30 MG 24 hr tablet Take 1 tablet (30 mg total) by mouth daily. 90 tablet 3  . lisinopril (PRINIVIL,ZESTRIL) 20 MG tablet Take 1 tablet (20 mg total) by mouth daily. 90 tablet 3  . LORazepam (ATIVAN) 0.5 MG tablet Take by mouth.    . metoprolol succinate (  TOPROL XL) 25 MG 24 hr tablet Take 1 tablet (25 mg total) by mouth daily. 90 tablet 1  . mometasone-formoterol (DULERA) 100-5 MCG/ACT AERO Not taking --- using advair right now d/t dulera cost    . montelukast (SINGULAIR) 10 MG tablet Take 1 tablet (10 mg total) by mouth at bedtime. 30 tablet 0  . nitroGLYCERIN (NITROSTAT) 0.4 MG SL tablet Place 1 tablet (0.4 mg total) under the tongue every 5 (five) minutes as needed. For chest pain 25 tablet 3  . oxymetazoline (AFRIN) 0.05 % nasal spray Place 2 sprays into both nostrils 2 (two) times daily. 30 mL 0  . prednisoLONE acetate (PRED FORTE) 1 % ophthalmic suspension Take 1 drop at 8, 12, 1600, 2000- both eyes    . Vitamin D, Ergocalciferol, (DRISDOL) 50000 UNITS CAPS capsule Take 50,000 Units by mouth every 7 (seven) days. On fridays     No current facility-administered  medications on file prior to visit.     Allergies  Allergen Reactions  . Morphine Anaphylaxis  . Imdur [Isosorbide Dinitrate] Other (See Comments)    hypotension    Assessment/Plan:  Hyperlipidemia: LDL currently above goal of <70 mg/dL on high intensity statin monotherapy. However, last LDL 07/2015 close to goal at 113 mg/dL. Tolerating statin dose without issues. Discussed pros/cons of PCSK9 inhibitor vs additional oral agents with pt. Considering cost and lipid lowering needed, will start ezetimibe 10mg  daily. Pt is agreeable with this plan. Will F/U in 3 months with lipid panel/LFTs. Counseled on diet and exercise as tolerated. Will call pt to schedule follow up appointment in [place], PharmD Clinical Pharmacist 9:44 AM, 12/25/2015

## 2015-12-25 NOTE — Patient Instructions (Signed)
Start taking ezetimibe (Zetia) 10mg  once daily. The Rx has been sent to [facility].  We will call you to schedule a follow up appt once the schedule is open. Please get your labs drawn around a week prior to to this appt. Do not eat at least 12 hours before your labs are drawn.  If you experience any side effects you feel are related to your new medication, please feel free to to call our clinic [phone].

## 2015-12-26 ENCOUNTER — Encounter: Payer: Self-pay | Admitting: Pharmacist

## 2016-01-01 ENCOUNTER — Other Ambulatory Visit: Payer: Self-pay

## 2016-01-01 MED ORDER — EZETIMIBE 10 MG PO TABS: 10.0000 mg | ORAL_TABLET | Freq: Every day | ORAL | 11 refills | Status: DC

## 2016-01-02 ENCOUNTER — Other Ambulatory Visit: Payer: Self-pay | Admitting: *Deleted

## 2016-01-02 MED ORDER — EZETIMIBE 10 MG PO TABS: 10.0000 mg | ORAL_TABLET | Freq: Every day | ORAL | 2 refills | Status: DC

## 2016-03-04 ENCOUNTER — Telehealth: Payer: Self-pay | Admitting: Pharmacist

## 2016-03-04 NOTE — Telephone Encounter (Signed)
Called patient, will have cholesterol labs drawn in [place].

## 2016-03-04 NOTE — Telephone Encounter (Signed)
LMTCB to schedule for lipid follow up after starting Zetia.

## 2016-03-25 ENCOUNTER — Other Ambulatory Visit: Payer: Self-pay | Admitting: Cardiovascular Disease

## 2016-03-25 LAB — LIPID PANEL
CHOL/HDL RATIO: 4.8 ratio (ref <5.0)
CHOLESTEROL: 266 mg/dL — AB (ref <200)
HDL: 55 mg/dL (ref >50)
LDL Cholesterol: 177 mg/dL — ABNORMAL HIGH (ref <100)
TRIGLYCERIDES: 170 mg/dL — AB (ref <150)
VLDL: 34 mg/dL — ABNORMAL HIGH (ref <30)

## 2016-03-25 LAB — HEPATIC FUNCTION PANEL
ALBUMIN: 4.2 g/dL (ref 3.6–5.1)
ALK PHOS: 78 U/L (ref 33–130)
ALT: 9 U/L (ref 6–29)
AST: 14 U/L (ref 10–35)
Bilirubin, Direct: 0.1 mg/dL (ref <=0.2)
Indirect Bilirubin: 0.3 mg/dL (ref 0.2–1.2)
TOTAL PROTEIN: 6.7 g/dL (ref 6.1–8.1)
Total Bilirubin: 0.4 mg/dL (ref 0.2–1.2)

## 2016-03-31 ENCOUNTER — Encounter: Payer: Self-pay | Admitting: *Deleted

## 2016-04-11 ENCOUNTER — Ambulatory Visit (INDEPENDENT_AMBULATORY_CARE_PROVIDER_SITE_OTHER): Payer: Medicare Other | Admitting: Cardiovascular Disease

## 2016-04-11 ENCOUNTER — Encounter: Payer: Self-pay | Admitting: Cardiovascular Disease

## 2016-04-11 VITALS — BP 128/94 | HR 76 | Ht 66.0 in | Wt 176.0 lb

## 2016-04-11 DIAGNOSIS — I7121 Aneurysm of the ascending aorta, without rupture: Secondary | ICD-10-CM

## 2016-04-11 DIAGNOSIS — Z9861 Coronary angioplasty status: Secondary | ICD-10-CM

## 2016-04-11 DIAGNOSIS — Z955 Presence of coronary angioplasty implant and graft: Secondary | ICD-10-CM

## 2016-04-11 DIAGNOSIS — E7800 Pure hypercholesterolemia, unspecified: Secondary | ICD-10-CM

## 2016-04-11 DIAGNOSIS — I712 Thoracic aortic aneurysm, without rupture: Secondary | ICD-10-CM | POA: Diagnosis not present

## 2016-04-11 DIAGNOSIS — I10 Essential (primary) hypertension: Secondary | ICD-10-CM

## 2016-04-11 MED ORDER — CARVEDILOL 12.5 MG PO TABS: 12.5000 mg | ORAL_TABLET | Freq: Two times a day (BID) | ORAL | 3 refills | Status: DC

## 2016-04-11 NOTE — Progress Notes (Signed)
Cardiology Office Note   Date:  04/11/2016   ID:  Lindsey Middleton, DOB May 16, 1940, MRN [id number]  PCP:  No PCP Per Patient  Cardiologist:   Skeet Latch, MD   Chief Complaint  Patient presents with  . Follow-up    4 months     History of Present Illness: Lindsey Middleton is a 76 y.o. female with CAD, hypertension, hyperlipidemia, mild ascending aortic aneurysm, and syncope who presents for follow up.  Lindsey Middleton was previously a patient of [name].  She had an MI in 1986 and in 2012 that was treated with DES to the mid RCA.  She had a cath in 2013 at which time the stent was patent and she had non-obstructive CAD.  She had a [employer] 05/2015 as a pre-op clearance and it was low risk for ischemia.  She last saw [name] 07/2015.  At that appointment her blood pressure was not well-controlled so lisinopril was increased to 20 mg daily.  Her blood pressure was still poorly controlled on 09/14/15.  Imdur was added to her regimen due to chest discomfort and poorly controlled hypertension. She was also noted to be tachycardic and reported increased shortness of breath as well as lower extremity edema. Therefore, she was referred for lower extremity Dopplers that were negative for DVT.  She had a chest CT-A on 09/17/15 that was negative for pulmonary embolism. It showed moderate coronary artery calcifications and a mild ascending aortic aneurysm.  Since her last appointment Lindsey Middleton started on Zetia.  She had lipids checked prior to this appointment and Her LDL was 177 on atorvastatin 80 mg daily. Lindsey Middleton reports that for her brothers had heart attacks in their 33s. She has been feeling well but notes that her blood pressures have been elevated at home. She's had some readings as high as 160/109. She denies any chest pain or shortness of breath. She hasn't noted any lower extremity edema. Lindsey Middleton needs to have pain in her left ankle that prevents her from walking for exercise. She denies  palpitations, lightheadedness, oriented.   Past Medical History:  Diagnosis Date  . Ascending aortic aneurysm (HCC)    4.2 cm by CT 08/2015.  Repeat in 1 year.  . Asthma   . Coronary artery disease    s/p Rotablator to mid RCA in 2/12  . Dyslipidemia   . Fatigue   . GERD (gastroesophageal reflux disease)   . Heart attack    1986  . Hypertension   . PONV (postoperative nausea and vomiting)   . Rectal polyp    hyplastic  . Varicose vein   . Weakness     Past Surgical History:  Procedure Laterality Date  . ABDOMINAL ANGIOGRAM  02/12/2012   Procedure: ABDOMINAL ANGIOGRAM;  Surgeon: Peter M Martinique, MD;  Location: [facility] CATH LAB;  Service: Cardiovascular;;  . ABDOMINAL HYSTERECTOMY  1996   total w/BSO  . CATARACT EXTRACTION     june 2016, july 2016  . CORONARY ANGIOPLASTY  2/12   Rotablator to the RCA  . CORONARY STENT PLACEMENT    . HARDWARE REMOVAL  02/21/2011   Procedure: HARDWARE REMOVAL;  Surgeon: Laurice Record [place];  Location: [place];  Service: Orthopedics;  Laterality: Left;  REMOVAL TWO SCREWS DISTAL LEFT TIBIA   . LEFT HEART CATHETERIZATION WITH CORONARY ANGIOGRAM N/A 02/12/2012   Procedure: LEFT HEART CATHETERIZATION WITH CORONARY ANGIOGRAM;  Surgeon: Peter M Martinique, MD;  Location: [facility] CATH LAB;  Service: Cardiovascular;  Laterality: N/A;  . LEG SURGERY Left    rod, pins, screws   . ORIF TIBIA & FIBULA FRACTURES  2000  . polyp removal    . SPINE SURGERY     2015     Current Outpatient Prescriptions  Medication Sig Dispense Refill  . albuterol (PROVENTIL) (2.5 MG/3ML) 0.083% nebulizer solution Take 2.5 mg by nebulization every 6 (six) hours as needed. For shortness of breath    . ALPRAZolam (XANAX) 0.25 MG tablet TAKE ONE TABLET BY MOUTH ONCE DAILY 30 tablet 0  . aspirin EC 81 MG tablet Take 81 mg by mouth daily.    Marland Kitchen atorvastatin (LIPITOR) 80 MG tablet TAKE 1 TABLET EVERY DAY 30 tablet 0  . escitalopram (LEXAPRO) 10 MG tablet Take 1 tablet (10  mg total) by mouth daily. 90 tablet 1  . esomeprazole (NEXIUM) 40 MG capsule Take 40 mg by mouth daily at 12 noon.    . famotidine (PEPCID) 40 MG tablet Take 1 tablet (40 mg total) by mouth at bedtime. 30 tablet 1  . fexofenadine (ALLEGRA) 180 MG tablet Take 180 mg by mouth daily.    . fluticasone (FLONASE) 50 MCG/ACT nasal spray Place 2 sprays into both nostrils 2 (two) times daily. 16 g 2  . Fluticasone-Salmeterol (ADVAIR) 250-50 MCG/DOSE AEPB Inhale 1 puff into the lungs 2 (two) times daily.    . isosorbide mononitrate (IMDUR) 30 MG 24 hr tablet Take 1 tablet (30 mg total) by mouth daily. 90 tablet 3  . lisinopril (PRINIVIL,ZESTRIL) 20 MG tablet Take 1 tablet (20 mg total) by mouth daily. 90 tablet 3  . LORazepam (ATIVAN) 0.5 MG tablet Take by mouth.    . mometasone-formoterol (DULERA) 100-5 MCG/ACT AERO Not taking --- using advair right now d/t dulera cost    . montelukast (SINGULAIR) 10 MG tablet Take 1 tablet (10 mg total) by mouth at bedtime. 30 tablet 0  . nitroGLYCERIN (NITROSTAT) 0.4 MG SL tablet Place 1 tablet (0.4 mg total) under the tongue every 5 (five) minutes as needed. For chest pain 25 tablet 3  . oxymetazoline (AFRIN) 0.05 % nasal spray Place 2 sprays into both nostrils 2 (two) times daily. 30 mL 0  . prednisoLONE acetate (PRED FORTE) 1 % ophthalmic suspension Take 1 drop at 8, 12, 1600, 2000- both eyes    . Vitamin D, Ergocalciferol, (DRISDOL) 50000 UNITS CAPS capsule Take 50,000 Units by mouth every 7 (seven) days. On fridays    . carvedilol (COREG) 12.5 MG tablet Take 1 tablet (12.5 mg total) by mouth 2 (two) times daily. 180 tablet 3  . ezetimibe (ZETIA) 10 MG tablet Take 1 tablet (10 mg total) by mouth daily. 30 tablet 2   No current facility-administered medications for this visit.     Allergies:   Morphine and Imdur [isosorbide dinitrate]    Social History:  The patient  reports that she has never smoked. She has never used smokeless tobacco. She reports that she  does not drink alcohol or use drugs.   Family History:  The patient's family history includes Cancer in her daughter and sister; Heart attack in her brother, brother, brother, father, mother, and sister; Heart disease in her brother, daughter, father, mother, and son; Hyperlipidemia in her daughter; Varicose Veins in her daughter.    ROS:  Please see the history of present illness.   Otherwise, review of systems are positive for none.   All other systems are reviewed and negative.  PHYSICAL EXAM: VS:  BP (!) 128/94   Pulse 76   Ht 5\' 6"  (1.676 m)   Wt 79.8 kg (176 lb)   BMI 28.41 kg/m  , BMI Body mass index is 28.41 kg/m. GENERAL:  Slightly anxious  Mild respiratory distress when leaning back. HEENT:  Pupils equal round and reactive, fundi not visualized, oral mucosa unremarkable NECK:  No jugular venous distention, waveform within normal limits, carotid upstroke brisk and symmetric, no bruits LYMPHATICS:  No cervical adenopathy LUNGS:  Clear to auscultation bilaterally HEART:  RRR.  PMI not displaced or sustained,S1 and S2 within normal limits, no S3, no S4, no clicks, no rubs, no murmurs ABD:  Flat, positive bowel sounds normal in frequency in pitch, no bruits, no rebound, no guarding, no midline pulsatile mass, no hepatomegaly, no splenomegaly EXT:  2 plus pulses throughout, no edema, no cyanosis no clubbing SKIN:  No rashes no nodules NEURO:  Cranial nerves II through XII grossly intact, motor grossly intact throughout PSYCH:  Cognitively intact, oriented to person place and time   EKG:  EKG is ordered today. The ekg ordered today demonstrates sinus bradycardia rate 76 bpm.  LAFB.  Absent R wave progression.   Lexiscan Myoview 05/28/15:  Nuclear stress EF: 60%.  No T wave inversion was noted during stress.  There was no ST segment deviation noted during stress.  Defect 1: There is a small defect of mild severity.  This is a low risk study.  Small, mild fixed  inferolateral defect, likely artifact. No reversible ischemia. LVEF 60% with normal wall motion. This is a low risk study.  Echo 02/06/12: LVEF 55-60%.  Mild mitral regurgitation.    Recent Labs: 08/07/2015: BUN 24; Creat 0.57; Potassium 5.0; Sodium 140 03/25/2016: ALT 9    Lipid Panel    Component Value Date/Time   CHOL 266 (H) 03/25/2016 1419   TRIG 170 (H) 03/25/2016 1419   HDL 55 03/25/2016 1419   CHOLHDL 4.8 03/25/2016 1419   VLDL 34 (H) 03/25/2016 1419   LDLCALC 177 (H) 03/25/2016 1419   LDLDIRECT 215.3 04/05/2013 1242      Wt Readings from Last 3 Encounters:  04/11/16 79.8 kg (176 lb)  12/07/15 77.9 kg (171 lb 12.8 oz)  10/08/15 77.9 kg (171 lb 12.8 oz)      ASSESSMENT AND PLAN:  # CAD s/p MI and RCA PCI:  Chest pain has resolved. She had a stress test that was negative for ischemia 05/2015. Continue aspirin, Imdur and atorvastatin.   Switch metoprolol to carvedilol.   # Shortness of breath:  # Tachycardia:  Tachycardia improved with metoprolol and Lexapro.  I suspect that it was attributable to anxiety as is now better controlled.  Lower extremity Doppler was negative for DVT and she did not have a pulmonary embolism on CT scan.   # Hyperlipidemia:  Lindsey Middleton cholesterol is >100 despite being on atorvastatin 80 mg daily.  She started on Zetia and is scheduled to have lipids repeated April 2018.  If they remain elevated she will need to be on a PCSK9 inhibitor.  # Hypertension: Blood pressure is poorly-controlled here and at home. On repeat her blood pressure was 150/98. We will stop metoprolol and start carvedilol 12.5 mg twice daily. Continue lisinopril 10 mg daily.   # Ascending aorta aneurysm: 4.2 cm on CT 08/2015.  Will check on echo at follow up.  BP control as above.   Current medicines are reviewed at length with the patient today.  The patient does not have concerns regarding medicines.  The following changes have been made: none  Labs/ tests ordered  today include:   Orders Placed This Encounter  Procedures  . Lipid panel  . Comprehensive metabolic panel  . EKG 12-Lead     Disposition:   FU with Lindsey Haug C. Oval Linsey, MD, [facility] in 6 months.  Pharmacy in 1 month for BP management.    This note was written with the assistance of speech recognition software.  Please excuse any transcriptional errors.  Signed, Laiklynn Raczynski C. Oval Linsey, MD, [facility]  04/11/2016 11:32 AM    [facility]

## 2016-04-11 NOTE — Patient Instructions (Addendum)
Medication Instructions:  STOP METOPROLOL  START CARVEDILOL 12.5 MG TWICE A DAY  Labwork: FASTING LP/CMET AT YOUR PRIMARY CARE IN April   Testing/Procedures: NONE  Follow-Up: Your physician recommends that you schedule a follow-up appointment in: 3-4 WEEKS WITH PHARM D FOR BLOOD PRESSURE   Your physician wants you to follow-up in: [place] will receive a reminder letter in the mail two months in advance. If you don't receive a letter, please call our office to schedule the follow-up appointment.  If you need a refill on your cardiac medications before your next appointment, please call your pharmacy.

## 2016-04-29 ENCOUNTER — Ambulatory Visit: Payer: Medicare Other

## 2016-05-09 ENCOUNTER — Ambulatory Visit (INDEPENDENT_AMBULATORY_CARE_PROVIDER_SITE_OTHER): Payer: Medicare Other | Admitting: Pharmacist

## 2016-05-09 VITALS — BP 140/92 | HR 72

## 2016-05-09 DIAGNOSIS — I259 Chronic ischemic heart disease, unspecified: Secondary | ICD-10-CM | POA: Diagnosis not present

## 2016-05-09 DIAGNOSIS — I10 Essential (primary) hypertension: Secondary | ICD-10-CM

## 2016-05-09 MED ORDER — LISINOPRIL 30 MG PO TABS: 30.0000 mg | ORAL_TABLET | Freq: Every day | ORAL | 5 refills | Status: DC

## 2016-05-09 NOTE — Progress Notes (Signed)
Patient ID: Lindsey Middleton                 DOB: 1940/05/11                      MRN: [id number]     HPI:  Lindsey Middleton is a 76 y.o. female referred by Dr. Oval Middleton to HTN clinic. PMH includes CAD s/p MI and RCA PCI, tachycardia, hyperlipidemia, hypertension, AAA and syncope.  During office visit with Dr Lindsey Middleton on 04/11/16 it was noted her BP was poorly control and metoprolol therapy was changed to carvedilol 12.5mg  twice daily.   Patient presents today for HTN follow up. Denies dizziness, increase fatigue, swelling or headaches.  She stated her blood pressure at home remains above 130-140s regularly and she is under a lots of stress due to her husband dementia.  Current HTN meds:  Lisinopril 20mg  daily Carvedilol 12.5mg  twice daily Isosorbide mononitrate 30mg  daily  Previously tried:  Metoprolol 25mg  daily  BP goal: 130/80  Family History: The patient's family history includes Cancer in her daughter and sister; Heart attack in her brother, brother, brother, father, mother, and sister; Heart disease in her brother, daughter, father, mother, and son; Hyperlipidemia in her daughter; Varicose Veins in her daughter.   Social History: The patient  reports that she has never smoked. She has never used smokeless tobacco. She reports that she does not drink alcohol or use drugs.   Diet: High in carbohydrates and dairy. Daughter has been working with pt to improve diet  Exercise:Largely sedentary. Walks on occasion but ankle pain is limiting.  leg pain??  Home BP readings: none avalable  Wt Readings from Last 3 Encounters:  04/11/16 176 lb (79.8 kg)  12/07/15 171 lb 12.8 oz (77.9 kg)  10/08/15 171 lb 12.8 oz (77.9 kg)   BP Readings from Last 3 Encounters:  05/09/16 (!) 140/92  04/11/16 (!) 128/94  12/07/15 137/77   Pulse Readings from Last 3 Encounters:  05/09/16 72  04/11/16 76  12/07/15 81    Past Medical History:  Diagnosis Date  . Ascending aortic aneurysm (HCC)    4.2 cm by  CT 08/2015.  Repeat in 1 year.  . Asthma   . Coronary artery disease    s/p Rotablator to mid RCA in 2/12  . Dyslipidemia   . Fatigue   . GERD (gastroesophageal reflux disease)   . Heart attack    1986  . Hypertension   . PONV (postoperative nausea and vomiting)   . Rectal polyp    hyplastic  . Varicose vein   . Weakness     Current Outpatient Prescriptions on File Prior to Visit  Medication Sig Dispense Refill  . albuterol (PROVENTIL) (2.5 MG/3ML) 0.083% nebulizer solution Take 2.5 mg by nebulization every 6 (six) hours as needed. For shortness of breath    . ALPRAZolam (XANAX) 0.25 MG tablet TAKE ONE TABLET BY MOUTH ONCE DAILY 30 tablet 0  . aspirin EC 81 MG tablet Take 81 mg by mouth daily.    Marland Kitchen atorvastatin (LIPITOR) 80 MG tablet TAKE 1 TABLET EVERY DAY 30 tablet 0  . carvedilol (COREG) 12.5 MG tablet Take 1 tablet (12.5 mg total) by mouth 2 (two) times daily. 180 tablet 3  . escitalopram (LEXAPRO) 10 MG tablet Take 1 tablet (10 mg total) by mouth daily. 90 tablet 1  . esomeprazole (NEXIUM) 40 MG capsule Take 40 mg by mouth daily at 12 noon.    Marland Kitchen  ezetimibe (ZETIA) 10 MG tablet Take 1 tablet (10 mg total) by mouth daily. 30 tablet 2  . famotidine (PEPCID) 40 MG tablet Take 1 tablet (40 mg total) by mouth at bedtime. 30 tablet 1  . fexofenadine (ALLEGRA) 180 MG tablet Take 180 mg by mouth daily.    . fluticasone (FLONASE) 50 MCG/ACT nasal spray Place 2 sprays into both nostrils 2 (two) times daily. 16 g 2  . Fluticasone-Salmeterol (ADVAIR) 250-50 MCG/DOSE AEPB Inhale 1 puff into the lungs 2 (two) times daily.    . isosorbide mononitrate (IMDUR) 30 MG 24 hr tablet Take 1 tablet (30 mg total) by mouth daily. 90 tablet 3  . LORazepam (ATIVAN) 0.5 MG tablet Take by mouth.    . mometasone-formoterol (DULERA) 100-5 MCG/ACT AERO Not taking --- using advair right now d/t dulera cost    . montelukast (SINGULAIR) 10 MG tablet Take 1 tablet (10 mg total) by mouth at bedtime. 30 tablet 0  .  nitroGLYCERIN (NITROSTAT) 0.4 MG SL tablet Place 1 tablet (0.4 mg total) under the tongue every 5 (five) minutes as needed. For chest pain 25 tablet 3  . oxymetazoline (AFRIN) 0.05 % nasal spray Place 2 sprays into both nostrils 2 (two) times daily. 30 mL 0  . prednisoLONE acetate (PRED FORTE) 1 % ophthalmic suspension Take 1 drop at 8, 12, 1600, 2000- both eyes    . Vitamin D, Ergocalciferol, (DRISDOL) 50000 UNITS CAPS capsule Take 50,000 Units by mouth every 7 (seven) days. On fridays     No current facility-administered medications on file prior to visit.     Allergies  Allergen Reactions  . Morphine Anaphylaxis  . Imdur [Isosorbide Dinitrate] Other (See Comments)    hypotension    Blood pressure (!) 140/92, pulse 72.  Essential hypertension:  Blood pressure today remains above goal of 130/80 and correlates with home readings reported by patient.  Patient tolerating current therapy without problems.  Will increase Lisinopril from 20mg  to 30mg  daily and repeat BMET in 2 weeks with clinic follow up in 4 weeks. Patient instructed to bring home monitor and records of BP home readings to next office visit.  Lindsey Middleton PharmD, Lindsey Middleton [id number] 05/11/2016 9:20 AM

## 2016-05-09 NOTE — Patient Instructions (Addendum)
Return for a  follow up appointment in 4 weeks  Your blood pressure today is 140/92 pulse 72  Check your blood pressure at home daily (if able) and keep record of the readings.  Take your BP meds as follows: INCREASE  Lisinopril to 30mg  daily Carvedilol 12.5mg  twice daily Isosorbide mononitrate 30mg  daily  Bring all of your meds, your BP cuff and your record of home blood pressures to your next appointment.  Exercise as you're able, try to walk approximately 30 minutes per day.  Keep salt intake to a minimum, especially watch canned and prepared boxed foods.  Eat more fresh fruits and vegetables and fewer canned items.  Avoid eating in fast food restaurants.    HOW TO TAKE YOUR BLOOD PRESSURE: . Rest 5 minutes before taking your blood pressure. .  Don't smoke or drink caffeinated beverages for at least 30 minutes before. . Take your blood pressure before (not after) you eat. . Sit comfortably with your back supported and both feet on the floor (don't cross your legs). . Elevate your arm to heart level on a table or a desk. . Use the proper sized cuff. It should fit smoothly and snugly around your bare upper arm. There should be enough room to slip a fingertip under the cuff. The bottom edge of the cuff should be 1 inch above the crease of the elbow. . Ideally, take 3 measurements at one sitting and record the average.

## 2016-05-12 ENCOUNTER — Ambulatory Visit: Payer: Medicare Other

## 2016-05-30 ENCOUNTER — Other Ambulatory Visit: Payer: Self-pay | Admitting: Cardiovascular Disease

## 2016-05-30 LAB — COMPREHENSIVE METABOLIC PANEL
ALBUMIN: 4.3 g/dL (ref 3.6–5.1)
ALT: 12 U/L (ref 6–29)
AST: 15 U/L (ref 10–35)
Alkaline Phosphatase: 72 U/L (ref 33–130)
BUN: 17 mg/dL (ref 7–25)
CHLORIDE: 105 mmol/L (ref 98–110)
CO2: 27 mmol/L (ref 20–31)
Calcium: 9.7 mg/dL (ref 8.6–10.4)
Creat: 0.67 mg/dL (ref 0.60–0.93)
Glucose, Bld: 99 mg/dL (ref 65–99)
POTASSIUM: 4.7 mmol/L (ref 3.5–5.3)
SODIUM: 141 mmol/L (ref 135–146)
Total Bilirubin: 0.6 mg/dL (ref 0.2–1.2)
Total Protein: 6.7 g/dL (ref 6.1–8.1)

## 2016-05-30 LAB — LIPID PANEL
CHOL/HDL RATIO: 6.2 ratio — AB (ref <5.0)
Cholesterol: 335 mg/dL — ABNORMAL HIGH (ref <200)
HDL: 54 mg/dL (ref >50)
LDL CALC: 254 mg/dL — AB (ref <100)
TRIGLYCERIDES: 135 mg/dL (ref <150)
VLDL: 27 mg/dL (ref <30)

## 2016-06-09 ENCOUNTER — Other Ambulatory Visit: Payer: Self-pay | Admitting: Cardiovascular Disease

## 2016-06-09 NOTE — Telephone Encounter (Signed)
Will you please review for refill, Thanks! 

## 2016-06-09 NOTE — Telephone Encounter (Signed)
Confirmed with patient she is taking Carvedilol not Metoprolol

## 2016-06-12 ENCOUNTER — Telehealth: Payer: Self-pay | Admitting: Cardiovascular Disease

## 2016-06-12 NOTE — Telephone Encounter (Signed)
Left message to call back  

## 2016-06-12 NOTE — Telephone Encounter (Signed)
Daughter wanted you to know pt is having surgery today,will not be in for her blood pressure check on Tuesday. She also wants you to know that patient's blood pressure is still elevated.

## 2016-06-13 NOTE — Telephone Encounter (Signed)
Left message to call back  

## 2016-06-13 NOTE — Telephone Encounter (Signed)
Returning your call from yesterday. °

## 2016-06-13 NOTE — Telephone Encounter (Signed)
Spoke with daughter who accompanies patient to appointment. She is not sure if her mother is taking all of her medications as directed. Advised she needed to go over her medications with her mother via last AVS and check her pills to make sure everything listed she is taking unless d/c by another physician. Per daughter patient is becoming more forgetful, advised to follow up with PCP regarding memory.

## 2016-06-17 ENCOUNTER — Ambulatory Visit: Payer: Medicare Other

## 2016-06-27 ENCOUNTER — Telehealth: Payer: Self-pay | Admitting: *Deleted

## 2016-06-27 DIAGNOSIS — Z01818 Encounter for other preprocedural examination: Secondary | ICD-10-CM

## 2016-06-27 DIAGNOSIS — E785 Hyperlipidemia, unspecified: Secondary | ICD-10-CM

## 2016-06-27 MED ORDER — ISOSORBIDE MONONITRATE ER 30 MG PO TB24: 30.0000 mg | ORAL_TABLET | Freq: Every day | ORAL | 1 refills | Status: DC

## 2016-06-27 NOTE — Telephone Encounter (Signed)
Lets have her take both and repeat lipids and CMP in 6 weeks.

## 2016-06-27 NOTE — Telephone Encounter (Signed)
Spoke with pt, she has not been taking atorvastatin with the zetia. She is going to restart the lipitor. Will make dr Oval Linsey aware, ? If need to repeat labs on both drugs.

## 2016-06-27 NOTE — Telephone Encounter (Signed)
-----   Message from Skeet Latch, MD sent at 06/15/2016  9:19 PM EDT ----- Normal kidney function, liver function and electrolytes. Cholesterol levels are much higher than when last checked.  Is she taking atorvastatin? Please refer to lipid clinic to add PCSK9 inhibitor.

## 2016-06-30 NOTE — Telephone Encounter (Signed)
Advised patient, verbalized understanding  Per patient she did stop the Imdur secondary to blood pressure dropping. She is feeling better since stopping

## 2016-08-22 ENCOUNTER — Telehealth: Payer: Self-pay | Admitting: Cardiovascular Disease

## 2016-08-22 NOTE — Telephone Encounter (Signed)
New message    Pt is calling for results of blood work. Please call.

## 2016-08-22 NOTE — Telephone Encounter (Signed)
Labs scanned in Epic, will forward to Dr Oval Linsey so she can review.

## 2016-08-28 NOTE — Telephone Encounter (Signed)
Kidney function, liver function and electrolytes are normal.  Cholesterol remains very elevated.  Refer to lipid clinic for PCSK9 inhibitor.

## 2016-08-29 NOTE — Telephone Encounter (Signed)
Scheduled appointment for Pharm D but she preferred to wait until end of August

## 2016-10-15 ENCOUNTER — Ambulatory Visit: Payer: Medicare Other

## 2016-12-30 ENCOUNTER — Telehealth: Payer: Self-pay | Admitting: *Deleted

## 2016-12-30 ENCOUNTER — Telehealth: Payer: Self-pay | Admitting: Cardiovascular Disease

## 2016-12-30 NOTE — Telephone Encounter (Signed)
Spoke with Daughter Allene Dillon she states that 12-25-16 pt had a an outpatient ear tube placement. And had a very bad reaction to some medication lips/tongue were swollen and pt was unconscious, vomiting, she was transferred to [facility] ER then admitted to [facility]. They thought that pt had a stroke but when they did MRI, EEG came back negative for stroke. They told her that pt just had a bad reaction to "whatever medication that ENT gave her"  Lido w/EPI and phenergan is all that she could get "out of ENT" that she took. I will notify medical records to try to get these records. Pt has appt scheduled with Dr Oval Linsey 11-15 @ 10am. Please call pt back on work number [phone].

## 2016-12-30 NOTE — Telephone Encounter (Signed)
Medical records received from [facility] for patients upcoming appointment on 01/08/17 with Dr. Oval Linsey. Records given to [facility].

## 2016-12-30 NOTE — Telephone Encounter (Signed)
New message    Pt daughter is calling asking for a call back from [place]. She did not say what the call was about.

## 2016-12-31 NOTE — Telephone Encounter (Signed)
OK I'm happy to see her for her regularly scheduled appointment, but these issues sound like they need primary care follow up.

## 2016-12-31 NOTE — Telephone Encounter (Signed)
Advised daughter 

## 2017-01-01 ENCOUNTER — Ambulatory Visit: Payer: Medicare Other | Admitting: Cardiovascular Disease

## 2017-01-08 ENCOUNTER — Ambulatory Visit: Payer: Medicare Other | Admitting: Cardiovascular Disease

## 2017-01-08 NOTE — Progress Notes (Deleted)
Cardiology Office Note   Date:  01/08/2017   ID:  Lindsey Middleton, DOB [birth year], MRN [id number]  PCP:  Patient, No Pcp Per  Cardiologist:   Skeet Latch, MD   No chief complaint on file.    History of Present Illness: Lindsey Middleton is a 76 y.o. female with CAD, hypertension, hyperlipidemia, mild ascending aortic aneurysm, and syncope who presents for follow up.  Lindsey Middleton was previously a patient of [name].  She had an MI in 1986 and in 2012 that was treated with DES to the mid RCA.  She had a cath in 2013 at which time the stent was patent and she had non-obstructive CAD.  She had a [employer] 05/2015 as a pre-op clearance and it was low risk for ischemia.  She last saw [name] 07/2015.  At that appointment her blood pressure was not well-controlled so lisinopril was increased to 20 mg daily.  Her blood pressure was still poorly controlled on 09/14/15.  Imdur was added to her regimen due to chest discomfort and poorly controlled hypertension. She was also noted to be tachycardic and reported increased shortness of breath as well as lower extremity edema. Therefore, she was referred for lower extremity Dopplers that were negative for DVT.  She had a chest CT-A on 09/17/15 that was negative for pulmonary embolism. It showed moderate coronary artery calcifications and a mild ascending aortic aneurysm.  Her lipids have been poorly-controlled.  Her LDL has been over 170 on atorvastatin 80 mg daily so Zetia was added to her regimen.  Her lipids remained elevated so she was referred to the lipid clinic for consideration of a PCSK9 inhibitor.   Lipid clinic  Past Medical History:  Diagnosis Date  . Ascending aortic aneurysm (HCC)    4.2 cm by CT 08/2015.  Repeat in 1 year.  . Asthma   . Coronary artery disease    s/p Rotablator to mid RCA in 2/12  . Dyslipidemia   . Fatigue   . GERD (gastroesophageal reflux disease)   . Heart attack    1986  . Hypertension   . PONV  (postoperative nausea and vomiting)   . Rectal polyp    hyplastic  . Varicose vein   . Weakness     Past Surgical History:  Procedure Laterality Date  . ABDOMINAL ANGIOGRAM  02/12/2012   Procedure: ABDOMINAL ANGIOGRAM;  Surgeon: Peter M Martinique, MD;  Location: [facility] CATH LAB;  Service: Cardiovascular;;  . ABDOMINAL HYSTERECTOMY  1996   total w/BSO  . CATARACT EXTRACTION     june 2016, july 2016  . CORONARY ANGIOPLASTY  2/12   Rotablator to the RCA  . CORONARY STENT PLACEMENT    . HARDWARE REMOVAL  02/21/2011   Procedure: HARDWARE REMOVAL;  Surgeon: Laurice Record Aplington;  Location: [place];  Service: Orthopedics;  Laterality: Left;  REMOVAL TWO SCREWS DISTAL LEFT TIBIA   . LEFT HEART CATHETERIZATION WITH CORONARY ANGIOGRAM N/A 02/12/2012   Procedure: LEFT HEART CATHETERIZATION WITH CORONARY ANGIOGRAM;  Surgeon: Peter M Martinique, MD;  Location: [facility] CATH LAB;  Service: Cardiovascular;  Laterality: N/A;  . LEG SURGERY Left    rod, pins, screws   . ORIF TIBIA & FIBULA FRACTURES  2000  . polyp removal    . SPINE SURGERY     2015     Current Outpatient Medications  Medication Sig Dispense Refill  . albuterol (PROVENTIL) (2.5 MG/3ML) 0.083% nebulizer solution Take 2.5 mg  by nebulization every 6 (six) hours as needed. For shortness of breath    . ALPRAZolam (XANAX) 0.25 MG tablet TAKE ONE TABLET BY MOUTH ONCE DAILY 30 tablet 0  . aspirin EC 81 MG tablet Take 81 mg by mouth daily.    Marland Kitchen atorvastatin (LIPITOR) 80 MG tablet TAKE 1 TABLET EVERY DAY 30 tablet 0  . carvedilol (COREG) 12.5 MG tablet Take 1 tablet (12.5 mg total) by mouth 2 (two) times daily. 180 tablet 3  . escitalopram (LEXAPRO) 10 MG tablet Take 1 tablet (10 mg total) by mouth daily. 90 tablet 1  . esomeprazole (NEXIUM) 40 MG capsule Take 40 mg by mouth daily at 12 noon.    . ezetimibe (ZETIA) 10 MG tablet Take 1 tablet (10 mg total) by mouth daily. 30 tablet 2  . famotidine (PEPCID) 40 MG tablet Take 1 tablet (40  mg total) by mouth at bedtime. 30 tablet 1  . fexofenadine (ALLEGRA) 180 MG tablet Take 180 mg by mouth daily.    . fluticasone (FLONASE) 50 MCG/ACT nasal spray Place 2 sprays into both nostrils 2 (two) times daily. 16 g 2  . Fluticasone-Salmeterol (ADVAIR) 250-50 MCG/DOSE AEPB Inhale 1 puff into the lungs 2 (two) times daily.    Marland Kitchen lisinopril (PRINIVIL,ZESTRIL) 30 MG tablet Take 1 tablet (30 mg total) by mouth daily. 30 tablet 5  . LORazepam (ATIVAN) 0.5 MG tablet Take by mouth.    . mometasone-formoterol (DULERA) 100-5 MCG/ACT AERO Not taking --- using advair right now d/t dulera cost    . montelukast (SINGULAIR) 10 MG tablet Take 1 tablet (10 mg total) by mouth at bedtime. 30 tablet 0  . nitroGLYCERIN (NITROSTAT) 0.4 MG SL tablet Place 1 tablet (0.4 mg total) under the tongue every 5 (five) minutes as needed. For chest pain 25 tablet 3  . oxymetazoline (AFRIN) 0.05 % nasal spray Place 2 sprays into both nostrils 2 (two) times daily. 30 mL 0  . prednisoLONE acetate (PRED FORTE) 1 % ophthalmic suspension Take 1 drop at 8, 12, 1600, 2000- both eyes    . Vitamin D, Ergocalciferol, (DRISDOL) 50000 UNITS CAPS capsule Take 50,000 Units by mouth every 7 (seven) days. On fridays     No current facility-administered medications for this visit.     Allergies:   Morphine and Imdur [isosorbide dinitrate]    Social History:  The patient  reports that  has never smoked. she has never used smokeless tobacco. She reports that she does not drink alcohol or use drugs.   Family History:  The patient's family history includes Cancer in her daughter and sister; Heart attack in her brother, brother, brother, father, mother, and sister; Heart disease in her brother, daughter, father, mother, and son; Hyperlipidemia in her daughter; Varicose Veins in her daughter.    ROS:  Please see the history of present illness.   Otherwise, review of systems are positive for none.   All other systems are reviewed and negative.      PHYSICAL EXAM: VS:  There were no vitals taken for this visit. , BMI There is no height or weight on file to calculate BMI. GENERAL:  Slightly anxious  Mild respiratory distress when leaning back. HEENT:  Pupils equal round and reactive, fundi not visualized, oral mucosa unremarkable NECK:  No jugular venous distention, waveform within normal limits, carotid upstroke brisk and symmetric, no bruits LYMPHATICS:  No cervical adenopathy LUNGS:  Clear to auscultation bilaterally HEART:  RRR.  PMI not displaced or  sustained,S1 and S2 within normal limits, no S3, no S4, no clicks, no rubs, no murmurs ABD:  Flat, positive bowel sounds normal in frequency in pitch, no bruits, no rebound, no guarding, no midline pulsatile mass, no hepatomegaly, no splenomegaly EXT:  2 plus pulses throughout, no edema, no cyanosis no clubbing SKIN:  No rashes no nodules NEURO:  Cranial nerves II through XII grossly intact, motor grossly intact throughout PSYCH:  Cognitively intact, oriented to person place and time   EKG:  EKG is ordered today. The ekg ordered today demonstrates sinus bradycardia rate 76 bpm.  LAFB.  Absent R wave progression.   Lexiscan Myoview 05/28/15:  Nuclear stress EF: 60%.  No T wave inversion was noted during stress.  There was no ST segment deviation noted during stress.  Defect 1: There is a small defect of mild severity.  This is a low risk study.  Small, mild fixed inferolateral defect, likely artifact. No reversible ischemia. LVEF 60% with normal wall motion. This is a low risk study.  Echo 02/06/12: LVEF 55-60%.  Mild mitral regurgitation.    Recent Labs: 05/30/2016: ALT 12; BUN 17; Creat 0.67; Potassium 4.7; Sodium 141    Lipid Panel    Component Value Date/Time   CHOL 335 (H) 05/30/2016 0900   TRIG 135 05/30/2016 0900   HDL 54 05/30/2016 0900   CHOLHDL 6.2 (H) 05/30/2016 0900   VLDL 27 05/30/2016 0900   LDLCALC 254 (H) 05/30/2016 0900   LDLDIRECT 215.3  04/05/2013 1242   08/06/16: Sodium 141, potassium 4.7, BUN 16, creatinine 0.61 Total cholesterol 282, triglycerides 143, HDL 54, LDL 199 AST 17, ALT 24   Wt Readings from Last 3 Encounters:  04/11/16 79.8 kg (176 lb)  12/07/15 77.9 kg (171 lb 12.8 oz)  10/08/15 77.9 kg (171 lb 12.8 oz)      ASSESSMENT AND PLAN:  # CAD s/p MI and RCA PCI:  Chest pain has resolved. She had a stress test that was negative for ischemia 05/2015. Continue aspirin, Imdur and atorvastatin.   Switch metoprolol to carvedilol.   # Shortness of breath:  # Tachycardia:  Tachycardia improved with metoprolol and Lexapro.  I suspect that it was attributable to anxiety as is now better controlled.  Lower extremity Doppler was negative for DVT and she did not have a pulmonary embolism on CT scan.   # Hyperlipidemia:  Lindsey Middleton cholesterol is >100 despite being on atorvastatin 80 mg daily.  She started on Zetia and is scheduled to have lipids repeated April 2018.  If they remain elevated she will need to be on a PCSK9 inhibitor.  # Hypertension: Blood pressure is poorly-controlled here and at home. On repeat her blood pressure was 150/98. We will stop metoprolol and start carvedilol 12.5 mg twice daily. Continue lisinopril 10 mg daily.   # Ascending aorta aneurysm: 4.2 cm on CT 08/2015.  Will check on echo at follow up.  BP control as above.   Current medicines are reviewed at length with the patient today.  The patient does not have concerns regarding medicines.  The following changes have been made: none  Labs/ tests ordered today include:   No orders of the defined types were placed in this encounter.    Disposition:   FU with Lindsey Soulier C. Oval Linsey, MD, [facility] in 6 months.  Pharmacy in 1 month for BP management.    This note was written with the assistance of speech recognition software.  Please excuse any transcriptional errors.  Signed, Myan Suit C. Oval Linsey, MD, [facility]  01/08/2017 5:58 AM    [facility]

## 2017-01-09 ENCOUNTER — Other Ambulatory Visit: Payer: Self-pay

## 2017-01-09 ENCOUNTER — Ambulatory Visit (INDEPENDENT_AMBULATORY_CARE_PROVIDER_SITE_OTHER): Payer: Medicare Other | Admitting: Cardiovascular Disease

## 2017-01-09 VITALS — BP 125/78 | HR 74

## 2017-01-09 DIAGNOSIS — I259 Chronic ischemic heart disease, unspecified: Secondary | ICD-10-CM

## 2017-01-09 DIAGNOSIS — I10 Essential (primary) hypertension: Secondary | ICD-10-CM | POA: Diagnosis not present

## 2017-01-09 DIAGNOSIS — E785 Hyperlipidemia, unspecified: Secondary | ICD-10-CM | POA: Diagnosis not present

## 2017-01-09 DIAGNOSIS — Z9861 Coronary angioplasty status: Secondary | ICD-10-CM | POA: Diagnosis not present

## 2017-01-09 DIAGNOSIS — Z955 Presence of coronary angioplasty implant and graft: Secondary | ICD-10-CM

## 2017-01-09 MED ORDER — FUROSEMIDE 20 MG PO TABS: ORAL_TABLET | ORAL | 3 refills | Status: DC

## 2017-01-09 MED ORDER — EZETIMIBE 10 MG PO TABS: 10.0000 mg | ORAL_TABLET | Freq: Every day | ORAL | 3 refills | Status: DC

## 2017-01-09 NOTE — Progress Notes (Signed)
Cardiology Office Note   Date:  01/11/2017   ID:  Lindsey Middleton, DOB [birth year], MRN [id number]  PCP:  Patient, No Pcp Per  Cardiologist:   Skeet Latch, MD   Chief Complaint  Patient presents with  . Follow-up     History of Present Illness: Lindsey Middleton is a 76 y.o. female with CAD, hypertension, hyperlipidemia, mild ascending aortic aneurysm, and syncope who presents for follow up.  Lindsey Middleton was previously a patient of [name].  She had an MI in 1986 and in 2012 that was treated with DES to the mid RCA.  She had a cath in 2013 at which time the stent was patent and she had non-obstructive CAD.  She had a [employer] 05/2015 as a pre-op clearance and it was low risk for ischemia.  She last saw Lindsey Middleton 07/2015.  At that appointment her blood pressure was not well-controlled so lisinopril was increased to 20 mg daily.  Her blood pressure was still poorly controlled on 09/14/15.  Imdur was added to her regimen due to chest discomfort and poorly controlled hypertension. She was also noted to be tachycardic and reported increased shortness of breath as well as lower extremity edema. Therefore, she was referred for lower extremity Dopplers that were negative for DVT.  She had a chest CT-A on 09/17/15 that was negative for pulmonary embolism. It showed moderate coronary artery calcifications and a mild ascending aortic aneurysm.  Her lipids have been poorly-controlled.  Her LDL has been over 170 on atorvastatin 80 mg daily so Zetia was added to her regimen.  Her lipids remained elevated so she was referred to the lipid clinic for consideration of a PCSK9 inhibitor.  Since her last appointment Lindsey Middleton underwent bilateral myringotomy tube placement.  After the procedure she had a lot of vomiting and was given Phenergan.  While in the office she became very lethargic.  She also had bilious emesis and hematemesis.  By the time she was brought to the ED her mental status improved.   She was later noted to have transient dysarthria thought to be 2/2 angioedema. Cardiac enzymes were negative and EKG was unremarkable.  She reports that she had not been taking lisinopril since her last cardiology office.  When carvedilol was started she thought this was supposed to be instead of her lisinopril rather than in addition to it.  She had an EEG that demonstrated moderate, diffuse slowing that was felt to be consistent with either moderate cerebral dysfunction or medication effect.  She was treated with Benadryl, steroids, and ranitidine with improvement in her symptoms.  MRI was negative for stroke.  Since leaving the hospital she continues to be very tired.  She is very disappointed because she still can't hear in her left ear.  She denies chest pain.  She has a lot of sinus congestion but no shortness of breath.  She notes some persistent dizziness and lightheadedness but no syncope.  She takes lasix once per week and has no lower extremity edema, orthopnea or PND.     Past Medical History:  Diagnosis Date  . Ascending aortic aneurysm (HCC)    4.2 cm by CT 08/2015.  Repeat in 1 year.  . Asthma   . Coronary artery disease    s/p Rotablator to mid RCA in 2/12  . Dyslipidemia   . Fatigue   . GERD (gastroesophageal reflux disease)   . Heart attack    1986  . Hypertension   .  PONV (postoperative nausea and vomiting)   . Rectal polyp    hyplastic  . Varicose vein   . Weakness     Past Surgical History:  Procedure Laterality Date  . ABDOMINAL ANGIOGRAM  02/12/2012   Performed by Martinique, Peter M, MD at [facility] CATH LAB  . ABDOMINAL HYSTERECTOMY  1996   total w/BSO  . CATARACT EXTRACTION     june 2016, july 2016  . CORONARY ANGIOPLASTY  2/12   Rotablator to the RCA  . CORONARY STENT PLACEMENT    . HARDWARE REMOVAL Left 02/21/2011   Performed by Aplington, Laurice Record, MD at [facility]  . LEFT HEART CATHETERIZATION WITH CORONARY ANGIOGRAM N/A 02/12/2012   Performed by  Martinique, Peter M, MD at [facility] CATH LAB  . LEG SURGERY Left    rod, pins, screws   . ORIF TIBIA & FIBULA FRACTURES  2000  . polyp removal    . SPINE SURGERY     2015     Current Outpatient Medications  Medication Sig Dispense Refill  . carvedilol (COREG) 12.5 MG tablet Take 1 tablet (12.5 mg total) by mouth 2 (two) times daily. 180 tablet 3  . ezetimibe (ZETIA) 10 MG tablet Take 1 tablet (10 mg total) daily by mouth. 90 tablet 3  . fluticasone (FLONASE) 50 MCG/ACT nasal spray Place 2 sprays into both nostrils 2 (two) times daily. 16 g 2  . Fluticasone-Salmeterol (ADVAIR) 250-50 MCG/DOSE AEPB Inhale 1 puff into the lungs 2 (two) times daily.    . furosemide (LASIX) 20 MG tablet 1 TABLET BY MOUTH ONCE A WEEK 12 tablet 3  . metoprolol succinate (TOPROL-XL) 25 MG 24 hr tablet Take 25 mg daily by mouth.    . montelukast (SINGULAIR) 10 MG tablet Take 1 tablet (10 mg total) by mouth at bedtime. 30 tablet 0  . nitroGLYCERIN (NITROSTAT) 0.4 MG SL tablet Place 1 tablet (0.4 mg total) under the tongue every 5 (five) minutes as needed. For chest pain 25 tablet 3   No current facility-administered medications for this visit.     Allergies:   Morphine; Phenergan [promethazine hcl]; and Imdur [isosorbide dinitrate]    Social History:  The patient  reports that  has never smoked. she has never used smokeless tobacco. She reports that she does not drink alcohol or use drugs.   Family History:  The patient's family history includes Cancer in her daughter and sister; Heart attack in her brother, brother, brother, father, mother, and sister; Heart disease in her brother, daughter, father, mother, and son; Hyperlipidemia in her daughter; Varicose Veins in her daughter.    ROS:  Please see the history of present illness.   Otherwise, review of systems are positive for none.   All other systems are reviewed and negative.    PHYSICAL EXAM: VS:  There were no vitals taken for this visit. , BMI There is no  height or weight on file to calculate BMI. GENERAL:  Well appearing HEENT: Pupils equal round and reactive, fundi not visualized, oral mucosa unremarkable NECK:  No jugular venous distention, waveform within normal limits, carotid upstroke brisk and symmetric, no bruits, no thyromegaly LUNGS:  Clear to auscultation bilaterally HEART:  RRR.  PMI not displaced or sustained,S1 and S2 within normal limits, no S3, no S4, no clicks, no rubs, no murmurs ABD:  Flat, positive bowel sounds normal in frequency in pitch, no bruits, no rebound, no guarding, no midline pulsatile mass, no hepatomegaly, no splenomegaly EXT:  2 plus pulses throughout, no edema, no cyanosis no clubbing SKIN:  No rashes no nodules NEURO:  Cranial nerves II through XII grossly intact, motor grossly intact throughout PSYCH:  Cognitively intact, oriented to person place and time    EKG:  EKG is ordered today. The ekg ordered 04/11/16 demonstrates sinus bradycardia rate 76 bpm.  LAFB.  Absent R wave progression.  01/09/17: Sinus rhythm.  Rate 80 bpm.  LAFB.  Anterior TWI   Lexiscan Myoview 05/28/15:  Nuclear stress EF: 60%.  No T wave inversion was noted during stress.  There was no ST segment deviation noted during stress.  Defect 1: There is a small defect of mild severity.  This is a low risk study.  Small, mild fixed inferolateral defect, likely artifact. No reversible ischemia. LVEF 60% with normal wall motion. This is a low risk study.  Echo 02/06/12: LVEF 55-60%.  Mild mitral regurgitation.    Recent Labs: 05/30/2016: ALT 12; BUN 17; Creat 0.67; Potassium 4.7; Sodium 141    Lipid Panel    Component Value Date/Time   CHOL 335 (H) 05/30/2016 0900   TRIG 135 05/30/2016 0900   HDL 54 05/30/2016 0900   CHOLHDL 6.2 (H) 05/30/2016 0900   VLDL 27 05/30/2016 0900   LDLCALC 254 (H) 05/30/2016 0900   LDLDIRECT 215.3 04/05/2013 1242   08/06/16: Sodium 141, potassium 4.7, BUN 16, creatinine 0.61 Total cholesterol  282, triglycerides 143, HDL 54, LDL 199 AST 17, ALT 24   Wt Readings from Last 3 Encounters:  04/11/16 79.8 kg (176 lb)  12/07/15 77.9 kg (171 lb 12.8 oz)  10/08/15 77.9 kg (171 lb 12.8 oz)      ASSESSMENT AND PLAN:  # CAD s/p MI and RCA PCI:  Chest pain has resolved. She had a stress test that was negative for ischemia 05/2015. Continue aspirin, carvedilol, Imdur and atorvastatin.    # Shortness of breath:  # Tachycardia:  Stable.  Continue metoprolol.   # Hyperlipidemia:  Ms. Righetti cholesterol is >100 despite being on atorvastatin 80 mg daily and Zetia.  We will refer her to the lipid clinic for a PCSK9 inhibitor.  # Hypertension: Blood pressure is better controlled.  She stopped taking lisinopril when she started carvedilol.    # Ascending aorta aneurysm: 4.2 cm on CT 08/2015.  Will check on echo at follow up.  BP control as above.   Current medicines are reviewed at length with the patient today.  The patient does not have concerns regarding medicines.  The following changes have been made: none  Labs/ tests ordered today include:   Orders Placed This Encounter  Procedures  . EKG 12-Lead     Disposition:   FU with Monseratt Ledin C. Oval Linsey, MD, [facility] in 6 months.     This note was written with the assistance of speech recognition software.  Please excuse any transcriptional errors.  Signed, Tove Wideman C. Oval Linsey, MD, [facility]  01/11/2017 10:26 AM    [facility]

## 2017-01-09 NOTE — Patient Instructions (Addendum)
Medication Instructions:  START LASIX (FUROSEMIDE) 20 MG ONCE A WEEK   Labwork: NONE  Testing/Procedures: NONE  Follow-Up: Your physician recommends that you schedule a follow-up appointment in: [place] wants you to follow-up in: [place] will receive a reminder letter in the mail two months in advance. If you don't receive a letter, please call our office to schedule the follow-up appointment  If you need a refill on your cardiac medications before your next appointment, please call your pharmacy.

## 2017-01-11 ENCOUNTER — Encounter: Payer: Self-pay | Admitting: Cardiovascular Disease

## 2017-02-11 ENCOUNTER — Telehealth: Payer: Self-pay | Admitting: Cardiovascular Disease

## 2017-02-11 NOTE — Telephone Encounter (Signed)
Pt says once you get her lab results,please let her know if anything is wrong.

## 2017-02-13 ENCOUNTER — Ambulatory Visit: Payer: Medicare Other

## 2017-03-03 NOTE — Telephone Encounter (Signed)
Notes recorded by Cristopher Estimable, RN on 02/18/2017 at 1:47 PM EST Spoke with pt, aware of dr Blenda Mounts recommendations. She does not want to make an appointment at this time because she does not have her calender at this time. She will call back to schedule. ------  Notes recorded by Skeet Latch, MD on 02/18/2017 at 12:28 PM EST Cholesterol levels remain very elevated. Continue to recommend that she see the lipid clinic and consider PCSK9. Labs otherwise normal.

## 2017-08-18 ENCOUNTER — Encounter: Payer: Self-pay | Admitting: Cardiovascular Disease

## 2017-08-18 ENCOUNTER — Ambulatory Visit (INDEPENDENT_AMBULATORY_CARE_PROVIDER_SITE_OTHER): Payer: Medicare Other | Admitting: Cardiovascular Disease

## 2017-08-18 VITALS — BP 142/88 | HR 83 | Ht 66.0 in | Wt 170.0 lb

## 2017-08-18 DIAGNOSIS — E7800 Pure hypercholesterolemia, unspecified: Secondary | ICD-10-CM

## 2017-08-18 DIAGNOSIS — Z955 Presence of coronary angioplasty implant and graft: Secondary | ICD-10-CM

## 2017-08-18 DIAGNOSIS — I712 Thoracic aortic aneurysm, without rupture: Secondary | ICD-10-CM | POA: Diagnosis not present

## 2017-08-18 DIAGNOSIS — I10 Essential (primary) hypertension: Secondary | ICD-10-CM

## 2017-08-18 DIAGNOSIS — Z5181 Encounter for therapeutic drug level monitoring: Secondary | ICD-10-CM

## 2017-08-18 DIAGNOSIS — I7121 Aneurysm of the ascending aorta, without rupture: Secondary | ICD-10-CM

## 2017-08-18 MED ORDER — LISINOPRIL 20 MG PO TABS: 20.0000 mg | ORAL_TABLET | Freq: Every day | ORAL | 3 refills | Status: DC

## 2017-08-18 NOTE — Patient Instructions (Addendum)
Medication Instructions:   RESUME lisinopril 20mg  daily  Labwork:  BMET (non-fasting lab) in [place] @ PCP office  Testing/Procedures:  Your physician has requested that you have an echocardiogram. Echocardiography is a painless test that uses sound waves to create images of your heart. It provides your doctor with information about the size and shape of your heart and how well your heart's chambers and valves are working. This procedure takes approximately one hour. There are no restrictions for this procedure. -- done at [address]  Follow-Up:  Dr. Oval Linsey has requested that you schedule an appointment with one of our clinical pharmacists for a blood pressure check appointment & lipid clinic appointment (Repatha) in 4 weeks.  If you monitor your blood pressure (BP) at home, please bring your BP cuff and your BP readings with you to this appointment  Your physician recommends that you schedule a follow-up appointment in 4 months with Dr. Oval Linsey    If you need a refill on your cardiac medications before your next appointment, please call your pharmacy.  Any Other Special Instructions Will Be Listed Below (If Applicable).

## 2017-08-18 NOTE — Progress Notes (Signed)
Cardiology Office Note   Date:  08/18/2017   ID:  Lindsey Middleton, DOB Apr 12, 1940, MRN [id number]  PCP:  Ephriam Jenkins E  Cardiologist:   Skeet Latch, MD   No chief complaint on file.   History of Present Illness: Lindsey Middleton is a 77 y.o. female with CAD, hypertension, hyperlipidemia, mild ascending aortic aneurysm, and syncope who presents for follow up.  Lindsey Middleton was previously a patient of [name].  She had an MI in 1986 and in 2012 that was treated with DES to the mid RCA.  She had a cath in 2013 at which time the stent was patent and she had non-obstructive CAD.  She had a [employer] 05/2015 as a pre-op clearance and it was low risk for ischemia.  She last saw [name] 07/2015.  At that appointment her blood pressure was not well-controlled so lisinopril was increased to 20 mg daily.  Her blood pressure was still poorly controlled on 09/14/15.  Imdur was added to her regimen due to chest discomfort and poorly controlled hypertension. She was also noted to be tachycardic and reported increased shortness of breath as well as lower extremity edema. Therefore, she was referred for lower extremity Dopplers that were negative for DVT.  She had a chest CT-A on 09/17/15 that was negative for pulmonary embolism. It showed moderate coronary artery calcifications and a mild ascending aortic aneurysm.  Her lipids have been poorly-controlled.  Her LDL has been over 170 on atorvastatin 80 mg daily so Zetia was added to her regimen.  Her lipids remained elevated so she was referred to the lipid clinic for consideration of a PCSK9 inhibitor.  However she has been hesitant about starting the injections.  Her daughter reports that she would be able to give her injections.    Lindsey Middleton has been doing well since her last appointment.  She has no chest pain or shortness of breath.  She is scheduled to have a repeat surgery on her eye in July.  She walks with her husband daily for 30 minutes  without chest pain or shortness of breath.  She has no edema, orthopnea or PND.  Her blood pressure at home has been in the 140s-150s.  She denies lower extremity edema, orthopnea or PND.     Past Medical History:  Diagnosis Date  . Ascending aortic aneurysm (HCC)    4.2 cm by CT 08/2015.  Repeat in 1 year.  . Asthma   . Coronary artery disease    s/p Rotablator to mid RCA in 2/12  . Dyslipidemia   . Fatigue   . GERD (gastroesophageal reflux disease)   . Heart attack ([place])    1986  . Hypertension   . PONV (postoperative nausea and vomiting)   . Rectal polyp    hyplastic  . Varicose vein   . Weakness     Past Surgical History:  Procedure Laterality Date  . ABDOMINAL ANGIOGRAM  02/12/2012   Procedure: ABDOMINAL ANGIOGRAM;  Surgeon: Peter M Martinique, MD;  Location: [facility] CATH LAB;  Service: Cardiovascular;;  . ABDOMINAL HYSTERECTOMY  1996   total w/BSO  . CATARACT EXTRACTION     june 2016, july 2016  . CORONARY ANGIOPLASTY  2/12   Rotablator to the RCA  . CORONARY STENT PLACEMENT    . HARDWARE REMOVAL  02/21/2011   Procedure: HARDWARE REMOVAL;  Surgeon: Laurice Record Aplington;  Location: [facility];  Service: Orthopedics;  Laterality: Left;  REMOVAL TWO SCREWS DISTAL LEFT TIBIA   . LEFT HEART CATHETERIZATION WITH CORONARY ANGIOGRAM N/A 02/12/2012   Procedure: LEFT HEART CATHETERIZATION WITH CORONARY ANGIOGRAM;  Surgeon: Peter M Martinique, MD;  Location: [facility] CATH LAB;  Service: Cardiovascular;  Laterality: N/A;  . LEG SURGERY Left    rod, pins, screws   . ORIF TIBIA & FIBULA FRACTURES  2000  . polyp removal    . SPINE SURGERY     2015     Current Outpatient Medications  Medication Sig Dispense Refill  . carvedilol (COREG) 12.5 MG tablet Take 1 tablet (12.5 mg total) by mouth 2 (two) times daily. 180 tablet 3  . ezetimibe (ZETIA) 10 MG tablet Take 1 tablet (10 mg total) daily by mouth. 90 tablet 3  . fluticasone (FLONASE) 50 MCG/ACT nasal spray Place 2 sprays into both  nostrils 2 (two) times daily. 16 g 2  . Fluticasone-Salmeterol (ADVAIR) 250-50 MCG/DOSE AEPB Inhale 1 puff into the lungs 2 (two) times daily.    . furosemide (LASIX) 20 MG tablet 1 TABLET BY MOUTH ONCE A WEEK 12 tablet 3  . montelukast (SINGULAIR) 10 MG tablet Take 1 tablet (10 mg total) by mouth at bedtime. 30 tablet 0  . nitroGLYCERIN (NITROSTAT) 0.4 MG SL tablet Place 1 tablet (0.4 mg total) under the tongue every 5 (five) minutes as needed. For chest pain 25 tablet 3  . lisinopril (PRINIVIL,ZESTRIL) 20 MG tablet Take 1 tablet (20 mg total) by mouth daily. 90 tablet 3   No current facility-administered medications for this visit.     Allergies:   Morphine; Phenergan [promethazine hcl]; and Imdur [isosorbide dinitrate]    Social History:  The patient  reports that she has never smoked. She has never used smokeless tobacco. She reports that she does not drink alcohol or use drugs.   Family History:  The patient's family history includes Cancer in her daughter and sister; Heart attack in her brother, brother, brother, father, mother, and sister; Heart disease in her brother, daughter, father, mother, and son; Hyperlipidemia in her daughter; Varicose Veins in her daughter.    ROS:  Please see the history of present illness.   Otherwise, review of systems are positive for none.   All other systems are reviewed and negative.    PHYSICAL EXAM: VS:  BP (!) 142/88   Pulse 83   Ht 5\' 6"  (1.676 m)   Wt 170 lb (77.1 kg)   BMI 27.44 kg/m  , BMI Body mass index is 27.44 kg/m. GENERAL:  Well appearing HEENT: Pupils equal round and reactive, fundi not visualized, oral mucosa unremarkable NECK:  No jugular venous distention, waveform within normal limits, carotid upstroke brisk and symmetric, no bruits LUNGS:  Clear to auscultation bilaterally HEART:  RRR.  PMI not displaced or sustained,S1 and S2 within normal limits, no S3, no S4, no clicks, no rubs, no murmurs ABD:  Flat, positive bowel  sounds normal in frequency in pitch, no bruits, no rebound, no guarding, no midline pulsatile mass, no hepatomegaly, no splenomegaly EXT:  2 plus pulses throughout, no edema, no cyanosis no clubbing SKIN:  No rashes no nodules NEURO:  Cranial nerves II through XII grossly intact, motor grossly intact throughout PSYCH:  Cognitively intact, oriented to person place and time   EKG:  EKG is ordered today. The ekg ordered 04/11/16 demonstrates sinus bradycardia rate 76 bpm.  LAFB.  Absent R wave progression.  01/09/17: Sinus rhythm.  Rate 80 bpm.  LAFB.  Anterior  TWI  08/18/17: Sinus rhythm.  Rate 83 bpm.  R axis deviation.    Lexiscan Myoview 05/28/15:  Nuclear stress EF: 60%.  No T wave inversion was noted during stress.  There was no ST segment deviation noted during stress.  Defect 1: There is a small defect of mild severity.  This is a low risk study.  Small, mild fixed inferolateral defect, likely artifact. No reversible ischemia. LVEF 60% with normal wall motion. This is a low risk study.  Echo 02/06/12: LVEF 55-60%.  Mild mitral regurgitation.    Recent Labs: No results found for requested labs within last 8760 hours.    Lipid Panel    Component Value Date/Time   CHOL 335 (H) 05/30/2016 0900   TRIG 135 05/30/2016 0900   HDL 54 05/30/2016 0900   CHOLHDL 6.2 (H) 05/30/2016 0900   VLDL 27 05/30/2016 0900   LDLCALC 254 (H) 05/30/2016 0900   LDLDIRECT 215.3 04/05/2013 1242   08/06/16: Sodium 141, potassium 4.7, BUN 16, creatinine 0.61 Total cholesterol 282, triglycerides 143, HDL 54, LDL 199 AST 17, ALT 24   Wt Readings from Last 3 Encounters:  08/18/17 170 lb (77.1 kg)  04/11/16 176 lb (79.8 kg)  12/07/15 171 lb 12.8 oz (77.9 kg)      ASSESSMENT AND PLAN:  # CAD s/p MI and RCA PCI:  Chest pain has resolved. She had a stress test that was negative for ischemia 05/2015. Continue aspirin, carvedilol, Imdur and atorvastatin.    # Shortness of breath:  #  Tachycardia:  Stable.  Continue carvedilol.  # Hyperlipidemia:  Lindsey Middleton cholesterol is >100 despite being on atorvastatin 80 mg daily and Zetia.  She didn't tolerate atorvastatin.  She is willing to try Repatha.  Will refer to PharmD.    # Hypertension: Blood pressure is above goal.  She never started back taking lisinoopril.  Continue carvedilol and add lisinopril 20 mg.  Check BMP with PCP in 1 week.   # Ascending aorta aneurysm: 4.2 cm on CT 08/2015.  Repeat echo.  Continue beta blocker.  Current medicines are reviewed at length with the patient today.  The patient does not have concerns regarding medicines.  The following changes have been made: none  Labs/ tests ordered today include:   Orders Placed This Encounter  Procedures  . Basic metabolic panel  . EKG 12-Lead  . ECHOCARDIOGRAM COMPLETE     Disposition:   FU with Lindsey Karwowski C. Oval Linsey, MD, [facility] in 6 months.      Signed, Leeona Mccardle C. Oval Linsey, MD, [facility]  08/18/2017 11:38 AM    [facility]

## 2017-08-20 NOTE — H&P (Signed)
Subjective:    Lindsey Middleton is a 77 y.o. female who presents for evaluation of left sided nasolacrimal duct obstruction. The pain is described as minimal. Onset was several months ago. Symptoms have been unchanged since.   Review of Systems Pertinent items are noted in HPI.    Objective:   There were no vitals taken for this visit.  General:  alert, cooperative and appears stated age Skin:  normal Eyes: conjunctivae/corneas clear. PERRL, EOM's intact. Fundi benign. Mouth: MMM no lesions Lymph Nodes:  Cervical, supraclavicular, and axillary nodes normal. Lungs:  clear to auscultation bilaterally Heart:  regular rate and rhythm, S1, S2 normal, no murmur, click, rub or gallop Abdomen: soft, non-tender; bowel sounds normal; no masses,  no organomegaly CVA:  absent Genitourinary: defer exam Extremities:  extremities normal, atraumatic, no cyanosis or edema Neurologic:  negative Psychiatric:  normal mood, behavior, speech, dress, and thought processes    Assessment: LEFT Nasolacrimal duct obstruction   Plan: Dacryocystorhinostomy with Anterior Ethmoidectomy with Dilation Probing and Stent Placement LEFT SIDE  1. Discussed the risk of surgery,  and the risks of general anesthetic including MI, CVA, sudden death or even reaction to anesthetic medications. The patient understands the risks, any and all questions were answered to the patient's satisfaction. 2. Follow up: 1 week.  Date of Surgery Update (To be completed by Attending Surgeon day of surgery.)

## 2017-08-28 ENCOUNTER — Ambulatory Visit (HOSPITAL_COMMUNITY): Payer: Medicare Other | Attending: Cardiovascular Disease

## 2017-08-28 ENCOUNTER — Other Ambulatory Visit: Payer: Self-pay

## 2017-08-28 ENCOUNTER — Other Ambulatory Visit: Payer: Medicare Other | Admitting: *Deleted

## 2017-08-28 ENCOUNTER — Telehealth: Payer: Self-pay | Admitting: Cardiovascular Disease

## 2017-08-28 DIAGNOSIS — I071 Rheumatic tricuspid insufficiency: Secondary | ICD-10-CM | POA: Insufficient documentation

## 2017-08-28 DIAGNOSIS — I712 Thoracic aortic aneurysm, without rupture: Secondary | ICD-10-CM | POA: Diagnosis not present

## 2017-08-28 DIAGNOSIS — I252 Old myocardial infarction: Secondary | ICD-10-CM | POA: Diagnosis not present

## 2017-08-28 DIAGNOSIS — J45909 Unspecified asthma, uncomplicated: Secondary | ICD-10-CM | POA: Insufficient documentation

## 2017-08-28 DIAGNOSIS — I10 Essential (primary) hypertension: Secondary | ICD-10-CM | POA: Diagnosis not present

## 2017-08-28 DIAGNOSIS — E785 Hyperlipidemia, unspecified: Secondary | ICD-10-CM | POA: Diagnosis not present

## 2017-08-28 DIAGNOSIS — Z8249 Family history of ischemic heart disease and other diseases of the circulatory system: Secondary | ICD-10-CM | POA: Insufficient documentation

## 2017-08-28 DIAGNOSIS — I2510 Atherosclerotic heart disease of native coronary artery without angina pectoris: Secondary | ICD-10-CM | POA: Insufficient documentation

## 2017-08-28 DIAGNOSIS — I7121 Aneurysm of the ascending aorta, without rupture: Secondary | ICD-10-CM

## 2017-08-28 LAB — BASIC METABOLIC PANEL
BUN/Creatinine Ratio: 18 (ref 12–28)
BUN: 13 mg/dL (ref 8–27)
CALCIUM: 10.1 mg/dL (ref 8.7–10.3)
CO2: 25 mmol/L (ref 20–29)
CREATININE: 0.71 mg/dL (ref 0.57–1.00)
Chloride: 106 mmol/L (ref 96–106)
GFR calc Af Amer: 95 mL/min/{1.73_m2} (ref >59)
GFR calc non Af Amer: 82 mL/min/{1.73_m2} (ref >59)
Glucose: 98 mg/dL (ref 65–99)
Potassium: 4.8 mmol/L (ref 3.5–5.2)
Sodium: 143 mmol/L (ref 134–144)

## 2017-08-28 NOTE — Telephone Encounter (Signed)
Spoke with pt dtr, she is concerned because the patient told her the tech that did her echo told her she had a heart attack in 2017 and she had a clot in her aorta. Looking in the patients chart and dr [place]'s note, there is no mention of a MI in 2017. The dtr is concerned about what is being told to the patient during testing. Assured patients dtr I will make the supervisor of that department aware. She does not know the name of the tech. Will forward to vanessa douglas

## 2017-08-28 NOTE — Telephone Encounter (Signed)
New Message   Pt's daughter states the pt had an echo today and she has questions for the nurse regarding what the technician told her. Please call

## 2017-09-01 ENCOUNTER — Other Ambulatory Visit (HOSPITAL_COMMUNITY): Payer: Self-pay | Admitting: Oculoplastics Ophthalmology

## 2017-09-01 ENCOUNTER — Other Ambulatory Visit: Payer: Self-pay

## 2017-09-01 ENCOUNTER — Ambulatory Visit (HOSPITAL_COMMUNITY)
Admission: RE | Admit: 2017-09-01 | Discharge: 2017-09-01 | Disposition: A | Discharge status: Home / Self Care | Payer: Medicare Other | Source: Ambulatory Visit | Attending: Oculoplastics Ophthalmology | Admitting: Oculoplastics Ophthalmology

## 2017-09-01 ENCOUNTER — Encounter (HOSPITAL_COMMUNITY): Payer: Self-pay

## 2017-09-01 ENCOUNTER — Encounter (HOSPITAL_COMMUNITY): Payer: Self-pay | Admitting: Urology

## 2017-09-01 ENCOUNTER — Telehealth (HOSPITAL_COMMUNITY): Payer: Self-pay | Admitting: Radiology

## 2017-09-01 DIAGNOSIS — H04552 Acquired stenosis of left nasolacrimal duct: Secondary | ICD-10-CM | POA: Diagnosis not present

## 2017-09-01 NOTE — Progress Notes (Signed)
Anesthesia Chart Review: same day workup  Case:  [id number] Date/Time:  09/03/17 0715   Procedures:      LACRIMAL DUCT EXPLORATION (Left )     ETHMOIDECTOMY (Left )   Anesthesia type:  General   Pre-op diagnosis:  OBSTRUCTION OF LACRIMAL DUCT   Location:  [facility] OR ROOM 09 / [facility]   Surgeon:  Clista Bernhardt, MD      DISCUSSION: 77 yo female never smoker scheduled for the above procedure. Pertinent medical hx includes PONV, GERD, HTN, Asthma, MI in 1986 and in 2012 with stent to RCA, mild ascending aortic aneurysm.   Per last cardiology note 08/18/2017 pt was doing well, stable, no CP or SOB. Walking daily for 30 minutes without symptoms. Noted that pt would be having upcoming eye surgery. EF 55-60% by ECHO 08/28/2017.  Anticipate pt can proceed with surgery as scheduled barring and acute status change.   PROVIDERS: Ephriam Jenkins E is PCP at [facility] MD is Cardiologist last seen 08/18/2017  LABS: Labs reviewed: Acceptable for surgery. (all labs ordered are listed, but only abnormal results are displayed)  Lab Results  Component Value Date   WBC 4.8 03/29/2013   HGB 14.2 03/29/2013   HCT 42.7 03/29/2013   PLT 204 03/29/2013   GLUCOSE 98 08/28/2017   CHOL 335 (H) 05/30/2016   TRIG 135 05/30/2016   HDL 54 05/30/2016   LDLDIRECT 215.3 04/05/2013   LDLCALC 254 (H) 05/30/2016   ALT 12 05/30/2016   AST 15 05/30/2016   NA 143 08/28/2017   K 4.8 08/28/2017   CL 106 08/28/2017   CREATININE 0.71 08/28/2017   BUN 13 08/28/2017   CO2 25 08/28/2017   INR 1.1 (H) 02/09/2012     IMAGES: CTA Chest 09/17/2015: IMPRESSION: 1. No evidence of a pulmonary embolus. 2. Mild dependent subsegmental atelectasis. Minor areas of lung scarring. Small left lower lobe pulmonary nodule. These findings are similar to the prior chest CT from 03/28/2013. No evidence of pneumonia or convincing pulmonary edema. 3. Mild cardiomegaly.  Moderate coronary artery  calcifications. 4. Ascending thoracic aorta measures 4.2 cm in diameter, without significant change from the prior chest CT. Recommend annual imaging followup by CTA or MRA. This recommendation follows 2010 ACCF/AHA/AATS/ACR/ASA/SCA/SCAI/SIR/STS/SVM Guidelines for the Diagnosis and Management of Patients with Thoracic Aortic Disease. Circulation. 2010; 121: E332-R518  EKG: 08/18/2017: NSR, RAD  CV: ECHO 08/28/2017 - Left ventricle: The cavity size was normal. Wall thickness was   normal. Systolic function was normal. The estimated ejection   fraction was in the range of 55% to 60%. Doppler parameters are   consistent with abnormal left ventricular relaxation (grade 1   diastolic dysfunction). - Aortic valve: Calcified non coronary cusp. - Ascending aorta: The ascending aorta was mildly dilated. - Mitral valve: Calcified annulus. - Atrial septum: No defect or patent foramen ovale was identified.  Lexiscan 05/28/2015  Nuclear stress EF: 60%.  No T wave inversion was noted during stress.  There was no ST segment deviation noted during stress.  Defect 1: There is a small defect of mild severity.  This is a low risk study.   Small, mild fixed inferolateral defect, likely artifact. No reversible ischemia. LVEF 60% with normal wall motion. This is a low risk study.  Past Medical History:  Diagnosis Date  . Ascending aortic aneurysm (HCC)    4.2 cm by CT 08/2015.  Repeat in 1 year.  . Asthma   . Coronary artery disease  s/p Rotablator to mid RCA in 2/12  . Dyslipidemia   . Fatigue   . GERD (gastroesophageal reflux disease)   . Heart attack ([place])    1986  . Hypertension   . PONV (postoperative nausea and vomiting)   . Rectal polyp    hyplastic  . Varicose vein   . Weakness     Past Surgical History:  Procedure Laterality Date  . ABDOMINAL ANGIOGRAM  02/12/2012   Procedure: ABDOMINAL ANGIOGRAM;  Surgeon: Peter M Martinique, MD;  Location: [facility] CATH LAB;  Service:  Cardiovascular;;  . ABDOMINAL HYSTERECTOMY  1996   total w/BSO  . CATARACT EXTRACTION     june 2016, july 2016  . CORONARY ANGIOPLASTY  2/12   Rotablator to the RCA  . CORONARY STENT PLACEMENT    . HARDWARE REMOVAL  02/21/2011   Procedure: HARDWARE REMOVAL;  Surgeon: Laurice Record Aplington;  Location: [facility];  Service: Orthopedics;  Laterality: Left;  REMOVAL TWO SCREWS DISTAL LEFT TIBIA   . LEFT HEART CATHETERIZATION WITH CORONARY ANGIOGRAM N/A 02/12/2012   Procedure: LEFT HEART CATHETERIZATION WITH CORONARY ANGIOGRAM;  Surgeon: Peter M Martinique, MD;  Location: [facility] CATH LAB;  Service: Cardiovascular;  Laterality: N/A;  . LEG SURGERY Left    rod, pins, screws   . ORIF TIBIA & FIBULA FRACTURES  2000  . polyp removal    . SPINE SURGERY     2015    MEDICATIONS: No current facility-administered medications for this encounter.    Marland Kitchen ALPRAZolam (XANAX) 0.25 MG tablet  . azelastine (ASTELIN) 0.1 % nasal spray  . carvedilol (COREG) 12.5 MG tablet  . ezetimibe (ZETIA) 10 MG tablet  . furosemide (LASIX) 20 MG tablet  . levocetirizine (XYZAL) 5 MG tablet  . lisinopril (PRINIVIL,ZESTRIL) 20 MG tablet  . nitroGLYCERIN (NITROSTAT) 0.4 MG SL tablet    Wynonia Musty [facility] Short Stay Center/Anesthesiology Phone [phone] 09/01/2017 4:11 PM

## 2017-09-01 NOTE — Telephone Encounter (Signed)
I called and spoke with patient's daughter, Lindsey Middleton. I informed her, that I spoke with the sonographer that performed the echocardiogram.  She did not inform the patient of a MI in 2017 or a blood clot. She was referring to the MI in 1986 and the testing for blood clots in 2017 noted in Dr. Blenda Mounts office note. Lindsey Middleton stated that the family was never informed of an [place]. They were told that the patient had an angina attack. She says she is aware that sometimes what a patient is told, what a patient hears and what patient repeats can be very different. I apologized for the misunderstanding. I stated, if there is anything else I can do. Please do not hesitate to call.

## 2017-09-01 NOTE — Progress Notes (Signed)
Pre-op phone call completed. Pt verbalized understanding. Pt reports no recent chest pain, SOB or signs of infection. Pt states she recently saw Dr. Oval Linsey with cardiology, but is unsure whether MD knows about upcoming surgery.

## 2017-09-03 ENCOUNTER — Other Ambulatory Visit: Payer: Self-pay

## 2017-09-03 ENCOUNTER — Encounter (HOSPITAL_COMMUNITY)
Admission: RE | Disposition: A | Discharge status: Home / Self Care | Payer: Self-pay | Source: Ambulatory Visit | Attending: Oculoplastics Ophthalmology

## 2017-09-03 ENCOUNTER — Encounter (HOSPITAL_COMMUNITY): Payer: Self-pay | Admitting: *Deleted

## 2017-09-03 ENCOUNTER — Ambulatory Visit (HOSPITAL_COMMUNITY)
Admission: RE | Admit: 2017-09-03 | Discharge: 2017-09-03 | Disposition: A | Discharge status: Home / Self Care | Payer: Medicare Other | Source: Ambulatory Visit | Attending: Oculoplastics Ophthalmology | Admitting: Oculoplastics Ophthalmology

## 2017-09-03 ENCOUNTER — Ambulatory Visit (HOSPITAL_COMMUNITY): Payer: Medicare Other | Admitting: Physician Assistant

## 2017-09-03 DIAGNOSIS — I712 Thoracic aortic aneurysm, without rupture: Secondary | ICD-10-CM | POA: Insufficient documentation

## 2017-09-03 DIAGNOSIS — I252 Old myocardial infarction: Secondary | ICD-10-CM | POA: Insufficient documentation

## 2017-09-03 DIAGNOSIS — I2510 Atherosclerotic heart disease of native coronary artery without angina pectoris: Secondary | ICD-10-CM | POA: Insufficient documentation

## 2017-09-03 DIAGNOSIS — Z79899 Other long term (current) drug therapy: Secondary | ICD-10-CM | POA: Diagnosis not present

## 2017-09-03 DIAGNOSIS — I10 Essential (primary) hypertension: Secondary | ICD-10-CM | POA: Insufficient documentation

## 2017-09-03 DIAGNOSIS — H04552 Acquired stenosis of left nasolacrimal duct: Secondary | ICD-10-CM | POA: Diagnosis not present

## 2017-09-03 DIAGNOSIS — H04412 Chronic dacryocystitis of left lacrimal passage: Secondary | ICD-10-CM | POA: Insufficient documentation

## 2017-09-03 DIAGNOSIS — E785 Hyperlipidemia, unspecified: Secondary | ICD-10-CM | POA: Diagnosis not present

## 2017-09-03 DIAGNOSIS — Z955 Presence of coronary angioplasty implant and graft: Secondary | ICD-10-CM | POA: Diagnosis not present

## 2017-09-03 HISTORY — PX: LACRIMAL DUCT EXPLORATION: SHX6569

## 2017-09-03 HISTORY — PX: ETHMOIDECTOMY: SHX5197

## 2017-09-03 LAB — CBC
HCT: 42.4 % (ref 36.0–46.0)
Hemoglobin: 13.2 g/dL (ref 12.0–15.0)
MCH: 29.7 pg (ref 26.0–34.0)
MCHC: 31.1 g/dL (ref 30.0–36.0)
MCV: 95.3 fL (ref 78.0–100.0)
PLATELETS: 209 10*3/uL (ref 150–400)
RBC: 4.45 MIL/uL (ref 3.87–5.11)
RDW: 12.4 % (ref 11.5–15.5)
WBC: 5.2 10*3/uL (ref 4.0–10.5)

## 2017-09-03 LAB — PROTIME-INR
INR: 0.93
PROTHROMBIN TIME: 12.4 s (ref 11.4–15.2)

## 2017-09-03 SURGERY — EXPLORATION, LACRIMAL DUCT
Anesthesia: General | Site: Eye | Laterality: Left

## 2017-09-03 MED ORDER — PHENYLEPHRINE HCL 10 MG/ML IJ SOLN
INTRAMUSCULAR | Status: AC
  Filled 2017-09-03: qty 1

## 2017-09-03 MED ORDER — MIDAZOLAM HCL 5 MG/5ML IJ SOLN
INTRAMUSCULAR | Status: DC | PRN
  Administered 2017-09-03: 1 mg via INTRAVENOUS

## 2017-09-03 MED ORDER — OXYCODONE HCL 5 MG PO TABS: 5.0000 mg | ORAL_TABLET | Freq: Once | ORAL | Status: DC | PRN

## 2017-09-03 MED ORDER — BSS IO SOLN
INTRAOCULAR | Status: AC
  Filled 2017-09-03: qty 15

## 2017-09-03 MED ORDER — OXYCODONE HCL 5 MG/5ML PO SOLN: 5.0000 mg | Freq: Once | ORAL | Status: DC | PRN

## 2017-09-03 MED ORDER — LACTATED RINGERS IV SOLN
INTRAVENOUS | Status: DC | PRN
  Administered 2017-09-03: 07:00:00 via INTRAVENOUS

## 2017-09-03 MED ORDER — DEXTROSE 5 % IV SOLN
INTRAVENOUS | Status: DC | PRN
  Administered 2017-09-03: 40 ug/min via INTRAVENOUS

## 2017-09-03 MED ORDER — ONDANSETRON HCL 4 MG/2ML IJ SOLN
INTRAMUSCULAR | Status: DC | PRN
  Administered 2017-09-03: 4 mg via INTRAVENOUS

## 2017-09-03 MED ORDER — LIDOCAINE-EPINEPHRINE 1 %-1:100000 IJ SOLN
INTRAMUSCULAR | Status: AC
  Filled 2017-09-03: qty 1

## 2017-09-03 MED ORDER — ONDANSETRON HCL 4 MG/2ML IJ SOLN
INTRAMUSCULAR | Status: AC
  Filled 2017-09-03: qty 2

## 2017-09-03 MED ORDER — BUPIVACAINE HCL (PF) 0.5 % IJ SOLN
INTRAMUSCULAR | Status: AC
  Filled 2017-09-03: qty 30

## 2017-09-03 MED ORDER — PROPOFOL 10 MG/ML IV BOLUS
INTRAVENOUS | Status: DC | PRN
  Administered 2017-09-03: 30 mg via INTRAVENOUS
  Administered 2017-09-03: 120 mg via INTRAVENOUS
  Administered 2017-09-03: 50 mg via INTRAVENOUS

## 2017-09-03 MED ORDER — FENTANYL CITRATE (PF) 250 MCG/5ML IJ SOLN
INTRAMUSCULAR | Status: AC
  Filled 2017-09-03: qty 5

## 2017-09-03 MED ORDER — PHENYLEPHRINE 40 MCG/ML (10ML) SYRINGE FOR IV PUSH (FOR BLOOD PRESSURE SUPPORT)
PREFILLED_SYRINGE | INTRAVENOUS | Status: DC | PRN
  Administered 2017-09-03 (×4): 80 ug via INTRAVENOUS

## 2017-09-03 MED ORDER — DEXAMETHASONE SODIUM PHOSPHATE 10 MG/ML IJ SOLN
INTRAMUSCULAR | Status: DC | PRN
  Administered 2017-09-03: 4 mg via INTRAVENOUS

## 2017-09-03 MED ORDER — SCOPOLAMINE 1 MG/3DAYS TD PT72
MEDICATED_PATCH | TRANSDERMAL | Status: DC | PRN
  Administered 2017-09-03: 1 via TRANSDERMAL

## 2017-09-03 MED ORDER — TOBRAMYCIN-DEXAMETHASONE 0.3-0.1 % OP OINT
TOPICAL_OINTMENT | OPHTHALMIC | Status: AC
  Filled 2017-09-03: qty 3.5

## 2017-09-03 MED ORDER — OXYMETAZOLINE HCL 0.05 % NA SOLN
NASAL | Status: DC | PRN
  Administered 2017-09-03: 1 via TOPICAL

## 2017-09-03 MED ORDER — LIDOCAINE 2% (20 MG/ML) 5 ML SYRINGE
INTRAMUSCULAR | Status: AC
  Filled 2017-09-03: qty 5

## 2017-09-03 MED ORDER — LIDOCAINE HCL 1 % IJ SOLN
INTRAMUSCULAR | Status: DC | PRN
  Administered 2017-09-03: 4 mL

## 2017-09-03 MED ORDER — PHENYLEPHRINE 40 MCG/ML (10ML) SYRINGE FOR IV PUSH (FOR BLOOD PRESSURE SUPPORT)
PREFILLED_SYRINGE | INTRAVENOUS | Status: AC
  Filled 2017-09-03: qty 10

## 2017-09-03 MED ORDER — ROCURONIUM BROMIDE 10 MG/ML (PF) SYRINGE
PREFILLED_SYRINGE | INTRAVENOUS | Status: AC
  Filled 2017-09-03: qty 10

## 2017-09-03 MED ORDER — HEMOSTATIC AGENTS (NO CHARGE) OPTIME
TOPICAL | Status: DC | PRN
  Administered 2017-09-03: 1 via TOPICAL

## 2017-09-03 MED ORDER — TOBRAMYCIN-DEXAMETHASONE 0.3-0.1 % OP OINT
TOPICAL_OINTMENT | OPHTHALMIC | Status: DC | PRN
  Administered 2017-09-03: 1 via OPHTHALMIC

## 2017-09-03 MED ORDER — EPINEPHRINE HCL (NASAL) 0.1 % NA SOLN
NASAL | Status: AC
  Filled 2017-09-03: qty 30

## 2017-09-03 MED ORDER — DEXAMETHASONE SODIUM PHOSPHATE 10 MG/ML IJ SOLN
INTRAMUSCULAR | Status: AC
  Filled 2017-09-03: qty 1

## 2017-09-03 MED ORDER — PROPOFOL 500 MG/50ML IV EMUL
INTRAVENOUS | Status: DC | PRN
  Administered 2017-09-03: 150 ug/kg/min via INTRAVENOUS

## 2017-09-03 MED ORDER — OXYMETAZOLINE HCL 0.05 % NA SOLN
NASAL | Status: AC
  Filled 2017-09-03: qty 15

## 2017-09-03 MED ORDER — FENTANYL CITRATE (PF) 100 MCG/2ML IJ SOLN: 25.0000 ug | INTRAMUSCULAR | Status: DC | PRN

## 2017-09-03 MED ORDER — HYDROCODONE-ACETAMINOPHEN 5-325 MG PO TABS: 2.0000 | ORAL_TABLET | Freq: Four times a day (QID) | ORAL | 0 refills | Status: AC | PRN

## 2017-09-03 MED ORDER — PREDNISONE 10 MG PO TABS: 20.0000 mg | ORAL_TABLET | Freq: Every day | ORAL | 0 refills | Status: AC

## 2017-09-03 MED ORDER — MIDAZOLAM HCL 2 MG/2ML IJ SOLN
INTRAMUSCULAR | Status: AC
  Filled 2017-09-03: qty 2

## 2017-09-03 MED ORDER — PROPOFOL 10 MG/ML IV BOLUS
INTRAVENOUS | Status: AC
  Filled 2017-09-03: qty 20

## 2017-09-03 MED ORDER — LIDOCAINE 2% (20 MG/ML) 5 ML SYRINGE
INTRAMUSCULAR | Status: DC | PRN
  Administered 2017-09-03: 70 mg via INTRAVENOUS

## 2017-09-03 SURGICAL SUPPLY — 33 items
APPLICATOR DR MATTHEWS STRL (MISCELLANEOUS) ×2 IMPLANT
BLADE EYE CATARACT 19 1.4 BEAV (BLADE) ×2 IMPLANT
BLADE SURG 15 STRL LF DISP TIS (BLADE) ×1 IMPLANT
BLADE SURG 15 STRL SS (BLADE) ×1
CLEANER TIP ELECTROSURG 2X2 (MISCELLANEOUS) ×2 IMPLANT
CLSR STERI-STRIP ANTIMIC 1/2X4 (GAUZE/BANDAGES/DRESSINGS) ×2 IMPLANT
COAGULATOR SUCT SWTCH 10FR 6 (ELECTROSURGICAL) ×2 IMPLANT
CORD BI POLAR (MISCELLANEOUS) ×2 IMPLANT
COVER SURGICAL LIGHT HANDLE (MISCELLANEOUS) ×2 IMPLANT
DRAPE ORTHO SPLIT 87X125 STRL (DRAPES) ×2 IMPLANT
FORCEPS BIPOLAR SPETZLER 8 1.0 (NEUROSURGERY SUPPLIES) ×2 IMPLANT
GLOVE BIO SURGEON STRL SZ7.5 (GLOVE) ×4 IMPLANT
GOWN STRL REUS W/ TWL LRG LVL3 (GOWN DISPOSABLE) ×2 IMPLANT
GOWN STRL REUS W/TWL LRG LVL3 (GOWN DISPOSABLE) ×2
KIT BASIN OR (CUSTOM PROCEDURE TRAY) ×2 IMPLANT
NEEDLE HYPO 30X.5 LL (NEEDLE) ×2 IMPLANT
NS IRRIG 1000ML POUR BTL (IV SOLUTION) ×2 IMPLANT
PACK CATARACT CUSTOM (CUSTOM PROCEDURE TRAY) ×2 IMPLANT
PAD ARMBOARD 7.5X6 YLW CONV (MISCELLANEOUS) ×4 IMPLANT
PATTIES SURGICAL 1/4 X 3 (GAUZE/BANDAGES/DRESSINGS) ×2 IMPLANT
PENCIL BUTTON HOLSTER BLD 10FT (ELECTRODE) ×2 IMPLANT
SET INTBT LACRIMAL .016X.025 (DRAIN) ×2 IMPLANT
STRIP CLOSURE SKIN 1/2X4 (GAUZE/BANDAGES/DRESSINGS) ×2 IMPLANT
SURGIFLO W/THROMBIN 8M KIT (HEMOSTASIS) ×4 IMPLANT
SUT ETHILON 6 0 9-3 1X18 BLK (SUTURE) ×2 IMPLANT
SUT ETHILON 6 0 P 1 (SUTURE) ×2 IMPLANT
SUT ETHILON 7 0 P 1 (SUTURE) IMPLANT
SUT PLAIN 5 0 P 3 18 (SUTURE) IMPLANT
SUT SILK 4 0 P 3 (SUTURE) ×2 IMPLANT
SUT SILK 6 0 BV 1XDISCX (SUTURE) IMPLANT
TOWEL OR 17X24 6PK STRL BLUE (TOWEL DISPOSABLE) ×4 IMPLANT
TUBE CONNECTING 12X1/4 (SUCTIONS) ×2 IMPLANT
WATER STERILE IRR 1000ML POUR (IV SOLUTION) ×2 IMPLANT

## 2017-09-03 NOTE — OR Nursing (Signed)
[place] lacrimal intubation set to lacrimal duct;left  REF: [id number] Exp  2021-11-23

## 2017-09-03 NOTE — Discharge Instructions (Addendum)
Please keep patch on for 24hrs. Do not get patch wet. Patch is OK to remove tomorrow morning.  You will have a medicated steri-strip covering your sutures. Please keep this intact until your post-operative appointment. It is OK to shower with this strip on- avoid rubbing the area. You will have a suture inside your nose.- Do not pick at it.  Start Maxitrol Ophthalmic Drops 2 times a day for 2 weeks in the operated eye.      General Anesthesia, Adult, Care After These instructions provide you with information about caring for yourself after your procedure. Your health care provider may also give you more specific instructions. Your treatment has been planned according to current medical practices, but problems sometimes occur. Call your health care provider if you have any problems or questions after your procedure. What can I expect after the procedure? After the procedure, it is common to have:  Vomiting.  A sore throat.  Mental slowness.  It is common to feel:  Nauseous.  Cold or shivery.  Sleepy.  Tired.  Sore or achy, even in parts of your body where you did not have surgery.  Follow these instructions at home: For at least 24 hours after the procedure:  Do not: ? Participate in activities where you could fall or become injured. ? Drive. ? Use heavy machinery. ? Drink alcohol. ? Take sleeping pills or medicines that cause drowsiness. ? Make important decisions or sign legal documents. ? Take care of children on your own.  Rest. Eating and drinking  If you vomit, drink water, juice, or soup when you can drink without vomiting.  Drink enough fluid to keep your urine clear or pale yellow.  Make sure you have little or no nausea before eating solid foods.  Follow the diet recommended by your health care provider. General instructions  Have a responsible adult stay with you until you are awake and alert.  Return to your normal activities as told by your  health care provider. Ask your health care provider what activities are safe for you.  Take over-the-counter and prescription medicines only as told by your health care provider.  If you smoke, do not smoke without supervision.  Keep all follow-up visits as told by your health care provider. This is important. Contact a health care provider if:  You continue to have nausea or vomiting at home, and medicines are not helpful.  You cannot drink fluids or start eating again.  You cannot urinate after 8-12 hours.  You develop a skin rash.  You have fever.  You have increasing redness at the site of your procedure. Get help right away if:  You have difficulty breathing.  You have chest pain.  You have unexpected bleeding.  You feel that you are having a life-threatening or urgent problem. This information is not intended to replace advice given to you by your health care provider. Make sure you discuss any questions you have with your health care provider. Document Released: 05/19/2000 Document Revised: 07/16/2015 Document Reviewed: 01/25/2015 Elsevier Interactive Patient Education  [employer].

## 2017-09-03 NOTE — Interval H&P Note (Signed)
History and Physical Interval Note:  09/03/2017 7:14 AM  Lindsey Middleton  has presented today for surgery, with the diagnosis of OBSTRUCTION OF LACRIMAL DUCT  The various methods of treatment have been discussed with the patient and family. After consideration of risks, benefits and other options for treatment, the patient has consented to  Procedure(s): LACRIMAL DUCT EXPLORATION (Left) ETHMOIDECTOMY (Left) as a surgical intervention .  The patient's history has been reviewed, patient examined, no change in status, stable for surgery.  I have reviewed the patient's chart and labs.  Questions were answered to the patient's satisfaction.     Clista Bernhardt

## 2017-09-03 NOTE — Anesthesia Preprocedure Evaluation (Signed)
Anesthesia Evaluation  Patient identified by MRN, date of birth, ID band Patient awake    Reviewed: Allergy & Precautions, NPO status , Patient's Chart, lab work & pertinent test results, reviewed documented beta blocker date and time   History of Anesthesia Complications (+) PONV and history of anesthetic complications  Airway Mallampati: I  TM Distance: >3 FB Neck ROM: Full    Dental  (+) Teeth Intact   Pulmonary neg pulmonary ROS,    breath sounds clear to auscultation       Cardiovascular hypertension, Pt. on medications and Pt. on home beta blockers (-) angina+ CAD, + Past MI, + Cardiac Stents and + Peripheral Vascular Disease   Rhythm:Regular     Neuro/Psych negative neurological ROS  negative psych ROS   GI/Hepatic Neg liver ROS, GERD  Controlled,  Endo/Other  negative endocrine ROS  Renal/GU negative Renal ROS     Musculoskeletal negative musculoskeletal ROS (+)   Abdominal   Peds  Hematology negative hematology ROS (+)   Anesthesia Other Findings   Reproductive/Obstetrics                             Anesthesia Physical Anesthesia Plan  ASA: II  Anesthesia Plan: General   Post-op Pain Management:    Induction: Intravenous  PONV Risk Score and Plan: 3 and Ondansetron, Dexamethasone, Propofol infusion, TIVA and Scopolamine patch - Pre-op  Airway Management Planned: Oral ETT and LMA  Additional Equipment: None  Intra-op Plan:   Post-operative Plan: Extubation in OR  Informed Consent: I have reviewed the patients History and Physical, chart, labs and discussed the procedure including the risks, benefits and alternatives for the proposed anesthesia with the patient or authorized representative who has indicated his/her understanding and acceptance.   Dental advisory given  Plan Discussed with: CRNA and Surgeon  Anesthesia Plan Comments:         Anesthesia Quick  Evaluation

## 2017-09-03 NOTE — Anesthesia Postprocedure Evaluation (Signed)
Anesthesia Post Note  Patient: Lindsey Middleton  Procedure(s) Performed: LACRIMAL DUCT EXPLORATION (Left Eye) ETHMOIDECTOMY (Left Eye)     Patient location during evaluation: PACU Anesthesia Type: General Level of consciousness: awake and alert Pain management: pain level controlled Vital Signs Assessment: post-procedure vital signs reviewed and stable Respiratory status: spontaneous breathing, nonlabored ventilation, respiratory function stable and patient connected to nasal cannula oxygen Cardiovascular status: blood pressure returned to baseline and stable Postop Assessment: no apparent nausea or vomiting Anesthetic complications: no    Last Vitals:  Vitals:   09/03/17 0900 09/03/17 0915  BP: (!) 154/86 (!) 149/84  Pulse: 74 71  Resp: 16 (!) 24  Temp:  (!) 36.2 C  SpO2: 100% 97%    Last Pain:  Vitals:   09/03/17 0552  TempSrc: Oral                 Shandora Koogler

## 2017-09-03 NOTE — Op Note (Signed)
Procedure(s): LACRIMAL DUCT EXPLORATION ETHMOIDECTOMY Procedure Note  Lindsey Middleton female 77 y.o. 09/03/2017  Procedure(s) and Anesthesia Type:    * LACRIMAL DUCT EXPLORATION - General    * ETHMOIDECTOMY - General  Surgeon(s) and Role:    * Clista Bernhardt, MD - Primary  Procedure Detail  LACRIMAL DUCT EXPLORATION, ETHMOIDECTOMY  Indications: The patient was admitted to the hospital with a brief history of left-sided nasolacrimal obstruction.  An intitial probing revealed chronic dacryocystitis. The patient now presents for dacryocystorhinostomy after discussing therapeutic alternatives.        Surgeon: Clista Bernhardt   Assistants: none Anesthesia: General endotracheal anesthesia and Local anesthesia 1% buffered lidocaine, with epinephrine, .75 bupivicaine  ASA Class: 3 Procedure Detail  Patient was brought to the room in supine position where the appropriate monitoring was put in place. Then the patient was prepped and draped in the usual standard fashion of plastic surgery. The right nasal cavity was packed with cottonoid's soaked in afrin.  Attention was turned to the area between the medial canthus and the nasal bridge which has been previously marked. A 15 blade was used to incise the area. Then blunt dissection was taken down to the level of the anterior lacrimal crest. Then the periosteal elevator was used to bluntly remove the periosteum and the lacrimal sac off of the lacrimal crest. The elevator was used to infracture below the lacrimal crest to create an opening into the nasal cavity. Then a kerrison rongeur was used to remove the bone approximately 31mm to create a window into the nasal cavity. The nasal mucosa was infiltrated with local block. Then an anterior ethmoidectomy took place. Antention was turned to the puncta where a 1 bowman probe was used to ensure the appropriate position of the nasolacrimal intubation tubes. A beaver blade was used to create an  opening into the nasal cavity through the nasal mucosa. Crawford tubes were then inserted through the upper and lower canalicular system and retrieved through the nasal cavity with a curved forcep. This was sutured to the lateral nasal mucosa with a 4-0 silk suture.  Hemostasis was maintained with monopolar and bipolar cautery.  Surgiflow was placed into the defect and then the incision was closed with 5-0 undyed vicryl and then the skin was closed in an interrupted fashion with 6-0 nylon. Tobradex ointment was placed over the wound and it was covered with a steri-strip. Then the right nose was covered with an eyepad.  The patient tolerated the procedure well and was transferred to the PACU In stable condition.   Findings: none  Estimated Blood Loss:  Minimal         Drains: crawford stent         Total IV Fluids: <2Ll  Blood Given: none          Specimens:none         Implants: none        Complications:  * No complications entered in OR log *         Disposition: PACU - hemodynamically stable.         Condition: stable

## 2017-09-03 NOTE — Transfer of Care (Signed)
Immediate Anesthesia Transfer of Care Note  Patient: Lindsey Middleton  Procedure(s) Performed: LACRIMAL DUCT EXPLORATION (Left Eye) ETHMOIDECTOMY (Left Eye)  Patient Location: PACU  Anesthesia Type:General  Level of Consciousness: drowsy  Airway & Oxygen Therapy: Patient Spontanous Breathing and Patient connected to nasal cannula oxygen  Post-op Assessment: Report given to RN, Post -op Vital signs reviewed and stable and Patient moving all extremities  Post vital signs: Reviewed and stable  Last Vitals:  Vitals Value Taken Time  BP 141/83 09/03/2017  8:33 AM  Temp    Pulse 77 09/03/2017  8:34 AM  Resp 27 09/03/2017  8:34 AM  SpO2 98 % 09/03/2017  8:34 AM  Vitals shown include unvalidated device data.  Last Pain:  Vitals:   09/03/17 0552  TempSrc: Oral         Complications: No apparent anesthesia complications

## 2017-09-03 NOTE — Brief Op Note (Signed)
09/03/2017  7:27 AM  PATIENT:  Lindsey Middleton  77 y.o. female  PRE-OPERATIVE DIAGNOSIS:  OBSTRUCTION OF LACRIMAL DUCT  POST-OPERATIVE DIAGNOSIS:  * No post-op diagnosis entered *  PROCEDURE:  Procedure(s): LACRIMAL DUCT EXPLORATION (Left) ETHMOIDECTOMY (Left)  SURGEON:  Surgeon(s) and Role:    * Abugo, Peyton Najjar, MD - Primary  PHYSICIAN ASSISTANT: none  ASSISTANTS: none   ANESTHESIA:   local and general  EBL:  minimal  BLOOD ADMINISTERED:none  DRAINS: crawford stent    LOCAL MEDICATIONS USED:  BUPIVICAINE  and LIDOCAINE   SPECIMEN:  No Specimen  DISPOSITION OF SPECIMEN:  N/A  COUNTS:  YES  TOURNIQUET:  * No tourniquets in log *  DICTATION: .Note written in EPIC  PLAN OF CARE: Discharge to home after PACU  PATIENT DISPOSITION:  PACU - hemodynamically stable.   Delay start of Pharmacological VTE agent (>24hrs) due to surgical blood loss or risk of bleeding: no

## 2017-09-03 NOTE — Anesthesia Procedure Notes (Signed)
Procedure Name: LMA Insertion Date/Time: 09/03/2017 7:37 AM Performed by: Moshe Salisbury, CRNA Pre-anesthesia Checklist: Patient identified, Emergency Drugs available, Suction available and Patient being monitored Patient Re-evaluated:Patient Re-evaluated prior to induction Oxygen Delivery Method: Circle System Utilized Preoxygenation: Pre-oxygenation with 100% oxygen Induction Type: IV induction Ventilation: Mask ventilation without difficulty LMA: LMA inserted LMA Size: 4.0 Number of attempts: 1 Placement Confirmation: positive ETCO2 Tube secured with: Tape Dental Injury: Teeth and Oropharynx as per pre-operative assessment

## 2017-09-04 ENCOUNTER — Encounter (HOSPITAL_COMMUNITY): Payer: Self-pay | Admitting: Oculoplastics Ophthalmology

## 2017-09-22 ENCOUNTER — Ambulatory Visit: Payer: Medicare Other

## 2017-09-22 NOTE — Progress Notes (Deleted)
Patient ID: Lindsey Middleton                 DOB: 1940-07-20                      MRN: [id number]     HPI: Lindsey Middleton is a 77 y.o. female referred by Dr. Oval Linsey to HTN clinic. PMH includes CAD s/p MI in 1986 and 2012, hypertension, hyperlipidemia, aortic aneurysm, and syncope.   **Repeat Lipid panel if interested [id number]**  Current HTN meds:  Carvedilol 12.5mg  twice daily Lisinopril 20mg  daily  Current LIPID meds: Ezetimibe 10mg  daily  Intolerance:  Atorvastatin 80mg  daily (03/2013 to 12/2016) Simvastatin 40mg  daily (04/2015) rosuvastatin 20mg  daily (02/09/2012)  BP goal: <130/80  LDL goal: <70 mg/dL  Family History: family history includes Cancer in her daughter and sister; Heart attack in her brother, brother, brother, father, mother, and sister; Heart disease in her brother, daughter, father, mother, and son; Hyperlipidemia in her daughter; Varicose Veins in her daughter.   Social History: reports that she has never smoked. She has never used smokeless tobacco. She reports that she does not drink alcohol or use drugs.  Diet:   Exercise:   Home BP readings:   Relevant Labs: 02/05/2017: CHO 277; TG 188; HDL 47; LDL-c 192 (ezetimibe 10mg )  Wt Readings from Last 3 Encounters:  09/03/17 170 lb (77.1 kg)  08/18/17 170 lb (77.1 kg)  04/11/16 176 lb (79.8 kg)   BP Readings from Last 3 Encounters:  09/03/17 (!) 149/84  08/18/17 (!) 142/88  01/09/17 125/78   Pulse Readings from Last 3 Encounters:  09/03/17 71  08/18/17 83  01/09/17 74    Past Medical History:  Diagnosis Date  . Ascending aortic aneurysm (HCC)    4.2 cm by CT 08/2015.  Repeat in 1 year.  . Asthma   . Coronary artery disease    s/p Rotablator to mid RCA in 2/12  . Dyslipidemia   . Fatigue   . GERD (gastroesophageal reflux disease)   . Heart attack ([place])    1986  . Hypertension   . PONV (postoperative nausea and vomiting)   . Rectal polyp    hyplastic  . Varicose vein   . Weakness      Current Outpatient Medications on File Prior to Visit  Medication Sig Dispense Refill  . ALPRAZolam (XANAX) 0.25 MG tablet Take 0.25 mg by mouth 2 (two) times daily as needed for anxiety.    Marland Kitchen azelastine (ASTELIN) 0.1 % nasal spray Place 1 spray into both nostrils daily. Use in each nostril as directed    . carvedilol (COREG) 12.5 MG tablet Take 1 tablet (12.5 mg total) by mouth 2 (two) times daily. 180 tablet 3  . ezetimibe (ZETIA) 10 MG tablet Take 1 tablet (10 mg total) daily by mouth. 90 tablet 3  . furosemide (LASIX) 20 MG tablet 1 TABLET BY MOUTH ONCE A WEEK (Patient taking differently: Take 20 mg by mouth once a week. 1 TABLET BY MOUTH ONCE A WEEK ON SATURDAYS) 12 tablet 3  . levocetirizine (XYZAL) 5 MG tablet Take 5 mg by mouth daily as needed for allergies.    Marland Kitchen lisinopril (PRINIVIL,ZESTRIL) 20 MG tablet Take 1 tablet (20 mg total) by mouth daily. 90 tablet 3  . nitroGLYCERIN (NITROSTAT) 0.4 MG SL tablet Place 1 tablet (0.4 mg total) under the tongue every 5 (five) minutes as needed. For chest pain 25 tablet 3   No current  facility-administered medications on file prior to visit.     Allergies  Allergen Reactions  . Morphine Anaphylaxis  . Phenergan [Promethazine Hcl] Swelling  . Imdur [Isosorbide Dinitrate] Other (See Comments)    hypotension    There were no vitals taken for this visit.  No problem-specific Assessment & Plan notes found for this encounter.  Vasilisa Vore Rodriguez-Guzman PharmD, BCPS, [place] [id number] 09/22/2017 11:32 AM

## 2017-10-22 ENCOUNTER — Ambulatory Visit (INDEPENDENT_AMBULATORY_CARE_PROVIDER_SITE_OTHER): Payer: Medicare Other | Admitting: Pharmacist Clinician (PhC)/ Clinical Pharmacy Specialist

## 2017-10-22 ENCOUNTER — Encounter: Payer: Self-pay | Admitting: Pharmacist Clinician (PhC)/ Clinical Pharmacy Specialist

## 2017-10-22 VITALS — BP 166/108 | HR 72

## 2017-10-22 DIAGNOSIS — E785 Hyperlipidemia, unspecified: Secondary | ICD-10-CM | POA: Diagnosis not present

## 2017-10-22 DIAGNOSIS — I10 Essential (primary) hypertension: Secondary | ICD-10-CM | POA: Diagnosis not present

## 2017-10-22 DIAGNOSIS — R079 Chest pain, unspecified: Secondary | ICD-10-CM

## 2017-10-22 MED ORDER — NITROGLYCERIN 0.4 MG SL SUBL: 0.4000 mg | SUBLINGUAL_TABLET | SUBLINGUAL | 3 refills | Status: DC | PRN

## 2017-10-22 MED ORDER — LISINOPRIL 20 MG PO TABS: 20.0000 mg | ORAL_TABLET | Freq: Every day | ORAL | 3 refills | Status: DC

## 2017-10-22 NOTE — Assessment & Plan Note (Signed)
Patient with essential hypertension, for some reason no longer taking lisinopril.   Will have her restart lisinopril 20 mg once daily and repeat BMET in 2 weeks.  Patient will have done at PCP, as she lives in [place].  We will see her back in the clinic in one month for follow up.

## 2017-10-22 NOTE — Progress Notes (Signed)
10/22/2017 Lindsey Middleton 05/27/1940 [id number]   HPI:  Lindsey Middleton is a 77 y.o. female patient of Dr Oval Linsey, with a [place] below who presents today for hypertension clinic evaluation.  Her medical history is significant for prior MI's (1986 and 2012), s/p DES to RCA, ascending aortic aneurysm, asthma, GERD and varicose veins.  Apparently her BP has been hard to control for several years.  She was seen in [facility] in 2018, but was lost to follow up due to surgical procedures.      Today patient notes that she has not been taking lisinopril for some time.  Her husband takes it daily, but she cannot recall when or why hers was stopped.  She also notes some occasional chest pains, and used her husbands sublingual nitroglycerine with good relief.    Blood Pressure Goal:  130/80  Current Medications:  Carvedilol 12.5 mg bid  Furosemide 20 mg once weekly  Family Hx:  MI in both parents, 3 brothers (all before age 25) and 1 sister (around 22-39 yrs)  2 of 3 daughters have increased lipids  Social Hx:  No tobacco or alcohol; coffee 2-3 per day; no other caffeine  Diet:  Eats out some, popcorn for supper sometimes (bag); no added salt otherwise;  No red meat; lots of chicken, lots of pasta; some frozen some canned  Exercise:  Was walking at the mall previously, not much anymore, as husband is not able  Home BP readings:  Still running "a little high" at home, up to 342'A systolic, lowest at 768.  Did not bring her readings with her  Intolerances:   Isosorbide mono caused her to become hypotensive  Labs:  08/2017:  Na 143, K 4.8, Glu 98, BUN 13, SCr 0.71  01/2017:  TC 277, TG 188, HDL 47, LDL 192  Wt Readings from Last 3 Encounters:  09/03/17 170 lb (77.1 kg)  08/18/17 170 lb (77.1 kg)  04/11/16 176 lb (79.8 kg)   BP Readings from Last 3 Encounters:  10/22/17 (!) 166/108  09/03/17 (!) 149/84  08/18/17 (!) 142/88   Pulse Readings from Last 3 Encounters:  10/22/17 72    09/03/17 71  08/18/17 83    Current Outpatient Medications  Medication Sig Dispense Refill  . ALPRAZolam (XANAX) 0.25 MG tablet Take 0.25 mg by mouth 2 (two) times daily as needed for anxiety.    Marland Kitchen azelastine (ASTELIN) 0.1 % nasal spray Place 1 spray into both nostrils daily. Use in each nostril as directed    . carvedilol (COREG) 12.5 MG tablet Take 1 tablet (12.5 mg total) by mouth 2 (two) times daily. 180 tablet 3  . ezetimibe (ZETIA) 10 MG tablet Take 1 tablet (10 mg total) daily by mouth. 90 tablet 3  . furosemide (LASIX) 20 MG tablet 1 TABLET BY MOUTH ONCE A WEEK (Patient taking differently: Take 20 mg by mouth once a week. 1 TABLET BY MOUTH ONCE A WEEK ON SATURDAYS) 12 tablet 3  . levocetirizine (XYZAL) 5 MG tablet Take 5 mg by mouth daily as needed for allergies.    Marland Kitchen lisinopril (PRINIVIL,ZESTRIL) 20 MG tablet Take 1 tablet (20 mg total) by mouth daily. 30 tablet 3  . nitroGLYCERIN (NITROSTAT) 0.4 MG SL tablet Place 1 tablet (0.4 mg total) under the tongue every 5 (five) minutes as needed. For chest pain 25 tablet 3   No current facility-administered medications for this visit.     Allergies  Allergen Reactions  .  Morphine Anaphylaxis  . Phenergan [Promethazine Hcl] Swelling  . Imdur [Isosorbide Dinitrate] Other (See Comments)    hypotension    Past Medical History:  Diagnosis Date  . Ascending aortic aneurysm (HCC)    4.2 cm by CT 08/2015.  Repeat in 1 year.  . Asthma   . Coronary artery disease    s/p Rotablator to mid RCA in 2/12  . Dyslipidemia   . Fatigue   . GERD (gastroesophageal reflux disease)   . Heart attack ([place])    1986  . Hypertension   . PONV (postoperative nausea and vomiting)   . Rectal polyp    hyplastic  . Varicose vein   . Weakness     Blood pressure (!) 166/108, pulse 72.  Hypertension Patient with essential hypertension, for some reason no longer taking lisinopril.   Will have her restart lisinopril 20 mg once daily and repeat BMET in 2  weeks.  Patient will have done at PCP, as she lives in [place].  We will see her back in the clinic in one month for follow up.  Hyperlipidemia Patient with familial hyperlipidemia, LDL in December was 192.  Will get current labs from PCP in [place]. Once we receive these, will submit request for Repatha Pushtron[employer].  Patient is returning in one month for BP f/u, if Repatha approved at that time, will have her do first dose in the office for teaching purpose.    Tommy Medal PharmD CPP [facility] [address] [place], [place] [address] [phone]

## 2017-10-22 NOTE — Assessment & Plan Note (Signed)
Patient with familial hyperlipidemia, LDL in December was 192.  Will get current labs from PCP in [place]. Once we receive these, will submit request for Repatha Pushtronix.  Patient is returning in one month for BP f/u, if Repatha approved at that time, will have her do first dose in the office for teaching purpose.

## 2017-10-22 NOTE — Patient Instructions (Addendum)
Return for a a follow up appointment in 6 weeks  Go to the lab in 2 weeks to check kidney function  Your blood pressure today is 166/108  Check your blood pressure at home daily and keep record of the readings.  Take your BP meds as follows:  Start lisinopril 20 mg once daily.  Continue with all other medications  Bring all of your meds, your BP cuff and your record of home blood pressures to your next appointment.  Exercise as you're able, try to walk approximately 30 minutes per day.  Keep salt intake to a minimum, especially watch canned and prepared boxed foods.  Eat more fresh fruits and vegetables and fewer canned items.  Avoid eating in fast food restaurants.    HOW TO TAKE YOUR BLOOD PRESSURE: . Rest 5 minutes before taking your blood pressure. .  Don't smoke or drink caffeinated beverages for at least 30 minutes before. . Take your blood pressure before (not after) you eat. . Sit comfortably with your back supported and both feet on the floor (don't cross your legs). . Elevate your arm to heart level on a table or a desk. . Use the proper sized cuff. It should fit smoothly and snugly around your bare upper arm. There should be enough room to slip a fingertip under the cuff. The bottom edge of the cuff should be 1 inch above the crease of the elbow. . Ideally, take 3 measurements at one sitting and record the average.

## 2017-11-16 ENCOUNTER — Telehealth: Payer: Self-pay | Admitting: Pharmacist Clinician (PhC)/ Clinical Pharmacy Specialist

## 2017-11-16 MED ORDER — ALIROCUMAB 75 MG/ML ~~LOC~~ SOPN: 75.0000 mg | PEN_INJECTOR | SUBCUTANEOUS | 12 refills | Status: DC

## 2017-11-16 NOTE — Telephone Encounter (Signed)
Praluent approved until further notice ?

## 2017-11-18 LAB — BASIC METABOLIC PANEL
BUN/Creatinine Ratio: 17 (ref 12–28)
BUN: 11 mg/dL (ref 8–27)
CALCIUM: 9.8 mg/dL (ref 8.7–10.3)
CO2: 24 mmol/L (ref 20–29)
CREATININE: 0.63 mg/dL (ref 0.57–1.00)
Chloride: 102 mmol/L (ref 96–106)
GFR, EST AFRICAN AMERICAN: 100 mL/min/{1.73_m2} (ref >59)
GFR, EST NON AFRICAN AMERICAN: 87 mL/min/{1.73_m2} (ref >59)
Glucose: 96 mg/dL (ref 65–99)
Potassium: 4.6 mmol/L (ref 3.5–5.2)
Sodium: 139 mmol/L (ref 134–144)

## 2017-11-19 ENCOUNTER — Ambulatory Visit: Payer: Medicare Other

## 2017-12-02 ENCOUNTER — Other Ambulatory Visit: Payer: Self-pay | Admitting: Pharmacist Clinician (PhC)/ Clinical Pharmacy Specialist

## 2017-12-02 MED ORDER — ALIROCUMAB 75 MG/ML ~~LOC~~ SOPN: 75.0000 mg | PEN_INJECTOR | SUBCUTANEOUS | 12 refills | Status: DC

## 2017-12-15 ENCOUNTER — Ambulatory Visit: Payer: Medicare Other

## 2017-12-17 ENCOUNTER — Encounter: Payer: Self-pay | Admitting: Cardiovascular Disease

## 2017-12-17 ENCOUNTER — Ambulatory Visit (INDEPENDENT_AMBULATORY_CARE_PROVIDER_SITE_OTHER): Payer: Medicare Other | Admitting: Cardiovascular Disease

## 2017-12-17 VITALS — BP 128/81 | HR 80 | Ht 66.0 in | Wt 175.4 lb

## 2017-12-17 DIAGNOSIS — E785 Hyperlipidemia, unspecified: Secondary | ICD-10-CM | POA: Diagnosis not present

## 2017-12-17 DIAGNOSIS — I10 Essential (primary) hypertension: Secondary | ICD-10-CM

## 2017-12-17 DIAGNOSIS — Z5181 Encounter for therapeutic drug level monitoring: Secondary | ICD-10-CM

## 2017-12-17 DIAGNOSIS — I2510 Atherosclerotic heart disease of native coronary artery without angina pectoris: Secondary | ICD-10-CM

## 2017-12-17 DIAGNOSIS — I77810 Thoracic aortic ectasia: Secondary | ICD-10-CM

## 2017-12-17 MED ORDER — LISINOPRIL 40 MG PO TABS: 40.0000 mg | ORAL_TABLET | Freq: Every day | ORAL | 3 refills | Status: DC

## 2017-12-17 NOTE — Patient Instructions (Addendum)
Medication Instructions:  INCREASE YOUR LISINOPRIL TO 40 MG DAILY  If you need a refill on your cardiac medications before your next appointment, please call your pharmacy.   Lab work: FASTING LP/CMET PRIOR TO YOUR FOLLOW UP APPOINTMENT   Testing/Procedures: NONE  Follow-Up: 01/26/18 WITH DR [facility]

## 2017-12-17 NOTE — Progress Notes (Signed)
Cardiology Office Note   Date:  12/17/2017   ID:  Lindsey Middleton, DOB [birth year], MRN [id number]  PCP:  Ephriam Jenkins E  Cardiologist:   Skeet Latch, MD   No chief complaint on file.   History of Present Illness: Lindsey Middleton is a 77 y.o. female with CAD, hypertension, hyperlipidemia, mild ascending aortic aneurysm, and syncope who presents for follow up.  Lindsey Middleton was previously a patient of [name].  She had an MI in 1986 and in 2012 that was treated with DES to the mid RCA.  She had a cath in 2013 at which time the stent was patent and she had non-obstructive CAD.  She had a [employer] 05/2015 as a pre-op clearance and it was low risk for ischemia.  She last saw [name] 07/2015.  At that appointment her blood pressure was not well-controlled so lisinopril was increased to 20 mg daily.  Her blood pressure was still poorly controlled on 09/14/15.  Imdur was added to her regimen due to chest discomfort and poorly controlled hypertension. She was also noted to be tachycardic and reported increased shortness of breath as well as lower extremity edema. Therefore, she was referred for lower extremity Dopplers that were negative for DVT.  She had a chest CT-A on 09/17/15 that was negative for pulmonary embolism. It showed moderate coronary artery calcifications and a mild ascending aortic aneurysm.  Her lipids have been poorly-controlled.  Her LDL has been over 170 on atorvastatin 80 mg daily so Zetia was added to her regimen.  Her lipids remained elevated so she was referred to the lipid clinic for consideration of a PCSK9 inhibitor.  She was referred for PCSK9 inhibitor but it was denied by insurance because of her age.    Lindsey Middleton has been feeling well.  Her only complaint is her left eyelid running all the time.  She is scheduled to see her ophthalmologist again soon.  She has mild, chronic lower extremity edema in the left ankle ever since she had surgery.  She otherwise has  no edema, orthopnea, or PND.  She has not been feeling lightheaded or dizzy.  She has no chest pain or shortness of breath.  She has not been exercising much lately.  At her last appointment lisinopril was added.  She brings a log of her BP showing that it has ranged from the 110s-160s.  On average it is in the 140s.  Since her last appointment she had and echo to followup on her ascending aorta aneurysm.  It was stable at 4.3 cm.  LVEF was 55-60 with grade 1 diastolic dysfunction.   Past Medical History:  Diagnosis Date  . Ascending aortic aneurysm (HCC)    4.2 cm by CT 08/2015.  Repeat in 1 year.  . Asthma   . Coronary artery disease    s/p Rotablator to mid RCA in 2/12  . Dyslipidemia   . Fatigue   . GERD (gastroesophageal reflux disease)   . Heart attack ([place])    1986  . Hypertension   . PONV (postoperative nausea and vomiting)   . Rectal polyp    hyplastic  . Varicose vein   . Weakness     Past Surgical History:  Procedure Laterality Date  . ABDOMINAL ANGIOGRAM  02/12/2012   Procedure: ABDOMINAL ANGIOGRAM;  Surgeon: Peter M Martinique, MD;  Location: [facility] CATH LAB;  Service: Cardiovascular;;  . ABDOMINAL HYSTERECTOMY  1996   total w/BSO  .  CATARACT EXTRACTION     june 2016, july 2016  . CORONARY ANGIOPLASTY  2/12   Rotablator to the RCA  . CORONARY STENT PLACEMENT    . ETHMOIDECTOMY Left 09/03/2017   Procedure: ETHMOIDECTOMY;  Surgeon: Clista Bernhardt, MD;  Location: [place];  Service: Ophthalmology;  Laterality: Left;  . HARDWARE REMOVAL  02/21/2011   Procedure: HARDWARE REMOVAL;  Surgeon: Laurice Record Aplington;  Location: [place];  Service: Orthopedics;  Laterality: Left;  REMOVAL TWO SCREWS DISTAL LEFT TIBIA   . LACRIMAL DUCT EXPLORATION Left 09/03/2017   Procedure: LACRIMAL DUCT EXPLORATION;  Surgeon: Clista Bernhardt, MD;  Location: [place];  Service: Ophthalmology;  Laterality: Left;  . LEFT HEART CATHETERIZATION WITH CORONARY ANGIOGRAM N/A 02/12/2012    Procedure: LEFT HEART CATHETERIZATION WITH CORONARY ANGIOGRAM;  Surgeon: Peter M Martinique, MD;  Location: [facility] CATH LAB;  Service: Cardiovascular;  Laterality: N/A;  . LEG SURGERY Left    rod, pins, screws   . ORIF TIBIA & FIBULA FRACTURES  2000  . polyp removal    . SPINE SURGERY     2015     Current Outpatient Medications  Medication Sig Dispense Refill  . Alirocumab (PRALUENT) 75 MG/ML SOPN Inject 75 mg into the skin every 14 (fourteen) days. 2 pen 12  . ALPRAZolam (XANAX) 0.25 MG tablet Take 0.25 mg by mouth 2 (two) times daily as needed for anxiety.    Marland Kitchen azelastine (ASTELIN) 0.1 % nasal spray Place 1 spray into both nostrils daily. Use in each nostril as directed    . carvedilol (COREG) 12.5 MG tablet Take 1 tablet (12.5 mg total) by mouth 2 (two) times daily. 180 tablet 3  . ezetimibe (ZETIA) 10 MG tablet Take 1 tablet (10 mg total) daily by mouth. 90 tablet 3  . furosemide (LASIX) 20 MG tablet 1 TABLET BY MOUTH ONCE A WEEK (Patient taking differently: Take 20 mg by mouth once a week. 1 TABLET BY MOUTH ONCE A WEEK ON SATURDAYS) 12 tablet 3  . levocetirizine (XYZAL) 5 MG tablet Take 5 mg by mouth daily as needed for allergies.    Marland Kitchen lisinopril (PRINIVIL,ZESTRIL) 40 MG tablet Take 1 tablet (40 mg total) by mouth daily. 90 tablet 3  . nitroGLYCERIN (NITROSTAT) 0.4 MG SL tablet Place 1 tablet (0.4 mg total) under the tongue every 5 (five) minutes as needed. For chest pain 25 tablet 3   No current facility-administered medications for this visit.     Allergies:   Morphine; Phenergan [promethazine hcl]; and Imdur [isosorbide dinitrate]    Social History:  The patient  reports that she has never smoked. She has never used smokeless tobacco. She reports that she does not drink alcohol or use drugs.   Family History:  The patient's family history includes Cancer in her daughter and sister; Heart attack in her brother, brother, brother, father, mother, and sister; Heart disease in her brother,  daughter, father, mother, and son; Hyperlipidemia in her daughter; Varicose Veins in her daughter.    ROS:  Please see the history of present illness.   Otherwise, review of systems are positive for none.   All other systems are reviewed and negative.    PHYSICAL EXAM: VS:  BP 128/81   Pulse 80   Ht 5\' 6"  (1.676 m)   Wt 175 lb 6.4 oz (79.6 kg)   BMI 28.31 kg/m  , BMI Body mass index is 28.31 kg/m. GENERAL:  Well appearing HEENT: Pupils equal round and  reactive, fundi not visualized, oral mucosa unremarkable NECK:  No jugular venous distention, waveform within normal limits, carotid upstroke brisk and symmetric, no bruits LUNGS:  Clear to auscultation bilaterally HEART:  RRR.  PMI not displaced or sustained,S1 and S2 within normal limits, no S3, no S4, no clicks, no rubs, no murmurs ABD:  Flat, positive bowel sounds normal in frequency in pitch, no bruits, no rebound, no guarding, no midline pulsatile mass, no hepatomegaly, no splenomegaly EXT:  2 plus pulses throughout, no edema, no cyanosis no clubbing SKIN:  No rashes no nodules NEURO:  Cranial nerves II through XII grossly intact, motor grossly intact throughout PSYCH:  Cognitively intact, oriented to person place and time   EKG:  EKG is not ordered today. The ekg ordered 04/11/16 demonstrates sinus bradycardia rate 76 bpm.  LAFB.  Absent R wave progression.  01/09/17: Sinus rhythm.  Rate 80 bpm.  LAFB.  Anterior TWI  08/18/17: Sinus rhythm.  Rate 83 bpm.  R axis deviation.    Lexiscan Myoview 05/28/15:  Nuclear stress EF: 60%.  No T wave inversion was noted during stress.  There was no ST segment deviation noted during stress.  Defect 1: There is a small defect of mild severity.  This is a low risk study.  Small, mild fixed inferolateral defect, likely artifact. No reversible ischemia. LVEF 60% with normal wall motion. This is a low risk study.  Echo 02/06/12: LVEF 55-60%.  Mild mitral regurgitation.    Recent  Labs: 09/03/2017: Hemoglobin 13.2; Platelets 209 11/17/2017: BUN 11; Creatinine, Ser 0.63; Potassium 4.6; Sodium 139    Lipid Panel    Component Value Date/Time   CHOL 335 (H) 05/30/2016 0900   TRIG 135 05/30/2016 0900   HDL 54 05/30/2016 0900   CHOLHDL 6.2 (H) 05/30/2016 0900   VLDL 27 05/30/2016 0900   LDLCALC 254 (H) 05/30/2016 0900   LDLDIRECT 215.3 04/05/2013 1242   08/06/16: Sodium 141, potassium 4.7, BUN 16, creatinine 0.61 Total cholesterol 282, triglycerides 143, HDL 54, LDL 199 AST 17, ALT 24   Wt Readings from Last 3 Encounters:  12/17/17 175 lb 6.4 oz (79.6 kg)  09/03/17 170 lb (77.1 kg)  08/18/17 170 lb (77.1 kg)      ASSESSMENT AND PLAN:  # CAD s/p MI and RCA PCI:  Stable.  She had a stress test that was negative for ischemia 05/2015. Continue aspirin, Zetia.  Check lipids/CMP 01/2018.  We will check on Repatha.  She should be eligible.   # Shortness of breath:  # Tachycardia:  Stable.  Continue carvedilol.  # Hyperlipidemia:  Lindsey Middleton cholesterol is >100 despite being on atorvastatin 80 mg daily and Zetia.  She didn't tolerate atorvastatin.  She is willing to try Repatha but she reports it was denied due to age.  We will follow up.    # Hypertension: Blood pressure is controlled today but has been elevated at home.    # Ascending aorta aneurysm: 4.2 cm on CT 08/2015.   4.3 cm on echo 08/28/17.  Continue carvedilol.  Current medicines are reviewed at length with the patient today.  The patient does not have concerns regarding medicines.  The following changes have been made: none  Labs/ tests ordered today include:   Orders Placed This Encounter  Procedures  . Lipid panel  . Comprehensive metabolic panel     Disposition:   FU with Ambrielle Kington C. Oval Linsey, MD, [facility] in 6 months.      Signed, Kyal Arts C.  Oval Linsey, MD, [facility]  12/17/2017 5:36 PM    [facility]

## 2018-01-12 LAB — COMPREHENSIVE METABOLIC PANEL
ALBUMIN: 4.6 g/dL (ref 3.5–4.8)
ALT: 12 IU/L (ref 0–32)
AST: 15 IU/L (ref 0–40)
Albumin/Globulin Ratio: 2.3 — ABNORMAL HIGH (ref 1.2–2.2)
Alkaline Phosphatase: 68 IU/L (ref 39–117)
BUN / CREAT RATIO: 22 (ref 12–28)
BUN: 14 mg/dL (ref 8–27)
Bilirubin Total: 0.3 mg/dL (ref 0.0–1.2)
CALCIUM: 10 mg/dL (ref 8.7–10.3)
CO2: 24 mmol/L (ref 20–29)
CREATININE: 0.64 mg/dL (ref 0.57–1.00)
Chloride: 103 mmol/L (ref 96–106)
GFR calc Af Amer: 100 mL/min/{1.73_m2} (ref >59)
GFR, EST NON AFRICAN AMERICAN: 86 mL/min/{1.73_m2} (ref >59)
GLOBULIN, TOTAL: 2 g/dL (ref 1.5–4.5)
Glucose: 98 mg/dL (ref 65–99)
Potassium: 4.7 mmol/L (ref 3.5–5.2)
Sodium: 142 mmol/L (ref 134–144)
TOTAL PROTEIN: 6.6 g/dL (ref 6.0–8.5)

## 2018-01-12 LAB — LIPID PANEL
CHOLESTEROL TOTAL: 295 mg/dL — AB (ref 100–199)
Chol/HDL Ratio: 5.4 ratio — ABNORMAL HIGH (ref 0.0–4.4)
HDL: 55 mg/dL (ref >39)
LDL Calculated: 209 mg/dL — ABNORMAL HIGH (ref 0–99)
TRIGLYCERIDES: 154 mg/dL — AB (ref 0–149)
VLDL Cholesterol Cal: 31 mg/dL (ref 5–40)

## 2018-01-15 ENCOUNTER — Other Ambulatory Visit: Payer: Self-pay | Admitting: Cardiovascular Disease

## 2018-01-26 ENCOUNTER — Encounter: Payer: Self-pay | Admitting: Cardiovascular Disease

## 2018-01-26 ENCOUNTER — Ambulatory Visit (INDEPENDENT_AMBULATORY_CARE_PROVIDER_SITE_OTHER): Payer: Medicare Other | Admitting: Cardiovascular Disease

## 2018-01-26 ENCOUNTER — Telehealth: Payer: Self-pay | Admitting: Cardiovascular Disease

## 2018-01-26 VITALS — BP 127/88 | HR 94 | Ht 66.0 in | Wt 174.0 lb

## 2018-01-26 DIAGNOSIS — I2510 Atherosclerotic heart disease of native coronary artery without angina pectoris: Secondary | ICD-10-CM

## 2018-01-26 DIAGNOSIS — E785 Hyperlipidemia, unspecified: Secondary | ICD-10-CM

## 2018-01-26 DIAGNOSIS — R079 Chest pain, unspecified: Secondary | ICD-10-CM | POA: Diagnosis not present

## 2018-01-26 DIAGNOSIS — I10 Essential (primary) hypertension: Secondary | ICD-10-CM

## 2018-01-26 DIAGNOSIS — I77810 Thoracic aortic ectasia: Secondary | ICD-10-CM

## 2018-01-26 NOTE — Patient Instructions (Signed)
Medication Instructions:  Your physician recommends that you continue on your current medications as directed. Please refer to the Current Medication list given to you today.  If you need a refill on your cardiac medications before your next appointment, please call your pharmacy.   Lab work: NONE  Testing/Procedures: Your physician has requested that you have a lexiscan myoview. For further information please visit [email or web address]. Please follow instruction sheet, as given.  Follow-Up: At [facility], you and your health needs are our priority.  As part of our continuing mission to provide you with exceptional heart care, we have created designated Provider Care Teams.  These Care Teams include your primary Cardiologist (physician) and Advanced Practice Providers (APPs -  Physician Assistants and Nurse Practitioners) who all work together to provide you with the care you need, when you need it. You will need a follow up appointment in 5 months.  Please call our office 2 months in advance to schedule this appointment.  You may see DR [facility] or one of the following Advanced Practice Providers on your designated Care Team:   Kerin Ransom, PA-C Roby Lofts, [place] . Sande Rives, PA-C  Cardiac Nuclear Scan A cardiac nuclear scan is a test that measures blood flow to the heart when a person is resting and when he or she is exercising. The test looks for problems such as:  Not enough blood reaching a portion of the heart.  The heart muscle not working normally.  You may need this test if:  You have heart disease.  You have had abnormal lab results.  You have had heart surgery or angioplasty.  You have chest pain.  You have shortness of breath.  In this test, a radioactive dye (tracer) is injected into your bloodstream. After the tracer has traveled to your heart, an imaging device is used to measure how much of the tracer is absorbed by or distributed to various areas of  your heart. This procedure is usually done at a hospital and takes 2-4 hours. Tell a health care provider about:  Any allergies you have.  All medicines you are taking, including vitamins, herbs, eye drops, creams, and over-the-counter medicines.  Any problems you or family members have had with the use of anesthetic medicines.  Any blood disorders you have.  Any surgeries you have had.  Any medical conditions you have.  Whether you are pregnant or may be pregnant. What are the risks? Generally, this is a safe procedure. However, problems may occur, including:  Serious chest pain and heart attack. This is only a risk if the stress portion of the test is done.  Rapid heartbeat.  Sensation of warmth in your chest. This usually passes quickly.  What happens before the procedure?  Ask your health care provider about changing or stopping your regular medicines. This is especially important if you are taking diabetes medicines or blood thinners.  Remove your jewelry on the day of the procedure. What happens during the procedure?  An IV tube will be inserted into one of your veins.  Your health care provider will inject a small amount of radioactive tracer through the tube.  You will wait for 20-40 minutes while the tracer travels through your bloodstream.  Your heart activity will be monitored with an electrocardiogram (ECG).  You will lie down on an exam table.  Images of your heart will be taken for about 15-20 minutes.  You may be asked to exercise on a treadmill or stationary bike. While  you exercise, your heart's activity will be monitored with an ECG, and your blood pressure will be checked. If you are unable to exercise, you may be given a medicine to increase blood flow to parts of your heart.  When blood flow to your heart has peaked, a tracer will again be injected through the IV tube.  After 20-40 minutes, you will get back on the exam table and have more images  taken of your heart.  When the procedure is over, your IV tube will be removed. The procedure may vary among health care providers and hospitals. Depending on the type of tracer used, scans may need to be repeated 3-4 hours later. What happens after the procedure?  Unless your health care provider tells you otherwise, you may return to your normal schedule, including diet, activities, and medicines.  Unless your health care provider tells you otherwise, you may increase your fluid intake. This will help flush the contrast dye from your body. Drink enough fluid to keep your urine clear or pale yellow.  It is up to you to get your test results. Ask your health care provider, or the department that is doing the test, when your results will be ready. Summary  A cardiac nuclear scan measures the blood flow to the heart when a person is resting and when he or she is exercising.  You may need this test if you are at risk for heart disease.  Tell your health care provider if you are pregnant.  Unless your health care provider tells you otherwise, increase your fluid intake. This will help flush the contrast dye from your body. Drink enough fluid to keep your urine clear or pale yellow. This information is not intended to replace advice given to you by your health care provider. Make sure you discuss any questions you have with your health care provider. Document Released: 03/07/2004 Document Revised: 02/13/2016 Document Reviewed: 01/19/2013 Elsevier Interactive Patient Education  2017 [employer].

## 2018-01-26 NOTE — Progress Notes (Signed)
Cardiology Office Note   Date:  01/26/2018   ID:  Lindsey Middleton, DOB Dec 21, 1940, MRN [id number]  PCP:  Ephriam Jenkins E  Cardiologist:   Skeet Latch, MD   No chief complaint on file.   History of Present Illness: Lindsey Middleton is a 77 y.o. female with CAD, hypertension, hyperlipidemia, mild ascending aortic aneurysm, and syncope who presents for follow up.  [name] was previously a patient of [name].  She had an MI in 1986 and in 2012 that was treated with DES to the mid RCA.  She had a cath in 2013 at which time the stent was patent and she had non-obstructive CAD.  She had a [employer] 05/2015 as a pre-op clearance and it was low risk for ischemia.  She last saw [name] 07/2015.  At that appointment her blood pressure was not well-controlled so lisinopril was increased to 20 mg daily.  Her blood pressure was still poorly controlled on 09/14/15.  Imdur was added to her regimen due to chest discomfort and poorly controlled hypertension. She was also noted to be tachycardic and reported increased shortness of breath as well as lower extremity edema. Therefore, she was referred for lower extremity Dopplers that were negative for DVT.  She had a chest CT-A on 09/17/15 that was negative for pulmonary embolism. It showed moderate coronary artery calcifications and a mild ascending aortic aneurysm.  Her lipids have been poorly-controlled.  Her LDL has been over 170 on atorvastatin 80 mg daily so Zetia was added to her regimen.  Her lipids remained elevated so she was referred to the lipid clinic for consideration of a PCSK9 inhibitor.  She was referred for PCSK9 inhibitor but it was denied by insurance because of her age.  She was subsequently approved for the patient assistance program.  [name] had an echo 08/2017 to followup on her ascending aorta aneurysm.  It was stable at 4.3 cm.  LVEF was 55-60% with grade 1 diastolic dysfunction.  Since her last appointment she has been  feeling well.  She reports a couple episodes of chest tightness that radiates into her left arm and jaw.  She has noticed this when riding in the car.  She denies any exertional chest pain, though she is not getting any exercise.  She is more concerned about her husband and would prefer to wait for stress testing until after he has a sleep study.  She has no lower extremity edema, orthopnea, or PND.   Past Medical History:  Diagnosis Date  . Ascending aortic aneurysm (HCC)    4.2 cm by CT 08/2015.  Repeat in 1 year.  . Asthma   . Coronary artery disease    s/p Rotablator to mid RCA in 2/12  . Dyslipidemia   . Fatigue   . GERD (gastroesophageal reflux disease)   . Heart attack ([place])    1986  . Hypertension   . PONV (postoperative nausea and vomiting)   . Rectal polyp    hyplastic  . Varicose vein   . Weakness     Past Surgical History:  Procedure Laterality Date  . ABDOMINAL ANGIOGRAM  02/12/2012   Procedure: ABDOMINAL ANGIOGRAM;  Surgeon: Peter M Martinique, MD;  Location: [facility] CATH LAB;  Service: Cardiovascular;;  . ABDOMINAL HYSTERECTOMY  1996   total w/BSO  . CATARACT EXTRACTION     june 2016, july 2016  . CORONARY ANGIOPLASTY  2/12   Rotablator to the RCA  .  CORONARY STENT PLACEMENT    . ETHMOIDECTOMY Left 09/03/2017   Procedure: ETHMOIDECTOMY;  Surgeon: Clista Bernhardt, MD;  Location: [place];  Service: Ophthalmology;  Laterality: Left;  . HARDWARE REMOVAL  02/21/2011   Procedure: HARDWARE REMOVAL;  Surgeon: Laurice Record Aplington;  Location: [place];  Service: Orthopedics;  Laterality: Left;  REMOVAL TWO SCREWS DISTAL LEFT TIBIA   . LACRIMAL DUCT EXPLORATION Left 09/03/2017   Procedure: LACRIMAL DUCT EXPLORATION;  Surgeon: Clista Bernhardt, MD;  Location: [place];  Service: Ophthalmology;  Laterality: Left;  . LEFT HEART CATHETERIZATION WITH CORONARY ANGIOGRAM N/A 02/12/2012   Procedure: LEFT HEART CATHETERIZATION WITH CORONARY ANGIOGRAM;  Surgeon: Peter M  Martinique, MD;  Location: [facility] CATH LAB;  Service: Cardiovascular;  Laterality: N/A;  . LEG SURGERY Left    rod, pins, screws   . ORIF TIBIA & FIBULA FRACTURES  2000  . polyp removal    . SPINE SURGERY     2015     Current Outpatient Medications  Medication Sig Dispense Refill  . Alirocumab (PRALUENT) 75 MG/ML SOPN Inject 75 mg into the skin every 14 (fourteen) days. 2 pen 12  . ALPRAZolam (XANAX) 0.25 MG tablet Take 0.25 mg by mouth 2 (two) times daily as needed for anxiety.    Marland Kitchen azelastine (ASTELIN) 0.1 % nasal spray Place 1 spray into both nostrils daily. Use in each nostril as directed    . carvedilol (COREG) 12.5 MG tablet Take 1 tablet (12.5 mg total) by mouth 2 (two) times daily. 180 tablet 3  . ezetimibe (ZETIA) 10 MG tablet Take 1 tablet (10 mg total) daily by mouth. 90 tablet 3  . furosemide (LASIX) 20 MG tablet 1 TABLET BY MOUTH ONCE A WEEK (Patient taking differently: Take 20 mg by mouth once a week. 1 TABLET BY MOUTH ONCE A WEEK ON SATURDAYS) 12 tablet 3  . levocetirizine (XYZAL) 5 MG tablet Take 5 mg by mouth daily as needed for allergies.    Marland Kitchen lisinopril (PRINIVIL,ZESTRIL) 20 MG tablet Take 2 tablets (40 mg total) by mouth daily. 180 tablet 1  . lisinopril (PRINIVIL,ZESTRIL) 40 MG tablet Take 1 tablet (40 mg total) by mouth daily. 90 tablet 3  . nitroGLYCERIN (NITROSTAT) 0.4 MG SL tablet Place 1 tablet (0.4 mg total) under the tongue every 5 (five) minutes as needed. For chest pain 25 tablet 3   No current facility-administered medications for this visit.     Allergies:   Morphine; Phenergan [promethazine hcl]; and Imdur [isosorbide dinitrate]    Social History:  The patient  reports that she has never smoked. She has never used smokeless tobacco. She reports that she does not drink alcohol or use drugs.   Family History:  The patient's family history includes Cancer in her daughter and sister; Heart attack in her brother, brother, brother, father, mother, and sister; Heart  disease in her brother, daughter, father, mother, and son; Hyperlipidemia in her daughter; Varicose Veins in her daughter.    ROS:  Please see the history of present illness.   Otherwise, review of systems are positive for none.   All other systems are reviewed and negative.    PHYSICAL EXAM: VS:  BP 127/88   Pulse 94   Ht 5\' 6"  (1.676 m)   Wt 174 lb (78.9 kg)   BMI 28.08 kg/m  , BMI Body mass index is 28.08 kg/m. GENERAL:  Well appearing HEENT: Pupils equal round and reactive, fundi not visualized, oral mucosa unremarkable  NECK:  No jugular venous distention, waveform within normal limits, carotid upstroke brisk and symmetric, no bruits LUNGS:  Clear to auscultation bilaterally HEART:  RRR.  PMI not displaced or sustained,S1 and S2 within normal limits, no S3, no S4, no clicks, no rubs, no murmurs ABD:  Flat, positive bowel sounds normal in frequency in pitch, no bruits, no rebound, no guarding, no midline pulsatile mass, no hepatomegaly, no splenomegaly EXT:  2 plus pulses throughout, no edema, no cyanosis no clubbing SKIN:  No rashes no nodules NEURO:  Cranial nerves II through XII grossly intact, motor grossly intact throughout PSYCH:  Cognitively intact, oriented to person place and time   EKG:  EKG is not ordered today. The ekg ordered 04/11/16 demonstrates sinus bradycardia rate 76 bpm.  LAFB.  Absent R wave progression.  01/09/17: Sinus rhythm.  Rate 80 bpm.  LAFB.  Anterior TWI  08/18/17: Sinus rhythm.  Rate 83 bpm.  R axis deviation.    Lexiscan Myoview 05/28/15:  Nuclear stress EF: 60%.  No T wave inversion was noted during stress.  There was no ST segment deviation noted during stress.  Defect 1: There is a small defect of mild severity.  This is a low risk study.  Small, mild fixed inferolateral defect, likely artifact. No reversible ischemia. LVEF 60% with normal wall motion. This is a low risk study.  Echo 02/06/12: LVEF 55-60%.  Mild mitral regurgitation.      Recent Labs: 09/03/2017: Hemoglobin 13.2; Platelets 209 01/12/2018: ALT 12; BUN 14; Creatinine, Ser 0.64; Potassium 4.7; Sodium 142    Lipid Panel    Component Value Date/Time   CHOL 295 (H) 01/12/2018 1005   TRIG 154 (H) 01/12/2018 1005   HDL 55 01/12/2018 1005   CHOLHDL 5.4 (H) 01/12/2018 1005   CHOLHDL 6.2 (H) 05/30/2016 0900   VLDL 27 05/30/2016 0900   LDLCALC 209 (H) 01/12/2018 1005   LDLDIRECT 215.3 04/05/2013 1242   08/06/16: Sodium 141, potassium 4.7, BUN 16, creatinine 0.61 Total cholesterol 282, triglycerides 143, HDL 54, LDL 199 AST 17, ALT 24   Wt Readings from Last 3 Encounters:  01/26/18 174 lb (78.9 kg)  12/17/17 175 lb 6.4 oz (79.6 kg)  09/03/17 170 lb (77.1 kg)      ASSESSMENT AND PLAN:  # CAD s/p MI and RCA PCI: Ms. Cummiskey is reporting symptoms of chest pain.  Although it never occurs with exertion, she does not really exert herself much.  Given her history we will get a Lexiscan Myoview to better assess for ischemia.  She signed the paperwork for the patient assistance program and will start Praluent today.  Continue aspirin, carvedilol and Zetia.  # Shortness of breath:  # Tachycardia:  Stable.  Continue carvedilol.  # Hyperlipidemia:  Ms. Grosshans cholesterol is >100 despite being on atorvastatin 80 mg daily and Zetia.  She didn't tolerate atorvastatin.  Start Praluent as above.    # Hypertension: Blood pressure is controlled.  Continue carvedilol and lisinopril.   # Ascending aorta aneurysm: 4.2 cm on CT 08/2015.   4.3 cm on echo 08/28/17.  Continue carvedilol.  Current medicines are reviewed at length with the patient today.  The patient does not have concerns regarding medicines.  The following changes have been made: none  Labs/ tests ordered today include:   Orders Placed This Encounter  Procedures  . MYOCARDIAL PERFUSION IMAGING     Disposition:   FU with Acel Natzke C. Oval Linsey, MD, [facility] in 5 months.  Signed, Thursa Emme C. Oval Linsey,  MD, [facility]  01/26/2018 6:08 PM    [facility]

## 2018-01-26 NOTE — Telephone Encounter (Signed)
New Message   Pt's daughter is calling to schedule pt's stress test but no order in epic. Please call

## 2018-01-27 ENCOUNTER — Telehealth: Payer: Self-pay | Admitting: Cardiovascular Disease

## 2018-01-27 DIAGNOSIS — R079 Chest pain, unspecified: Secondary | ICD-10-CM

## 2018-01-27 NOTE — Telephone Encounter (Signed)
  Patients daughter is calling to set up stress test appointment but there is no order for it.

## 2018-01-27 NOTE — Telephone Encounter (Signed)
Order in [place] and patient has been scheduled

## 2018-01-27 NOTE — Telephone Encounter (Signed)
Stress test was ordered during OV yesterday. Unsure why it cannot be seen.   It has been reordered. Please schedule. Thanks

## 2018-02-23 ENCOUNTER — Inpatient Hospital Stay (HOSPITAL_COMMUNITY): Admission: RE | Admit: 2018-02-23 | Payer: Medicare Other | Source: Ambulatory Visit

## 2018-03-23 ENCOUNTER — Telehealth: Payer: Self-pay | Admitting: Cardiovascular Disease

## 2018-03-23 NOTE — Telephone Encounter (Signed)
° ° °  [employer] calling, stating they are re-faxing a form that needs to be completed and signed by Dr Oval Linsey for patient to receive products.  Any questions call [phone]

## 2018-03-23 NOTE — Telephone Encounter (Signed)
Received fax from [facility] regarding genetic testing. Per Dr Oval Linsey this needs to come from PCP. Advised [employer], verbalized understanding.

## 2018-03-30 ENCOUNTER — Telehealth: Payer: Self-pay

## 2018-03-30 NOTE — Telephone Encounter (Signed)
Called and spoke with pt regarding them needing to apply for a low income subsidy and if denied bring letter to the office and will re submit to pass

## 2018-04-20 NOTE — Telephone Encounter (Signed)
Called pt back to ask them if they had applied for low income subsidy and they stated they had gotten denied. I told the pt that the denial letter is infact what we needed to appeal the denial from the [employer] program to receive praluent free from the manufacturer and instructed the pt to call them back and ask for another denial letter and then we can appeal it and go from there.

## 2018-04-23 ENCOUNTER — Telehealth: Payer: Self-pay | Admitting: Cardiovascular Disease

## 2018-04-23 NOTE — Telephone Encounter (Signed)
Per pt call stated she is faxing the paperwork now.  Did not state what kind of paper work to [employer].

## 2018-04-27 ENCOUNTER — Telehealth: Payer: Self-pay

## 2018-04-27 ENCOUNTER — Other Ambulatory Visit: Payer: Self-pay

## 2018-04-27 MED ORDER — ALIROCUMAB 75 MG/ML ~~LOC~~ SOAJ: 75.0000 mg | SUBCUTANEOUS | 6 refills | Status: DC

## 2018-04-27 NOTE — Telephone Encounter (Signed)
Called to let the pt know that I received the lab work and thanked them for getting it done and also to let them know that they were denied via PASS for praluent to get it free of charge and I gave them the number to the [employer] to see if they can get a grant to go towards their medication

## 2018-05-11 ENCOUNTER — Other Ambulatory Visit: Payer: Self-pay

## 2018-05-11 MED ORDER — ALIROCUMAB 150 MG/ML ~~LOC~~ SOAJ: 150.0000 mg | SUBCUTANEOUS | 6 refills | Status: DC

## 2018-05-19 ENCOUNTER — Telehealth: Payer: Self-pay | Admitting: Cardiovascular Disease

## 2018-05-19 NOTE — Telephone Encounter (Signed)
Pt c/o BP issue: STAT if pt c/o blurred vision, one-sided weakness or slurred speech  1. What are your last 5 BP readings? 147/93 this morning, yesterday it was 157/127  2. Are you having any other symptoms (ex. Dizziness, headache, blurred vision, passed out)? Chest pain and some shortness of breath  3. What is your BP issue? Blood Pressure is running high

## 2018-05-19 NOTE — Telephone Encounter (Signed)
Spoke with pt and has noted SOB ,chest pain and some arm pain Pt thought arm pain was coming from being knocked down by dog not sure heart related Pt was to have myoview back in DEcember but was cancelled due to husband coming down with pneumonia Pt main concern is B/p MOnday was 146/128 a couple hours after taking medsPer pt  No ichange with caffeine increase but has maybe increased salt in take  Pt currently taking Furosemide 20 mg weekly ,Lisinopril 40 mg daily and Carvedilol 12.5 mg bid. Will forward to Dr Oval Linsey for review and recommendations Per pt has no computer .Adonis Housekeeper

## 2018-05-19 NOTE — Telephone Encounter (Signed)
Spoke with patient and her blood pressure 191/97 but states it has been running up. She saw PCP last week and told her she had been having chest pains and was told probably stress. She was started on Levothyroxine 25 mcg. No additional blood pressure medications. Will arrange phone visit for tomorrow. She has used NTG once or twice and it has helped. Phone visit for 11:00 scheduled for tomorrow.

## 2018-05-19 NOTE — Telephone Encounter (Signed)
What is her BP running now?  We can set her up for phone visit tomorrow.  If she is having ongoing chest pain and shortness of breath she needs to be seen in the ED.

## 2018-05-20 ENCOUNTER — Ambulatory Visit (INDEPENDENT_AMBULATORY_CARE_PROVIDER_SITE_OTHER): Payer: Medicare Other | Admitting: Cardiovascular Disease

## 2018-05-20 ENCOUNTER — Encounter: Payer: Self-pay | Admitting: Cardiovascular Disease

## 2018-05-20 VITALS — BP 194/95 | HR 68

## 2018-05-20 DIAGNOSIS — I10 Essential (primary) hypertension: Secondary | ICD-10-CM

## 2018-05-20 DIAGNOSIS — E785 Hyperlipidemia, unspecified: Secondary | ICD-10-CM

## 2018-05-20 DIAGNOSIS — I77810 Thoracic aortic ectasia: Secondary | ICD-10-CM | POA: Diagnosis not present

## 2018-05-20 DIAGNOSIS — I2510 Atherosclerotic heart disease of native coronary artery without angina pectoris: Secondary | ICD-10-CM

## 2018-05-20 DIAGNOSIS — R079 Chest pain, unspecified: Secondary | ICD-10-CM | POA: Diagnosis not present

## 2018-05-20 MED ORDER — AMLODIPINE BESYLATE 5 MG PO TABS: 5.0000 mg | ORAL_TABLET | Freq: Every day | ORAL | 1 refills | Status: DC

## 2018-05-20 NOTE — Progress Notes (Signed)
Virtual Visit via Telephone Note    Evaluation Performed:  Follow-up visit  This visit type was conducted due to national recommendations for restrictions regarding the COVID-19 Pandemic (e.g. social distancing).  This format is felt to be most appropriate for this patient at this time.  All issues noted in this document were discussed and addressed.  No physical exam was performed (except for noted visual exam findings with Video Visits).  Please refer to the patient's chart (MyChart message for video visits and phone note for telephone visits) for the patient's consent to telehealth for [facility].  Date:  05/20/2018   ID:  Lindsey Middleton, DOB 11/07/1940, MRN [id number]  Patient Location:  [place] [address]   Provider location:   [facility]  PCP:  Thea Alken  Cardiologist:  Skeet Latch, MD  Electrophysiologist:  None   Chief Complaint:    History of Present Illness:    Lindsey Middleton is a 78 y.o. female who presents via audio/video conferencing for a telehealth visit today.    Lindsey Middleton is a 78 y.o. female with CAD, hypertension, hyperlipidemia, mild ascending aortic aneurysm, and syncope who presents for follow up.  Ms. Balla was previously a patient of [name].  She had an MI in 1986 and in 2012 that was treated with DES to the mid RCA.  She had a cath in 2013 at which time the stent was patent and she had non-obstructive CAD.  She had a [employer] 05/2015 as a pre-op clearance and it was low risk for ischemia.  She last saw [name] 07/2015.  At that appointment her blood pressure was not well-controlled so lisinopril was increased to 20 mg daily.  Her blood pressure was still poorly controlled on 09/14/15.  Imdur was added to her regimen due to chest discomfort and poorly controlled hypertension. She was also noted to be tachycardic and reported increased shortness of breath as well as lower extremity edema. Therefore, she was  referred for lower extremity Dopplers that were negative for DVT.  She had a chest CT-A on 09/17/15 that was negative for pulmonary embolism. It showed moderate coronary artery calcifications and a mild ascending aortic aneurysm.  Her lipids have been poorly-controlled.  Her LDL has been over 170 on atorvastatin 80 mg daily so Zetia was added to her regimen.  Her lipids remained elevated so she was referred to the lipid clinic for consideration of a PCSK9 inhibitor.  She was referred for PCSK9 inhibitor but it was denied by insurance because of her age.  She was subsequently approved for the patient assistance program.  Ms. Crossett had an echo 08/2017 to followup on her ascending aorta aneurysm.  It was stable at 4.3 cm.  LVEF was 55-60% with grade 1 diastolic dysfunction.  Since her last appointment Ms. Hardgrave has been feeling stressed.  Her husband's dementia is progressing.   Her daughter Juliann Pulse moved to [facility] and she doesn't have any help at home.  Her BP has been running high.  Last night it was 153/106 and this AM 194/95.  She has been very stressed and anxious.  Last week she had chest pain that awakened her from sleep.  She got up and took a nitroglycerin, which helped.  She had a couple episodes of chest pain with exertion.  At the time her BP was very elevated.  She has not been exercising because her husband had the flu and they are  stuck in the house.   She also isn't sleeping well because her husband takes off his BiPAP machine at night.  She stays up to put it back on him.  Today she is feeling better and has no chest pain.  Her breathing is stable and she denies lower extremity edema, orthopnea or PND.   The patient does not symptoms concerning for COVID-19 infection (fever, chills, cough, or new SHORTNESS OF BREATH).    Prior CV studies:   The following studies were reviewed today:  Lexiscan Myoview 05/28/15:  Nuclear stress EF: 60%.  No T wave inversion was noted during stress.  There was no  ST segment deviation noted during stress.  Defect 1: There is a small defect of mild severity.  This is a low risk study.  Small, mild fixed inferolateral defect, likely artifact. No reversible ischemia. LVEF 60% with normal wall motion. This is a low risk study.  Echo 02/06/12: LVEF 55-60%.  Mild mitral regurgitation.    Past Medical History:  Diagnosis Date  . Ascending aortic aneurysm (HCC)    4.2 cm by CT 08/2015.  Repeat in 1 year.  . Asthma   . Coronary artery disease    s/p Rotablator to mid RCA in 2/12  . Dyslipidemia   . Fatigue   . GERD (gastroesophageal reflux disease)   . Heart attack ([place])    1986  . Hypertension   . PONV (postoperative nausea and vomiting)   . Rectal polyp    hyplastic  . Varicose vein   . Weakness    Past Surgical History:  Procedure Laterality Date  . ABDOMINAL ANGIOGRAM  02/12/2012   Procedure: ABDOMINAL ANGIOGRAM;  Surgeon: Peter M Martinique, MD;  Location: [facility] CATH LAB;  Service: Cardiovascular;;  . ABDOMINAL HYSTERECTOMY  1996   total w/BSO  . CATARACT EXTRACTION     june 2016, july 2016  . CORONARY ANGIOPLASTY  2/12   Rotablator to the RCA  . CORONARY STENT PLACEMENT    . ETHMOIDECTOMY Left 09/03/2017   Procedure: ETHMOIDECTOMY;  Surgeon: Clista Bernhardt, MD;  Location: [place];  Service: Ophthalmology;  Laterality: Left;  . HARDWARE REMOVAL  02/21/2011   Procedure: HARDWARE REMOVAL;  Surgeon: Laurice Record Aplington;  Location: [place];  Service: Orthopedics;  Laterality: Left;  REMOVAL TWO SCREWS DISTAL LEFT TIBIA   . LACRIMAL DUCT EXPLORATION Left 09/03/2017   Procedure: LACRIMAL DUCT EXPLORATION;  Surgeon: Clista Bernhardt, MD;  Location: [place];  Service: Ophthalmology;  Laterality: Left;  . LEFT HEART CATHETERIZATION WITH CORONARY ANGIOGRAM N/A 02/12/2012   Procedure: LEFT HEART CATHETERIZATION WITH CORONARY ANGIOGRAM;  Surgeon: Peter M Martinique, MD;  Location: [facility] CATH LAB;  Service: Cardiovascular;  Laterality: N/A;   . LEG SURGERY Left    rod, pins, screws   . ORIF TIBIA & FIBULA FRACTURES  2000  . polyp removal    . SPINE SURGERY     2015     No outpatient medications have been marked as taking for the 05/20/18 encounter (Office Visit) with Skeet Latch, MD.     Allergies:   Morphine; Phenergan [promethazine hcl]; and Imdur [isosorbide dinitrate]   Social History   Tobacco Use  . Smoking status: Never Smoker  . Smokeless tobacco: Never Used  Substance Use Topics  . Alcohol use: No    Alcohol/week: 0.0 standard drinks  . Drug use: No     Family Hx: The patient's family history includes Cancer in her daughter and  sister; Heart attack in her brother, brother, brother, father, mother, and sister; Heart disease in her brother, daughter, father, mother, and son; Hyperlipidemia in her daughter; Varicose Veins in her daughter. There is no history of Colon cancer.  ROS:   Please see the history of present illness.     All other systems reviewed and are negative.   Labs/Other Tests and Data Reviewed:    Recent Labs: 09/03/2017: Hemoglobin 13.2; Platelets 209 01/12/2018: ALT 12; BUN 14; Creatinine, Ser 0.64; Potassium 4.7; Sodium 142   Recent Lipid Panel Lab Results  Component Value Date/Time   CHOL 295 (H) 01/12/2018 10:05 AM   TRIG 154 (H) 01/12/2018 10:05 AM   HDL 55 01/12/2018 10:05 AM   CHOLHDL 5.4 (H) 01/12/2018 10:05 AM   CHOLHDL 6.2 (H) 05/30/2016 09:00 AM   LDLCALC 209 (H) 01/12/2018 10:05 AM   LDLDIRECT 215.3 04/05/2013 12:42 PM    Wt Readings from Last 3 Encounters:  01/26/18 174 lb (78.9 kg)  12/17/17 175 lb 6.4 oz (79.6 kg)  09/03/17 170 lb (77.1 kg)     Exam:    Vital Signs:  BP (!) 194/95   Pulse 68    Gen: No acute stress.  Anxious and tearful at times Resp: breathing non-labored Neuro: Speech fluent Psych: AOx3  ASSESSMENT & PLAN:    # Hypertension: Blood pressure is poorly controlled.  Stress is certainly contributing.  Continue carvedilol and  lisinopril.  We will add amlodipine 5mg  daily.  # CAD s/p MI and RCA PCI:  # Chest pain: Ms. Mclucas is reporting symptoms of chest pain.  She has had intermitted chest pain and was scheduled for a Lexiscan Myoview that was pushed back 2/2 her husband's illness.  I suspect her CP now is related to her poorly controlled hypertension.  Stress is also contributing.  We will work on her BP and if she continues to have chest pain despite adequate BP control we will get a cath.  We are currently unable to get stress testing 2/2 COVID-19.  Continue aspirin, carvedilol and Zetia.  She was denied by her insurance for PCSK9 inhibitor.  # Shortness of breath:  # Tachycardia:  Stable.  Continue carvedilol.  # Hyperlipidemia:  Ms. Joerger cholesterol is >100 despite being on atorvastatin 80 mg daily and Zetia.  She didn't tolerate atorvastatin. Will work on patient assistance for PCSK9 inhibitor.  # Ascending aorta aneurysm: 4.2 cm on CT 08/2015.   4.3 cm on echo 08/28/17.  Continue carvedilol.    COVID-19 Education: The signs and symptoms of COVID-19 were discussed with the patient and how to seek care for testing (follow up with PCP or arrange E-visit).  The importance of social distancing was discussed today.  Patient Risk:   After full review of this patients clinical status, I feel that they are at least moderate risk at this time.  Time:   Today, I have spent 23 minutes with the patient with telehealth technology discussing chest pain and hypertension.     Medication Adjustments/Labs and Tests Ordered: Current medicines are reviewed at length with the patient today.  Concerns regarding medicines are outlined above.  Tests Ordered: No orders of the defined types were placed in this encounter.  Medication Changes: No orders of the defined types were placed in this encounter.   Disposition: Follow up with Dabria Wadas C. Oval Linsey, MD, [facility]  in 1 week  Signed, Skeet Latch, MD  05/20/2018  11:42 AM    [facility]  

## 2018-05-20 NOTE — Patient Instructions (Signed)
Medication Instructions:  START AMLODIPINE 5 MG DAILY   If you need a refill on your cardiac medications before your next appointment, please call your pharmacy.   Lab work: NONE If you have labs (blood work) drawn today and your tests are completely normal, you will receive your results only by: Marland Kitchen MyChart Message (if you have MyChart) OR . A paper copy in the mail If you have any lab test that is abnormal or we need to change your treatment, we will call you to review the results.  Testing/Procedures: NONE  Follow-Up: 05/26/18 BY PHONE   Any Other Special Instructions Will Be Listed Below (If Applicable). MONITOR AND LOG YOUR BLOOD PRESSURE

## 2018-05-20 NOTE — Addendum Note (Signed)
Addended by: Earvin Hansen on: 05/20/2018 06:29 PM   Modules accepted: Orders

## 2018-05-25 ENCOUNTER — Other Ambulatory Visit: Payer: Self-pay

## 2018-05-26 ENCOUNTER — Telehealth: Payer: Self-pay | Admitting: Cardiovascular Disease

## 2018-05-26 ENCOUNTER — Telehealth (INDEPENDENT_AMBULATORY_CARE_PROVIDER_SITE_OTHER): Payer: Medicare Other | Admitting: Cardiovascular Disease

## 2018-05-26 VITALS — BP 143/90 | HR 83

## 2018-05-26 DIAGNOSIS — R002 Palpitations: Secondary | ICD-10-CM | POA: Diagnosis not present

## 2018-05-26 DIAGNOSIS — I712 Thoracic aortic aneurysm, without rupture: Secondary | ICD-10-CM

## 2018-05-26 DIAGNOSIS — I10 Essential (primary) hypertension: Secondary | ICD-10-CM

## 2018-05-26 DIAGNOSIS — I7121 Aneurysm of the ascending aorta, without rupture: Secondary | ICD-10-CM

## 2018-05-26 NOTE — Patient Instructions (Addendum)
Medication Instructions:  INCREASE YOUR AMLODIPINE TO 10 MG DAILY   If you need a refill on your cardiac medications before your next appointment, please call your pharmacy.   Lab work: NONE  Testing/Procedures: Your physician has recommended that you wear an event monitor. Event monitors are medical devices that record the heart's electrical activity. Doctors most often [place] these monitors to diagnose arrhythmias. Arrhythmias are problems with the speed or rhythm of the heartbeat. The monitor is a small, portable device. You can wear one while you do your normal daily activities. This is usually used to diagnose what is causing palpitations/syncope (passing out). 7 DAY EVENT  THIS WILL BE MAILED   Follow-Up: KEEP AS SCHEDULED 5/13 AT 9:20

## 2018-05-26 NOTE — Telephone Encounter (Signed)
[place] from [facility] called and wanted to discuss a medication for the patient. She was interest to know if the Pt wanted to use Praulent, as it is in the formulary now , instead of repatha Current Med list shows that the pt is already on Praulent.  Does the provider want the patient to switch?

## 2018-05-26 NOTE — Telephone Encounter (Signed)
Returned call to [facility] and was informed that no messages or notes were documented to support call from [place] at [facility]. Advised that she could call us back if she still needed more information r/e praluent.

## 2018-05-26 NOTE — Progress Notes (Signed)
Virtual Visit via Telephone Note    Evaluation Performed:  Follow-up visit  This visit type was conducted due to national recommendations for restrictions regarding the COVID-19 Pandemic (e.g. social distancing).  This format is felt to be most appropriate for this patient at this time.  All issues noted in this document were discussed and addressed.  No physical exam was performed (except for noted visual exam findings with Video Visits).  Please refer to the patient's chart (MyChart message for video visits and phone note for telephone visits) for the patient's consent to telehealth for [facility].  Date:  05/26/2018   ID:  Lindsey Middleton, DOB October 01, 1940, MRN [id number]  Patient Location:  [place] [address]   Provider location:   [facility]  PCP:  Thea Alken  Cardiologist:  Skeet Latch, MD  Electrophysiologist:  None   Chief Complaint:    History of Present Illness:    Lindsey Middleton is a 78 y.o. female who presents via audio/video conferencing for a telehealth visit today.    Lindsey Middleton is a 78 y.o. female with CAD, hypertension, hyperlipidemia, mild ascending aortic aneurysm, and syncope who presents for follow up.  Lindsey Middleton was previously a patient of [name].  She had an MI in 1986 and in 2012 that was treated with DES to the mid RCA.  She had a cath in 2013 at which time the stent was patent and she had non-obstructive CAD.  She had a [employer] 05/2015 as a pre-op clearance and it was low risk for ischemia.  She last saw [name] 07/2015.  At that appointment her blood pressure was not well-controlled so lisinopril was increased to 20 mg daily.  Her blood pressure was still poorly controlled on 09/14/15.  Imdur was added to her regimen due to chest discomfort and poorly controlled hypertension. She was also noted to be tachycardic and reported increased shortness of breath as well as lower extremity edema. Therefore, she was  referred for lower extremity Dopplers that were negative for DVT.  She had a chest CT-A on 09/17/15 that was negative for pulmonary embolism. It showed moderate coronary artery calcifications and a mild ascending aortic aneurysm.  Her lipids have been poorly-controlled.  Her LDL has been over 170 on atorvastatin 80 mg daily so Zetia was added to her regimen.  Her lipids remained elevated so she was referred to the lipid clinic for consideration of a PCSK9 inhibitor.  She was referred for PCSK9 inhibitor but it was denied by insurance because of her age.  She was subsequently approved for the patient assistance program.  Lindsey Middleton had an echo 08/2017 to followup on her ascending aorta aneurysm.  It was stable at 4.3 cm.  LVEF was 55-60% with grade 1 diastolic dysfunction.  Since her last appointment Lindsey Middleton has been feeling stressed.  Her husband's dementia is progressing.   Her daughter Juliann Pulse moved to [facility] and she doesn't have any help at home.  At her last appointment her BP was running in the 150s-190s/90s-100s.  She was started on amlodipine 5mg  daily.  Her BP is now in the 130s-150s/90s.  She got a new BP cuff and it often says that her heart rate is irregular.  She doesn't feel it.  The patient does not symptoms concerning for COVID-19 infection (fever, chills, cough, or new SHORTNESS OF BREATH).    Prior CV studies:   The following studies were reviewed today:  Lexiscan Myoview 05/28/15:  Nuclear stress EF: 60%.  No T wave inversion was noted during stress.  There was no ST segment deviation noted during stress.  Defect 1: There is a small defect of mild severity.  This is a low risk study.  Small, mild fixed inferolateral defect, likely artifact. No reversible ischemia. LVEF 60% with normal wall motion. This is a low risk study.  Echo 02/06/12: LVEF 55-60%.  Mild mitral regurgitation.    Past Medical History:  Diagnosis Date   Ascending aortic aneurysm (HCC)    4.2 cm by CT 08/2015.   Repeat in 1 year.   Asthma    Coronary artery disease    s/p Rotablator to mid RCA in 2/12   Dyslipidemia    Fatigue    GERD (gastroesophageal reflux disease)    Heart attack ([place])    1986   Hypertension    PONV (postoperative nausea and vomiting)    Rectal polyp    hyplastic   Varicose vein    Weakness    Past Surgical History:  Procedure Laterality Date   ABDOMINAL ANGIOGRAM  02/12/2012   Procedure: ABDOMINAL ANGIOGRAM;  Surgeon: Peter M Martinique, MD;  Location: [facility] CATH LAB;  Service: Cardiovascular;;   ABDOMINAL HYSTERECTOMY  1996   total w/BSO   CATARACT EXTRACTION     june 2016, july 2016   CORONARY ANGIOPLASTY  2/12   Rotablator to the RCA   CORONARY STENT PLACEMENT     ETHMOIDECTOMY Left 09/03/2017   Procedure: ETHMOIDECTOMY;  Surgeon: Clista Bernhardt, MD;  Location: [facility];  Service: Ophthalmology;  Laterality: Left;   HARDWARE REMOVAL  02/21/2011   Procedure: HARDWARE REMOVAL;  Surgeon: Laurice Record [place];  Location: [place];  Service: Orthopedics;  Laterality: Left;  REMOVAL TWO SCREWS DISTAL LEFT TIBIA    LACRIMAL DUCT EXPLORATION Left 09/03/2017   Procedure: LACRIMAL DUCT EXPLORATION;  Surgeon: Clista Bernhardt, MD;  Location: [place];  Service: Ophthalmology;  Laterality: Left;   LEFT HEART CATHETERIZATION WITH CORONARY ANGIOGRAM N/A 02/12/2012   Procedure: LEFT HEART CATHETERIZATION WITH CORONARY ANGIOGRAM;  Surgeon: Peter M Martinique, MD;  Location: [facility] CATH LAB;  Service: Cardiovascular;  Laterality: N/A;   LEG SURGERY Left    rod, pins, screws    ORIF TIBIA & FIBULA FRACTURES  2000   polyp removal     SPINE SURGERY     2015     No outpatient medications have been marked as taking for the 05/26/18 encounter (Telemedicine) with Skeet Latch, MD.     Allergies:   Morphine; Phenergan [promethazine hcl]; and Imdur [isosorbide dinitrate]   Social History   Tobacco Use   Smoking status: Never Smoker   Smokeless  tobacco: Never Used  Substance Use Topics   Alcohol use: No    Alcohol/week: 0.0 standard drinks   Drug use: No     Family Hx: The patient's family history includes Cancer in her daughter and sister; Heart attack in her brother, brother, brother, father, mother, and sister; Heart disease in her brother, daughter, father, mother, and son; Hyperlipidemia in her daughter; Varicose Veins in her daughter. There is no history of Colon cancer.  ROS:   Please see the history of present illness.     All other systems reviewed and are negative.   Labs/Other Tests and Data Reviewed:    Recent Labs: 09/03/2017: Hemoglobin 13.2; Platelets 209 01/12/2018: ALT 12; BUN 14; Creatinine, Ser 0.64; Potassium 4.7; Sodium 142  Recent Lipid Panel Lab Results  Component Value Date/Time   CHOL 295 (H) 01/12/2018 10:05 AM   TRIG 154 (H) 01/12/2018 10:05 AM   HDL 55 01/12/2018 10:05 AM   CHOLHDL 5.4 (H) 01/12/2018 10:05 AM   CHOLHDL 6.2 (H) 05/30/2016 09:00 AM   LDLCALC 209 (H) 01/12/2018 10:05 AM   LDLDIRECT 215.3 04/05/2013 12:42 PM    Wt Readings from Last 3 Encounters:  01/26/18 174 lb (78.9 kg)  12/17/17 175 lb 6.4 oz (79.6 kg)  09/03/17 170 lb (77.1 kg)     Exam:    Vital Signs:  BP (!) 143/90    Pulse 83    Gen: No acute stress.  Anxious and tearful at times Resp: breathing non-labored Neuro: Speech fluent Psych: AOx3  ASSESSMENT & PLAN:    # Hypertension: Blood pressure is Still above goal but better controlled.  Will increase amlodipine to 10 mg daily.  Continue carvedilol and lisinopril.    # CAD s/p MI and RCA PCI:  # Chest pain:  Chest pain has resolved now that her blood pressure is better controlled.  She has had intermitted chest pain and was scheduled for a Lexiscan Myoview that was pushed back 2/2 her husband's illness.  I suspect her chest pain now is related to her poorly controlled hypertension.  Stress is also contributing.  No plans for repeat stress testing at  this time unless her chest pain recurs. Continue aspirin, carvedilol and Zetia.  She was denied by her insurance for PCSK9 inhibitor.  # Shortness of breath:  # Tachycardia:   Heart rates are less than 100 but have been irregular on her blood pressure monitor.  We will get a 7-day event monitor.  Continue carvedilol.  # Hyperlipidemia:  Lindsey Middleton cholesterol is >100 despite being on atorvastatin 80 mg daily and Zetia.  She didn't tolerate atorvastatin. Will work on patient assistance for PCSK9 inhibitor.  # Ascending aorta aneurysm: 4.2 cm on CT 08/2015.   4.3 cm on echo 08/28/17.  Continue carvedilol.    COVID-19 Education: The signs and symptoms of COVID-19 were discussed with the patient and how to seek care for testing (follow up with PCP or arrange E-visit).  The importance of social distancing was discussed today.  Patient Risk:   After full review of this patients clinical status, I feel that they are at least moderate risk at this time.  Time:   Today, I have spent 22 minutes with the patient with telehealth technology discussing chest pain and hypertension.     Medication Adjustments/Labs and Tests Ordered: Current medicines are reviewed at length with the patient today.  Concerns regarding medicines are outlined above.  Tests Ordered: No orders of the defined types were placed in this encounter.  Medication Changes: No orders of the defined types were placed in this encounter.   Disposition: Follow up with Norvell Middleton C. Oval Linsey, MD, [facility] in 1 month.  Signed, Skeet Latch, MD  05/26/2018 11:54 AM    [place]

## 2018-05-27 ENCOUNTER — Telehealth: Payer: Self-pay | Admitting: Radiology

## 2018-05-27 MED ORDER — AMLODIPINE BESYLATE 10 MG PO TABS: 10.0000 mg | ORAL_TABLET | Freq: Every day | ORAL | 1 refills | Status: DC

## 2018-05-27 NOTE — Addendum Note (Signed)
Addended by: Alvina Filbert B on: 05/27/2018 09:30 AM   Modules accepted: Orders

## 2018-05-27 NOTE — Telephone Encounter (Signed)
Event monitor was enrolled to be mailed to patient on 4/2 due to Covid-19. Patient knows to expect a call from [employer] and she will receive the monitor in 3-5 days

## 2018-05-28 ENCOUNTER — Telehealth: Payer: Self-pay

## 2018-05-28 MED ORDER — EVOLOCUMAB 140 MG/ML ~~LOC~~ SOAJ: 140.0000 mg | SUBCUTANEOUS | 6 refills | Status: DC

## 2018-05-28 NOTE — Telephone Encounter (Signed)
Called to let the patient to let them know that the insurance approved the repatha because it was on formuary and sent rx to [employer] awaiting a call back as to the price of the drug

## 2018-06-03 ENCOUNTER — Encounter (INDEPENDENT_AMBULATORY_CARE_PROVIDER_SITE_OTHER): Payer: Medicare Other

## 2018-06-03 DIAGNOSIS — R002 Palpitations: Secondary | ICD-10-CM

## 2018-06-04 ENCOUNTER — Telehealth: Payer: Self-pay | Admitting: Cardiovascular Disease

## 2018-06-04 NOTE — Telephone Encounter (Signed)
Pt to call monitoring company for help with device. Pt will return call to office if they are unable to help.

## 2018-06-04 NOTE — Telephone Encounter (Signed)
  Patient needs help with the heart monitor. Please call to help with instructions

## 2018-06-16 ENCOUNTER — Other Ambulatory Visit: Payer: Self-pay

## 2018-06-18 ENCOUNTER — Other Ambulatory Visit: Payer: Self-pay

## 2018-06-23 ENCOUNTER — Telehealth: Payer: Self-pay | Admitting: Cardiovascular Disease

## 2018-06-23 NOTE — Telephone Encounter (Signed)
Spoke with patient regarding message. She stated she received a message from our number to go to her mychart.  Advised patient she is not even on mychart disregard message. She has no computer to have [employer]

## 2018-06-23 NOTE — Telephone Encounter (Signed)
  Ms Cordial says that Rip Harbour let her know that she sent a message to MyChart for her but her computer is messed up and she would like Rip Harbour to call her to with the message.

## 2018-07-01 ENCOUNTER — Telehealth: Payer: Self-pay

## 2018-07-01 NOTE — Telephone Encounter (Signed)
Virtual Visit Pre-Appointment Phone Call [place] PHONE  "(Name), I am calling you today to discuss your upcoming appointment. We are currently trying to limit exposure to the virus that causes COVID-19 by seeing patients at home rather than in the office."  1. "What is the BEST phone number to call the day of the visit?" - include this in appointment notes  2. "Do you have or have access to (through a family member/friend) a smartphone with video capability that we can use for your visit?" a. If yes - list this number in appt notes as "cell" (if different from BEST phone #) and list the appointment type as a VIDEO visit in appointment notes b. If no - list the appointment type as a PHONE visit in appointment notes  3. Confirm consent - "In the setting of the current Covid19 crisis, you are scheduled for a (phone or video) visit with your provider on (date) at (time).  Just as we do with many in-office visits, in order for you to participate in this visit, we must obtain consent.  If you'd like, I can send this to your mychart (if signed up) or email for you to review.  Otherwise, I can obtain your verbal consent now.  All virtual visits are billed to your insurance company just like a normal visit would be.  By agreeing to a virtual visit, we'd like you to understand that the technology does not allow for your provider to perform an examination, and thus may limit your provider's ability to fully assess your condition. If your provider identifies any concerns that need to be evaluated in person, we will make arrangements to do so.  Finally, though the technology is pretty good, we cannot assure that it will always work on either your or our end, and in the setting of a video visit, we may have to convert it to a phone-only visit.  In either situation, we cannot ensure that we have a secure connection.  Are you willing to proceed?" STAFF: Did the patient verbally acknowledge consent to  telehealth visit? Document YES/NO here:   4. Advise patient to be prepared - "Two hours prior to your appointment, go ahead and check your blood pressure, pulse, oxygen saturation, and your weight (if you have the equipment to check those) and write them all down. When your visit starts, your provider will ask you for this information. If you have an Apple Watch or Kardia device, please plan to have heart rate information ready on the day of your appointment. Please have a pen and paper handy nearby the day of the visit as well."  5. Give patient instructions for MyChart download to smartphone OR Doximity/Doxy.me as below if video visit (depending on what platform provider is using)  6. Inform patient they will receive a phone call 15 minutes prior to their appointment time (may be from unknown caller ID) so they should be prepared to answer    TELEPHONE CALL NOTE  Lindsey Middleton has been deemed a candidate for a follow-up tele-health visit to limit community exposure during the Covid-19 pandemic. I spoke with the patient via phone to ensure availability of phone/video source, confirm preferred email & phone number, and discuss instructions and expectations.  I reminded Lindsey Middleton to be prepared with any vital sign and/or heart rhythm information that could potentially be obtained via home monitoring, at the time of her visit. I reminded Lindsey Middleton to expect a  phone call prior to her visit.  [place] 07/01/2018 3:58 PM   INSTRUCTIONS FOR DOWNLOADING THE MYCHART APP TO SMARTPHONE  - The patient must first make sure to have activated MyChart and know their login information - If Apple, go to [employer] and type in MyChart in the search bar and download the app. If Android, ask patient to go to [employer] and type in [place] in the search bar and download the app. The app is free but as with any other app downloads, their phone may require them to verify saved payment  information or Apple/Android password.  - The patient will need to then log into the app with their MyChart username and password, and select [facility] as their healthcare provider to link the account. When it is time for your visit, go to the MyChart app, find appointments, and click Begin Video Visit. Be sure to Select Allow for your device to access the Microphone and Camera for your visit. You will then be connected, and your provider will be with you shortly.  **If they have any issues connecting, or need assistance please contact [place] [phone])  **If using a computer, in order to ensure the best quality for their visit they will need to use either of the following Internet Browsers: [employer], or Google Chrome  IF USING DOXIMITY or DOXY.ME - The patient will receive a link just prior to their visit by text.     FULL LENGTH CONSENT FOR TELE-HEALTH VISIT   I hereby voluntarily request, consent and authorize [facility] and its employed or contracted physicians, physician assistants, nurse practitioners or other licensed health care professionals (the Practitioner), to provide me with telemedicine health care services (the "Services") as deemed necessary by the treating Practitioner. I acknowledge and consent to receive the Services by the Practitioner via telemedicine. I understand that the telemedicine visit will involve communicating with the Practitioner through live audiovisual communication technology and the disclosure of certain medical information by electronic transmission. I acknowledge that I have been given the opportunity to request an in-person assessment or other available alternative prior to the telemedicine visit and am voluntarily participating in the telemedicine visit.  I understand that I have the right to withhold or withdraw my consent to the use of telemedicine in the course of my care at any time, without affecting my right to  future care or treatment, and that the Practitioner or I may terminate the telemedicine visit at any time. I understand that I have the right to inspect all information obtained and/or recorded in the course of the telemedicine visit and may receive copies of available information for a reasonable fee.  I understand that some of the potential risks of receiving the Services via telemedicine include:  Marland Kitchen Delay or interruption in medical evaluation due to technological equipment failure or disruption; . Information transmitted may not be sufficient (e.g. poor resolution of images) to allow for appropriate medical decision making by the Practitioner; and/or  . In rare instances, security protocols could fail, causing a breach of personal health information.  Furthermore, I acknowledge that it is my responsibility to provide information about my medical history, conditions and care that is complete and accurate to the best of my ability. I acknowledge that Practitioner's advice, recommendations, and/or decision may be based on factors not within their control, such as incomplete or inaccurate data provided by me or distortions of diagnostic images or specimens that may  result from electronic transmissions. I understand that the practice of medicine is not an exact science and that Practitioner makes no warranties or guarantees regarding treatment outcomes. I acknowledge that I will receive a copy of this consent concurrently upon execution via email to the email address I last provided but may also request a printed copy by calling the office of [place].    I understand that my insurance will be billed for this visit.   I have read or had this consent read to me. . I understand the contents of this consent, which adequately explains the benefits and risks of the Services being provided via telemedicine.  . I have been provided ample opportunity to ask questions regarding this consent and the Services  and have had my questions answered to my satisfaction. . I give my informed consent for the services to be provided through the use of telemedicine in my medical care  By participating in this telemedicine visit I agree to the above.

## 2018-07-06 ENCOUNTER — Telehealth: Payer: Self-pay | Admitting: Pharmacist

## 2018-07-06 NOTE — Telephone Encounter (Signed)
Repatha 140mg  SafetyNet foundation approved from 06/28/2018 to 02/24/2019.   Patietn aware and instructed to place order today 5/12. She will call back if problems with Repatha or help needed to use injection.

## 2018-07-07 ENCOUNTER — Telehealth: Payer: Self-pay | Admitting: Cardiovascular Disease

## 2018-07-07 ENCOUNTER — Ambulatory Visit: Payer: Medicare Other | Admitting: Cardiovascular Disease

## 2018-07-07 NOTE — Telephone Encounter (Signed)
Smartphone/ my chart declined/ consent/ pre reg completed  °

## 2018-07-08 ENCOUNTER — Telehealth (INDEPENDENT_AMBULATORY_CARE_PROVIDER_SITE_OTHER): Payer: Medicare Other | Admitting: Cardiovascular Disease

## 2018-07-08 VITALS — BP 129/102 | HR 79 | Ht 66.0 in | Wt 167.2 lb

## 2018-07-08 DIAGNOSIS — I10 Essential (primary) hypertension: Secondary | ICD-10-CM | POA: Diagnosis not present

## 2018-07-08 DIAGNOSIS — Z5181 Encounter for therapeutic drug level monitoring: Secondary | ICD-10-CM

## 2018-07-08 DIAGNOSIS — E7800 Pure hypercholesterolemia, unspecified: Secondary | ICD-10-CM

## 2018-07-08 DIAGNOSIS — I2510 Atherosclerotic heart disease of native coronary artery without angina pectoris: Secondary | ICD-10-CM | POA: Diagnosis not present

## 2018-07-08 MED ORDER — HYDRALAZINE HCL 25 MG PO TABS: 25.0000 mg | ORAL_TABLET | Freq: Two times a day (BID) | ORAL | 1 refills | Status: DC

## 2018-07-08 NOTE — Patient Instructions (Addendum)
Medication Instructions:  START HYDRALAZINE 25 MG TWICE A DAY   START ASPIRIN 56 MG DAILY   STOP ZETIA   If you need a refill on your cardiac medications before your next appointment, please call your pharmacy.   Lab work: FASTING LP/CMET IN 2 MONTHS   If you have labs (blood work) drawn today and your tests are completely normal, you will receive your results only by: Marland Kitchen MyChart Message (if you have MyChart) OR . A paper copy in the mail If you have any lab test that is abnormal or we need to change your treatment, we will call you to review the results.  Testing/Procedures: NONE  Follow-Up: At [facility], you and your health needs are our priority.  As part of our continuing mission to provide you with exceptional heart care, we have created designated Provider Care Teams.  These Care Teams include your primary Cardiologist (physician) and Advanced Practice Providers (APPs -  Physician Assistants and Nurse Practitioners) who all work together to provide you with the care you need, when you need it. You will need a follow up appointment in 6 months.  Please call our office 2 months in advance to schedule this appointment.  You may see Skeet Latch, MD or one of the following Advanced Practice Providers on your designated Care Team:   Kerin Ransom, PA-C Roby Lofts, [place] . Sande Rives, PA-C  Your physician recommends that you schedule a follow-up appointment in: 1 MONTH WITH PHARM D. THE OFFICE WILL CALL YOU WITH DATE AND TIME   Any Other Special Instructions Will Be Listed Below (If Applicable).  CONTINUE TO MONITOR YOUR BLOOD PRESSURE AT HOME

## 2018-07-08 NOTE — Progress Notes (Signed)
Virtual Visit via Telephone Note    Evaluation Performed:  Follow-up visit  This visit type was conducted due to national recommendations for restrictions regarding the COVID-19 Pandemic (e.g. social distancing).  This format is felt to be most appropriate for this patient at this time.  All issues noted in this document were discussed and addressed.  No physical exam was performed (except for noted visual exam findings with Video Visits).  Please refer to the patient's chart (MyChart message for video visits and phone note for telephone visits) for the patient's consent to telehealth for [facility].  Date:  07/08/2018   ID:  Lindsey Middleton, DOB 1940/12/27, MRN [id number]  Patient Location:  [place] [address]   Provider location:   [facility]  PCP:  Thea Alken  Cardiologist:  Skeet Latch, MD  Electrophysiologist:  None   Chief Complaint:    History of Present Illness:    Lindsey Middleton is a 78 y.o. female who presents via audio/video conferencing for a telehealth visit today.    Lindsey Middleton is a 78 y.o. female with CAD, hypertension, hyperlipidemia, mild ascending aortic aneurysm, and syncope who presents for follow up.  Ms. Patricelli was previously a patient of [name].  She had an MI in 1986 and in 2012 that was treated with DES to the mid RCA.  She had a cath in 2013 at which time the stent was patent and she had non-obstructive CAD.  She had a [employer] 05/2015 as a pre-op clearance and it was low risk for ischemia.  She last saw [name] 07/2015.  At that appointment her blood pressure was not well-controlled so lisinopril was increased to 20 mg daily.  Her blood pressure was still poorly controlled on 09/14/15.  Imdur was added to her regimen due to chest discomfort and poorly controlled hypertension. She was also noted to be tachycardic and reported increased shortness of breath as well as lower extremity edema. Therefore, she was  referred for lower extremity Dopplers that were negative for DVT.  She had a chest CT-A on 09/17/15 that was negative for pulmonary embolism. It showed moderate coronary artery calcifications and a mild ascending aortic aneurysm.  Her lipids have been poorly-controlled.  Her LDL has been over 170 on atorvastatin 80 mg daily so Zetia was added to her regimen.  Her lipids remained elevated so she was referred to the lipid clinic for consideration of a PCSK9 inhibitor.  She was referred for PCSK9 inhibitor but it was denied by insurance because of her age.  She was subsequently approved for the patient assistance program and it will be delivered today.  Ms. Bowden had an echo 08/2017 to followup on her ascending aorta aneurysm.  It was stable at 4.3 cm.  LVEF was 55-60% with grade 1 diastolic dysfunction.    At her last appointment she was feeling stressed due to her husband's progressive dementia and her daughter moving away.  Her blood pressure was poorly-controlled.  She also noted that her blood pressure monitor told her her heart rate was irregular.  Amlodipine was increased to 10 mg daily.  Since her last appointment her blood pressure has still been erratic.  It has been mostly in the 120s-130s/90s-100s.  At times it gets as high as the 150s.  She has noticed an improvement since starting amlodipine.  We also got a 7-day heart monitor that showed no arrhythmias.    The patient does  not symptoms concerning for COVID-19 infection (fever, chills, cough, or new SHORTNESS OF BREATH).    Prior CV studies:   The following studies were reviewed today:  Lexiscan Myoview 05/28/15:  Nuclear stress EF: 60%.  No T wave inversion was noted during stress.  There was no ST segment deviation noted during stress.  Defect 1: There is a small defect of mild severity.  This is a low risk study.  Small, mild fixed inferolateral defect, likely artifact. No reversible ischemia. LVEF 60% with normal wall motion. This  is a low risk study.  Echo 02/06/12: LVEF 55-60%.  Mild mitral regurgitation.    7 Day Event Monitor 05/2018:  Quality: Fair.  Baseline artifact. Predominant rhythm: sinus rhythm with first degree AV block Average heart rate: 69 bpm Max heart rate: 118 bpm Min heart rate: 54 bpm Pauses >2.5 seconds: none  No arrhythmias   Past Medical History:  Diagnosis Date   Ascending aortic aneurysm (HCC)    4.2 cm by CT 08/2015.  Repeat in 1 year.   Asthma    Coronary artery disease    s/p Rotablator to mid RCA in 2/12   Dyslipidemia    Fatigue    GERD (gastroesophageal reflux disease)    Heart attack ([place])    1986   Hypertension    PONV (postoperative nausea and vomiting)    Rectal polyp    hyplastic   Varicose vein    Weakness    Past Surgical History:  Procedure Laterality Date   ABDOMINAL ANGIOGRAM  02/12/2012   Procedure: ABDOMINAL ANGIOGRAM;  Surgeon: Peter M Martinique, MD;  Location: [facility] CATH LAB;  Service: Cardiovascular;;   ABDOMINAL HYSTERECTOMY  1996   total w/BSO   CATARACT EXTRACTION     june 2016, july 2016   CORONARY ANGIOPLASTY  2/12   Rotablator to the RCA   CORONARY STENT PLACEMENT     ETHMOIDECTOMY Left 09/03/2017   Procedure: ETHMOIDECTOMY;  Surgeon: Clista Bernhardt, MD;  Location: [place];  Service: Ophthalmology;  Laterality: Left;   HARDWARE REMOVAL  02/21/2011   Procedure: HARDWARE REMOVAL;  Surgeon: Laurice Record A[place]ton;  Location: [place];  Service: Orthopedics;  Laterality: Left;  REMOVAL TWO SCREWS DISTAL LEFT TIBIA    LACRIMAL DUCT EXPLORATION Left 09/03/2017   Procedure: LACRIMAL DUCT EXPLORATION;  Surgeon: Clista Bernhardt, MD;  Location: [place];  Service: Ophthalmology;  Laterality: Left;   LEFT HEART CATHETERIZATION WITH CORONARY ANGIOGRAM N/A 02/12/2012   Procedure: LEFT HEART CATHETERIZATION WITH CORONARY ANGIOGRAM;  Surgeon: Peter M Martinique, MD;  Location: [facility] CATH LAB;  Service: Cardiovascular;   Laterality: N/A;   LEG SURGERY Left    rod, pins, screws    ORIF TIBIA & FIBULA FRACTURES  2000   polyp removal     SPINE SURGERY     2015     Current Meds  Medication Sig   ALPRAZolam (XANAX) 0.25 MG tablet Take 0.25 mg by mouth 2 (two) times daily as needed for anxiety.   amLODipine (NORVASC) 10 MG tablet Take 1 tablet (10 mg total) by mouth daily.   aspirin EC 81 MG tablet Take 81 mg by mouth daily.   carvedilol (COREG) 12.5 MG tablet Take 1 tablet (12.5 mg total) by mouth 2 (two) times daily.   furosemide (LASIX) 20 MG tablet 1 TABLET BY MOUTH ONCE A WEEK (Patient taking differently: Take 20 mg by mouth once a week. 1 TABLET BY MOUTH ONCE A WEEK ON SATURDAYS)  levothyroxine (SYNTHROID, LEVOTHROID) 25 MCG tablet Take 25 mcg by mouth daily before breakfast.   lisinopril (PRINIVIL,ZESTRIL) 40 MG tablet Take 1 tablet (40 mg total) by mouth daily.   loratadine (CLARITIN) 10 MG tablet Take 10 mg by mouth daily.   nitroGLYCERIN (NITROSTAT) 0.4 MG SL tablet Place 1 tablet (0.4 mg total) under the tongue every 5 (five) minutes as needed. For chest pain   [DISCONTINUED] ezetimibe (ZETIA) 10 MG tablet Take 1 tablet (10 mg total) daily by mouth.     Allergies:   Morphine; Phenergan [promethazine hcl]; and Imdur [isosorbide dinitrate]   Social History   Tobacco Use   Smoking status: Never Smoker   Smokeless tobacco: Never Used  Substance Use Topics   Alcohol use: No    Alcohol/week: 0.0 standard drinks   Drug use: No     Family Hx: The patient's family history includes Cancer in her daughter and sister; Heart attack in her brother, brother, brother, father, mother, and sister; Heart disease in her brother, daughter, father, mother, and son; Hyperlipidemia in her daughter; Varicose Veins in her daughter. There is no history of Colon cancer.  ROS:   Please see the history of present illness.     All other systems reviewed and are negative.   Labs/Other Tests and  Data Reviewed:    Recent Labs: 09/03/2017: Hemoglobin 13.2; Platelets 209 01/12/2018: ALT 12; BUN 14; Creatinine, Ser 0.64; Potassium 4.7; Sodium 142   Recent Lipid Panel Lab Results  Component Value Date/Time   CHOL 295 (H) 01/12/2018 10:05 AM   TRIG 154 (H) 01/12/2018 10:05 AM   HDL 55 01/12/2018 10:05 AM   CHOLHDL 5.4 (H) 01/12/2018 10:05 AM   CHOLHDL 6.2 (H) 05/30/2016 09:00 AM   LDLCALC 209 (H) 01/12/2018 10:05 AM   LDLDIRECT 215.3 04/05/2013 12:42 PM    Wt Readings from Last 3 Encounters:  07/08/18 167 lb 3.2 oz (75.8 kg)  01/26/18 174 lb (78.9 kg)  12/17/17 175 lb 6.4 oz (79.6 kg)     Exam:    Vital Signs:  BP (!) 129/102    Pulse 79    Ht 5\' 6"  (1.676 m)    Wt 167 lb 3.2 oz (75.8 kg)    BMI 26.99 kg/m    Gen: No acute stress.  Anxious and tearful at times Resp: breathing non-labored Neuro: Speech fluent Psych: AOx3  ASSESSMENT & PLAN:    # Hypertension: Blood pressure continues to be mostly above goal.  We will add hydralazine 25 mg twice daily.  Continue amlodipine, carvedilol, Lasix, and lisinopril.  # CAD s/p MI and RCA PCI:  # Chest pain:  Chest pain resolved.  Continue aspirin, amlodipine, carvedilol and starting Repatha today.    # Shortness of breath:  # Tachycardia:  No abnormalities noted on 7-day event monitor.  This is likely due to anxiety and stress from caring for her husband with dementia.  Continue carvedilol.  # Hyperlipidemia:  Ms. Melgarejo cholesterol is >100 despite being on atorvastatin 80 mg daily and Zetia.  She will start [place] today.  She wants to stop Zetia.  Will repeat lipids and a CMP in 6 to 8 weeks.  # Ascending aorta aneurysm: 4.2 cm on CT 08/2015.   4.3 cm on echo 08/28/17.  Continue carvedilol.    COVID-19 Education: The signs and symptoms of COVID-19 were discussed with the patient and how to seek care for testing (follow up with PCP or arrange E-visit).  The importance of social  distancing was discussed today.  Patient  Risk:   After full review of this patients clinical status, I feel that they are at least moderate risk at this time.  Time:   Today, I have spent 17 minutes with the patient with telehealth technology discussing chest pain and hypertension.     Medication Adjustments/Labs and Tests Ordered: Current medicines are reviewed at length with the patient today.  Concerns regarding medicines are outlined above.  Tests Ordered: Orders Placed This Encounter  Procedures   Comprehensive metabolic panel   Lipid panel   Medication Changes: Meds ordered this encounter  Medications   hydrALAZINE (APRESOLINE) 25 MG tablet    Sig: Take 1 tablet (25 mg total) by mouth 2 (two) times a day.    Dispense:  180 tablet    Refill:  1    Disposition: Follow up with Mattthew Ziomek C. Oval Linsey, MD, [facility] in 6 months.  F/u with PharmD in 1 month.  Signed, Skeet Latch, MD  07/08/2018 12:00 PM    [place]

## 2018-09-14 ENCOUNTER — Other Ambulatory Visit: Payer: Medicare Other

## 2018-09-22 LAB — COMPREHENSIVE METABOLIC PANEL
ALT: 13 IU/L (ref 0–32)
AST: 19 IU/L (ref 0–40)
Albumin/Globulin Ratio: 2.4 — ABNORMAL HIGH (ref 1.2–2.2)
Albumin: 4.7 g/dL (ref 3.7–4.7)
Alkaline Phosphatase: 74 IU/L (ref 39–117)
BUN/Creatinine Ratio: 13 (ref 12–28)
BUN: 11 mg/dL (ref 8–27)
Bilirubin Total: 0.3 mg/dL (ref 0.0–1.2)
CO2: 22 mmol/L (ref 20–29)
Calcium: 10.1 mg/dL (ref 8.7–10.3)
Chloride: 102 mmol/L (ref 96–106)
Creatinine, Ser: 0.83 mg/dL (ref 0.57–1.00)
GFR calc Af Amer: 78 mL/min/{1.73_m2} (ref >59)
GFR calc non Af Amer: 68 mL/min/{1.73_m2} (ref >59)
Globulin, Total: 2 g/dL (ref 1.5–4.5)
Glucose: 102 mg/dL — ABNORMAL HIGH (ref 65–99)
Potassium: 4.8 mmol/L (ref 3.5–5.2)
Sodium: 141 mmol/L (ref 134–144)
Total Protein: 6.7 g/dL (ref 6.0–8.5)

## 2018-09-22 LAB — LIPID PANEL
Chol/HDL Ratio: 5.7 ratio — ABNORMAL HIGH (ref 0.0–4.4)
Cholesterol, Total: 312 mg/dL — ABNORMAL HIGH (ref 100–199)
HDL: 55 mg/dL (ref >39)
LDL Calculated: 219 mg/dL — ABNORMAL HIGH (ref 0–99)
Triglycerides: 189 mg/dL — ABNORMAL HIGH (ref 0–149)
VLDL Cholesterol Cal: 38 mg/dL (ref 5–40)

## 2018-10-07 ENCOUNTER — Telehealth: Payer: Self-pay | Admitting: Pharmacist

## 2018-10-07 DIAGNOSIS — E7800 Pure hypercholesterolemia, unspecified: Secondary | ICD-10-CM

## 2018-10-07 MED ORDER — ATORVASTATIN CALCIUM 80 MG PO TABS: 80.0000 mg | ORAL_TABLET | Freq: Every day | ORAL | 0 refills | Status: DC

## 2018-10-07 NOTE — Telephone Encounter (Signed)
Noted LDL-c not improved on Repatha. Patient stopped using zetia and atorvastatin.  Recommendation:  1. Continue Repatha 140mg  every 14 days 2. Resume atorvastatin 80mg  every evening 3. Repeat fastin lipid panle in 4 weeks 4. Will consider referral to Lipidologist if LDL not improved after 1 months on atorvastatin plus Repatha   *Orders in Epic and copy mailed to patient as well*

## 2018-12-10 ENCOUNTER — Other Ambulatory Visit (HOSPITAL_COMMUNITY): Payer: Self-pay | Admitting: Otolaryngology

## 2018-12-10 ENCOUNTER — Other Ambulatory Visit: Payer: Self-pay | Admitting: Otolaryngology

## 2018-12-10 DIAGNOSIS — H6983 Other specified disorders of Eustachian tube, bilateral: Secondary | ICD-10-CM

## 2018-12-10 DIAGNOSIS — H6993 Unspecified Eustachian tube disorder, bilateral: Secondary | ICD-10-CM

## 2018-12-10 DIAGNOSIS — H9202 Otalgia, left ear: Secondary | ICD-10-CM

## 2018-12-10 DIAGNOSIS — R519 Headache, unspecified: Secondary | ICD-10-CM

## 2018-12-17 ENCOUNTER — Ambulatory Visit: Payer: Medicare Other

## 2018-12-24 ENCOUNTER — Other Ambulatory Visit: Payer: Self-pay

## 2018-12-24 ENCOUNTER — Ambulatory Visit
Admission: RE | Admit: 2018-12-24 | Discharge: 2018-12-24 | Disposition: A | Discharge status: Home / Self Care | Payer: Medicare Other | Source: Ambulatory Visit | Attending: Otolaryngology | Admitting: Otolaryngology

## 2018-12-24 DIAGNOSIS — H9202 Otalgia, left ear: Secondary | ICD-10-CM | POA: Insufficient documentation

## 2018-12-24 DIAGNOSIS — R519 Headache, unspecified: Secondary | ICD-10-CM | POA: Diagnosis present

## 2018-12-24 DIAGNOSIS — H6983 Other specified disorders of Eustachian tube, bilateral: Secondary | ICD-10-CM | POA: Diagnosis not present

## 2019-01-18 ENCOUNTER — Ambulatory Visit: Payer: Medicare Other | Admitting: Cardiovascular Disease

## 2019-03-18 ENCOUNTER — Other Ambulatory Visit: Payer: Self-pay

## 2019-03-18 ENCOUNTER — Encounter: Payer: Self-pay | Admitting: Cardiovascular Disease

## 2019-03-18 ENCOUNTER — Ambulatory Visit (INDEPENDENT_AMBULATORY_CARE_PROVIDER_SITE_OTHER): Payer: Medicare Other | Admitting: Cardiovascular Disease

## 2019-03-18 VITALS — BP 122/89 | HR 89 | Temp 97.2°F | Ht 66.0 in | Wt 174.0 lb

## 2019-03-18 DIAGNOSIS — I2510 Atherosclerotic heart disease of native coronary artery without angina pectoris: Secondary | ICD-10-CM

## 2019-03-18 DIAGNOSIS — E039 Hypothyroidism, unspecified: Secondary | ICD-10-CM | POA: Diagnosis not present

## 2019-03-18 DIAGNOSIS — I10 Essential (primary) hypertension: Secondary | ICD-10-CM | POA: Diagnosis not present

## 2019-03-18 DIAGNOSIS — Z5181 Encounter for therapeutic drug level monitoring: Secondary | ICD-10-CM | POA: Diagnosis not present

## 2019-03-18 LAB — TSH: TSH: 3.6 u[IU]/mL (ref 0.450–4.500)

## 2019-03-18 LAB — T4, FREE: Free T4: 1.14 ng/dL (ref 0.82–1.77)

## 2019-03-18 MED ORDER — AMLODIPINE BESYLATE 2.5 MG PO TABS: 2.5000 mg | ORAL_TABLET | Freq: Every day | ORAL | 3 refills | Status: DC

## 2019-03-18 NOTE — Patient Instructions (Signed)
Medication Instructions:  START amlodipine 2.5 mg (1 tablet) daily *If you need a refill on your cardiac medications before your next appointment, please call your pharmacy*  Lab Work: TSH, free T4) If you have labs (blood work) drawn today and your tests are completely normal, you will receive your results only by: Lindsey Middleton MyChart Message (if you have MyChart) OR . A paper copy in the mail If you have any lab test that is abnormal or we need to change your treatment, we will call you to review the results.  Testing/Procedures: NONE  Follow-Up: At [facility], you and your health needs are our priority.  As part of our continuing mission to provide you with exceptional heart care, we have created designated Provider Care Teams.  These Care Teams include your primary Cardiologist (physician) and Advanced Practice Providers (APPs -  Physician Assistants and Nurse Practitioners) who all work together to provide you with the care you need, when you need it.  Your next appointment:   6 week(s)  The format for your next appointment:   Virtual Visit   Provider:   Skeet Latch, MD  Other Instructions Your physician also wants you to follow-up in: 6 months. You will receive a reminder letter in the mail two months in advance. If you don't receive a letter, please call our office to schedule the follow-up appointment.

## 2019-03-18 NOTE — Progress Notes (Signed)
Office Visit  Date:  03/18/2019   ID:  Lindsey Middleton, DOB [birth year], MRN [id number]  Patient Location:  [place] [address]   Provider location:   [facility]  PCP:  Thea Alken  Cardiologist:  Skeet Latch, MD  Electrophysiologist:  None   Chief Complaint:    History of Present Illness:    Lindsey Middleton is a 79 y.o. female CAD, hypertension, hyperlipidemia, mild ascending aortic aneurysm, and syncope who presents for follow up. Lindsey Middleton was previously a patient of [name]. She had an MI in 1986 and in 2012 that was treated with DES to the mid RCA. She had a cath in 2013 at which time the stent was patent and she had non-obstructive CAD. She had a [place] Myoview4/2017 as a pre-op clearance and it was low risk for ischemia. She last saw [name] 07/2015. At that appointment her blood pressure was not well-controlled so lisinopril was increased to 20 mg daily. Her blood pressure was still poorly controlled on 09/14/15. Imdur was added to her regimen due to chest discomfort and poorly controlled hypertension. She was also noted to be tachycardic and reported increased shortness of breath as well as lower extremity edema. Therefore, she was referred for lower extremity Dopplers that were negative for DVT. She had a chest CT-A on 09/17/15 that was negative for pulmonary embolism. It showed moderate coronary artery calcifications and a mild ascending aortic aneurysm. Her lipids have been poorly-controlled. Her LDL has been over 170 on atorvastatin 80 mg daily so Zetia was added to her regimen. Her lipids remained elevated so she was started on Repatha.  Lindsey Middleton had anecho7/2019to followup on her ascending aorta aneurysm. It was stable at 4.3 cm. LVEF was [id number] grade 1 diastolic dysfunction. She wore a 7-day heart monitor that showed no arrhythmias.  Hydralazine was added at her last appointment.  Her blood pressure became low.   She reports calling our office and was told to stop amlodipine.  However I do not see documentation of this.  Since then her blood pressure has been running mostly high.  She is unsure the exact numbers.  She is alert for an eye procedure last week and it became extremely elevated to the point where they had to give her IV hydralazine.  In the last couple days it has been in the 130s to 140s.  She denies any headaches or vision changes.  She continues taking Repatha injections without any problems.  When she last saw her PCP and levothyroxine was adjusted.  She has been unable to get her labs checked since then when her PCP 2/2 COVID-19.  She has not been getting much exercise.  She has been stressed lately because her daughter moved away and she continues to care for her husband whose dementia is progressing.  It is hard for her to get much sleep at night because she has to remind him to keep his BiPAP machine on.  The patient does not symptoms concerning for COVID-19 infection (fever, chills, cough, or new SHORTNESS OF BREATH).    Prior CV studies:   The following studies were reviewed today:  Lexiscan Myoview4/3/17:  Nuclear stress EF: 60%.  No T wave inversion was noted during stress.  There was no ST segment deviation noted during stress.  Defect 1: There is a small defect of mild severity.  This is a low risk study.  Small, mild fixed inferolateral defect, likely artifact. No  reversible ischemia. LVEF 60% with normal wall motion. This is a low risk study.  Echo 02/06/12: LVEF 55-60%. Mild mitral regurgitation.   7 Day Event Monitor 05/2018:  Quality: Fair. Baseline artifact. Predominant rhythm: sinus rhythm with first degree AV block Average heart rate: 69 bpm Max heart rate: 118 bpm Min heart rate: 54 bpm Pauses >2.5 seconds: none  No arrhythmias  Past Medical History:  Diagnosis Date  . Ascending aortic aneurysm (HCC)    4.2 cm by CT 08/2015.  Repeat in 1 year.    . Asthma   . Coronary artery disease    s/p Rotablator to mid RCA in 2/12  . Dyslipidemia   . Fatigue   . GERD (gastroesophageal reflux disease)   . Heart attack ([place])    1986  . Hypertension   . PONV (postoperative nausea and vomiting)   . Rectal polyp    hyplastic  . Varicose vein   . Weakness    Past Surgical History:  Procedure Laterality Date  . ABDOMINAL ANGIOGRAM  02/12/2012   Procedure: ABDOMINAL ANGIOGRAM;  Surgeon: Peter M Martinique, MD;  Location: [facility] CATH LAB;  Service: Cardiovascular;;  . ABDOMINAL HYSTERECTOMY  1996   total w/BSO  . CATARACT EXTRACTION     june 2016, july 2016  . CORONARY ANGIOPLASTY  2/12   Rotablator to the RCA  . CORONARY STENT PLACEMENT    . ETHMOIDECTOMY Left 09/03/2017   Procedure: ETHMOIDECTOMY;  Surgeon: Clista Bernhardt, MD;  Location: [place];  Service: Ophthalmology;  Laterality: Left;  . HARDWARE REMOVAL  02/21/2011   Procedure: HARDWARE REMOVAL;  Surgeon: Laurice Record A[place];  Location: ;  Service: Orthopedics;  Laterality: Left;  REMOVAL TWO SCREWS DISTAL LEFT TIBIA   . LACRIMAL DUCT EXPLORATION Left 09/03/2017   Procedure: LACRIMAL DUCT EXPLORATION;  Surgeon: Clista Bernhardt, MD;  Location: [place];  Service: Ophthalmology;  Laterality: Left;  . LEFT HEART CATHETERIZATION WITH CORONARY ANGIOGRAM N/A 02/12/2012   Procedure: LEFT HEART CATHETERIZATION WITH CORONARY ANGIOGRAM;  Surgeon: Peter M Martinique, MD;  Location: [facility] CATH LAB;  Service: Cardiovascular;  Laterality: N/A;  . LEG SURGERY Left    rod, pins, screws   . ORIF TIBIA & FIBULA FRACTURES  2000  . polyp removal    . SPINE SURGERY     2015     Current Meds  Medication Sig  . ALPRAZolam (XANAX) 0.25 MG tablet Take 0.25 mg by mouth 2 (two) times daily as needed for anxiety.  [place] aspirin EC 81 MG tablet Take 81 mg by mouth daily.  . carvedilol (COREG) 12.5 MG tablet Take 1 tablet (12.5 mg total) by mouth 2 (two) times daily.  . Evolocumab (REPATHA  SURECLICK) [id number] MG/ML SOAJ Inject 140 mg into the skin every 14 (fourteen) days.  . furosemide (LASIX) 20 MG tablet 1 TABLET BY MOUTH ONCE A WEEK (Patient taking differently: Take 20 mg by mouth once a week. 1 TABLET BY MOUTH ONCE A WEEK ON SATURDAYS)  . levothyroxine (SYNTHROID, LEVOTHROID) 25 MCG tablet Take 25 mcg by mouth daily before breakfast.  . loratadine (CLARITIN) 10 MG tablet Take 10 mg by mouth daily.  . nitroGLYCERIN (NITROSTAT) 0.4 MG SL tablet Place 1 tablet (0.4 mg total) under the tongue every 5 (five) minutes as needed. For chest pain  . [DISCONTINUED] amLODipine (NORVASC) 10 MG tablet Take 1 tablet (10 mg total) by mouth daily.     Allergies:   Morphine, Phenergan [promethazine hcl], and  Imdur [isosorbide dinitrate]   Social History   Tobacco Use  . Smoking status: Never Smoker  . Smokeless tobacco: Never Used  Substance Use Topics  . Alcohol use: No    Alcohol/week: 0.0 standard drinks  . Drug use: No     Family Hx: The patient's family history includes Cancer in her daughter and sister; Heart attack in her brother, brother, brother, father, mother, and sister; Heart disease in her brother, daughter, father, mother, and son; Hyperlipidemia in her daughter; Varicose Veins in her daughter. There is no history of Colon cancer.  ROS:   Please see the history of present illness.     All other systems reviewed and are negative.   Labs/Other Tests and Data Reviewed:    Recent Labs: 09/21/2018: ALT 13; BUN 11; Creatinine, Ser 0.83; Potassium 4.8; Sodium 141   Recent Lipid Panel Lab Results  Component Value Date/Time   CHOL 312 (H) 09/21/2018 12:31 PM   TRIG 189 (H) 09/21/2018 12:31 PM   HDL 55 09/21/2018 12:31 PM   CHOLHDL 5.7 (H) 09/21/2018 12:31 PM   CHOLHDL 6.2 (H) 05/30/2016 09:00 AM   LDLCALC 219 (H) 09/21/2018 12:31 PM   LDLDIRECT 215.3 04/05/2013 12:42 PM   11/22/18: Total cholesterol 153, triglycerides 166, HDL 60, LDL 60   Wt Readings from Last 3  Encounters:  03/18/19 174 lb (78.9 kg)  07/08/18 167 lb 3.2 oz (75.8 kg)  01/26/18 174 lb (78.9 kg)    EKG: 03/18/19: Sinus rhythm.  Rate 89 bpm.  Poor R wawve priogreion.  Cannot rule out prior inferior infarct.   Exam:    VS:  BP 122/89   Pulse 89   Temp (!) 97.2 F (36.2 C)   Ht 5\' 6"  (1.676 m)   Wt 174 lb (78.9 kg)   SpO2 96%   BMI 28.08 kg/m  , BMI Body mass index is 28.08 kg/m. GENERAL:  Well appearing HEENT: Pupils equal round and reactive, fundi not visualized, oral mucosa unremarkable NECK:  No jugular venous distention, waveform within normal limits, carotid upstroke brisk and symmetric, no bruits LUNGS:  Clear to auscultation bilaterally HEART:  RRR.  PMI not displaced or sustained,S1 and S2 within normal limits, no S3, no S4, no clicks, no rubs, no murmurs ABD:  Flat, positive bowel sounds normal in frequency in pitch, no bruits, no rebound, no guarding, no midline pulsatile mass, no hepatomegaly, no splenomegaly EXT:  2 plus pulses throughout, no edema, no cyanosis no clubbing SKIN:  No rashes no nodules NEURO:  Cranial nerves II through XII grossly intact, motor grossly intact throughout PSYCH:  Cognitively intact, oriented to person place and time   ASSESSMENT & PLAN:    # Hypertension: Blood pressure is above goal both here and at home.  Her blood pressure became too low when she was on amlodipine 10 mg.  We will start 2.5 mg.  Continue carvedilol, hydralazine, and lisinopril.  She will continue to track her blood pressure at home.  # CAD s/p MI and RCA PCI: # Chest pain:  Chest pain resolved.  Continue aspirin, carvedilol and Repatha.  Start amlodipine as above.  # Shortness of breath:  # Tachycardia:  No abnormalities noted on 7-day event monitor.  This is likely due to anxiety and stress from caring for her husband with dementia.  Continue carvedilol.  # Hyperlipidemia:  Lipids well-controlled on Repatha.  # Ascending aorta aneurysm:4.2 cm on CT  08/2015. 4.3 cm on echo 08/28/17. Continue carvedilol.  Time:   Today, I have spent 25 minutes with the patient with telehealth technology discussing chest pain and hypertension.     Medication Adjustments/Labs and Tests Ordered: Current medicines are reviewed at length with the patient today.  Concerns regarding medicines are outlined above.    Tests Ordered: Orders Placed This Encounter  Procedures  . TSH  . T4, free  . EKG 12-Lead   Medication Changes: Meds ordered this encounter  Medications  . amLODipine (NORVASC) 2.5 MG tablet    Sig: Take 1 tablet (2.5 mg total) by mouth daily.    Dispense:  90 tablet    Refill:  3    Disposition: Follow up with Maci Eickholt C. Oval Linsey, MD, [facility] in 6 weeks virtually.  6 months in office.  Signed, Skeet Latch, MD  03/18/2019 12:09 PM    [place]

## 2019-04-25 ENCOUNTER — Telehealth: Payer: Self-pay | Admitting: Cardiovascular Disease

## 2019-04-25 NOTE — Telephone Encounter (Signed)
Pt wanted to know if her grant for her Repatha had ended. She said it would cost her $220 when she went to the Pharmacy last week. She does not need the medication until next week

## 2019-04-25 NOTE — Telephone Encounter (Signed)
Please advise of any information for this?   Thank you!

## 2019-04-26 NOTE — Telephone Encounter (Signed)
lmomed the pt to contact the [employer] directly

## 2019-04-27 ENCOUNTER — Telehealth: Payer: Self-pay | Admitting: Cardiovascular Disease

## 2019-04-27 ENCOUNTER — Telehealth (INDEPENDENT_AMBULATORY_CARE_PROVIDER_SITE_OTHER): Payer: Medicare Other | Admitting: Cardiovascular Disease

## 2019-04-27 ENCOUNTER — Encounter: Payer: Self-pay | Admitting: Cardiovascular Disease

## 2019-04-27 VITALS — BP 142/91 | HR 82 | Ht 66.0 in | Wt 171.0 lb

## 2019-04-27 DIAGNOSIS — I7121 Aneurysm of the ascending aorta, without rupture: Secondary | ICD-10-CM

## 2019-04-27 DIAGNOSIS — I2510 Atherosclerotic heart disease of native coronary artery without angina pectoris: Secondary | ICD-10-CM | POA: Diagnosis not present

## 2019-04-27 DIAGNOSIS — I10 Essential (primary) hypertension: Secondary | ICD-10-CM | POA: Diagnosis not present

## 2019-04-27 DIAGNOSIS — E785 Hyperlipidemia, unspecified: Secondary | ICD-10-CM

## 2019-04-27 DIAGNOSIS — I712 Thoracic aortic aneurysm, without rupture: Secondary | ICD-10-CM

## 2019-04-27 MED ORDER — AMLODIPINE BESYLATE 5 MG PO TABS: 5.0000 mg | ORAL_TABLET | Freq: Every day | ORAL | 3 refills | Status: DC

## 2019-04-27 MED ORDER — HYDRALAZINE HCL 50 MG PO TABS: 50.0000 mg | ORAL_TABLET | Freq: Two times a day (BID) | ORAL | 1 refills | Status: DC

## 2019-04-27 NOTE — Progress Notes (Signed)
Virtual Visit via Telephone Note    Evaluation Performed:  Follow-up visit  This visit type was conducted due to national recommendations for restrictions regarding the COVID-19 Pandemic (e.g. social distancing).  This format is felt to be most appropriate for this patient at this time.  All issues noted in this document were discussed and addressed.  No physical exam was performed (except for noted visual exam findings with Video Visits).  Please refer to the patient's chart (MyChart message for video visits and phone note for telephone visits) for the patient's consent to telehealth for [facility].  Date:  04/27/2019   ID:  Lindsey Middleton, DOB 1941-01-14, MRN [id number]  Patient Location:  [place] [address]   Provider location:   [facility]  PCP:  Thea Alken  Cardiologist:  Skeet Latch, MD  Electrophysiologist:  None   Chief Complaint:    History of Present Illness:    Lindsey Middleton is a 79 y.o. female who CAD, hypertension, hyperlipidemia, mild ascending aortic aneurysm, and syncope who presents for follow up. Lindsey Middleton was previously a patient of [name]. She had an MI in 1986 and in 2012 that was treated with DES to the mid RCA. She had a cath in 2013 at which time the stent was patent and she had non-obstructive CAD. She had a [place] Myoview4/2017 as a pre-op clearance and it was low risk for ischemia. She last saw [name] 07/2015. At that appointment her blood pressure was not well-controlled so lisinopril was increased to 20 mg daily. Her blood pressure was still poorly controlled on 09/14/15. Imdur was added to her regimen due to chest discomfort and poorly controlled hypertension. She was also noted to be tachycardic and reported increased shortness of breath as well as lower extremity edema. Therefore, she was referred for lower extremity Dopplers that were negative for DVT. She had a chest CT-A on 09/17/15 that was  negative for pulmonary embolism. It showed moderate coronary artery calcifications and a mild ascending aortic aneurysm. Her lipids have been poorly-controlled. Her LDL has been over 170 on atorvastatin 80 mg daily so Zetia was added to her regimen. Her lipids remained elevated so she was started on Repatha.  Lindsey Middleton had anecho7/2019to followup on her ascending aorta aneurysm. It was stable at 4.3 cm. LVEF was [id number] grade 1 diastolic dysfunction.She wore a 7-day heart monitor that showed no arrhythmias.Hydralazine was added.  Her blood pressure became low.  She reports calling our office and was told to stop amlodipine. At her last appointment she was started on amlodipine.  Since then her BP has been running in the 140s-150s/90s.  She is struggling with her husband who has worsening dementia.  This stressing her.  She isn't sleeping well at night because he keeps taking off his BiPAP.  She is very stressed as his only caregiver.  The patient does not symptoms concerning for COVID-19 infection (fever, chills, cough, or new SHORTNESS OF BREATH).    Prior CV studies:   The following studies were reviewed today:  [place] Myoview 05/28/15:  Nuclear stress EF: 60%.  No T wave inversion was noted during stress.  There was no ST segment deviation noted during stress.  Defect 1: There is a small defect of mild severity.  This is a low risk study.  Small, mild fixed inferolateral defect, likely artifact. No reversible ischemia. LVEF 60% with normal wall motion. This is a low risk study.  Echo  02/06/12: LVEF 55-60%.  Mild mitral regurgitation.    7 Day Event Monitor 05/2018:  Quality: Fair.  Baseline artifact. Predominant rhythm: sinus rhythm with first degree AV block Average heart rate: 69 bpm Max heart rate: 118 bpm Min heart rate: 54 bpm Pauses >2.5 seconds: none  No arrhythmias   Past Medical History:  Diagnosis Date  . Ascending aortic aneurysm (HCC)     4.2 cm by CT 08/2015.  Repeat in 1 year.  . Asthma   . Coronary artery disease    s/p Rotablator to mid RCA in 2/12  . Dyslipidemia   . Fatigue   . GERD (gastroesophageal reflux disease)   . Heart attack ([place])    1986  . Hypertension   . PONV (postoperative nausea and vomiting)   . Rectal polyp    hyplastic  . Varicose vein   . Weakness    Past Surgical History:  Procedure Laterality Date  . ABDOMINAL ANGIOGRAM  02/12/2012   Procedure: ABDOMINAL ANGIOGRAM;  Surgeon: Peter M Martinique, MD;  Location[facility] C[facility] LAB;  Service: Cardiovascular;;  . ABDOMINAL HYSTERECTOMY  1996   total w/BSO  . CATARACT EXTRACTION     june 2016, july 2016  . CORONARY ANGIOPLASTY  2/12   Rotablator to the RCA  . CORONARY STENT PLACEMENT    . ETHMOIDECTOMY Left 09/03/2017   Procedure: ETHMOIDECTOMY;  Surgeon: Clista Bernhardt, MD;  Location: [place];  Service: Ophthalmology;  Laterality: Left;  . HARDWARE REMOVAL  02/21/2011   Procedure: HARDWARE REMOVAL;  Surgeon: Laurice Record [place];  Location: [place];  Service: Orthopedics;  Laterality: Left;  REMOVAL TWO SCREWS DISTAL LEFT TIBIA   . LACRIMAL DUCT EXPLORATION Left 09/03/2017   Procedure: LACRIMAL DUCT EXPLORATION;  Surgeon: Clista Bernhardt, MD;  Location: [place];  Service: Ophthalmology;  Laterality: Left;  . LEFT HEART CATHETERIZATION WITH CORONARY ANGIOGRAM N/A 02/12/2012   Procedure: LEFT HEART CATHETERIZATION WITH CORONARY ANGIOGRAM;  Surgeon: Peter M Martinique, MD;  Location: [facility] CATH LAB;  Service: Cardiovascular;  Laterality: N/A;  . LEG SURGERY Left    rod, pins, screws   . ORIF TIBIA & FIBULA FRACTURES  2000  . polyp removal    . SPINE SURGERY     2015     Current Meds  Medication Sig  . ALPRAZolam (XANAX) 0.25 MG tablet Take 0.25 mg by mouth 2 (two) times daily as needed for anxiety.  Marland Kitchen amLODipine (NORVASC) 2.5 MG tablet Take 1 tablet (2.5 mg total) by mouth daily.  Marland Kitchen aspirin EC 81 MG tablet Take 81 mg by mouth daily.  .  carvedilol (COREG) 12.5 MG tablet Take 1 tablet (12.5 mg total) by mouth 2 (two) times daily.  . furosemide (LASIX) 20 MG tablet 1 TABLET BY MOUTH ONCE A WEEK (Patient taking differently: Take 20 mg by mouth once a week. 1 TABLET BY MOUTH ONCE A WEEK ON SATURDAYS)  . hydrALAZINE (APRESOLINE) 25 MG tablet Take 1 tablet (25 mg total) by mouth 2 (two) times a day.  . levothyroxine (SYNTHROID, LEVOTHROID) 25 MCG tablet Take 25 mcg by mouth daily before breakfast.  . lisinopril (PRINIVIL,ZESTRIL) 40 MG tablet Take 1 tablet (40 mg total) by mouth daily.  Marland Kitchen loratadine (CLARITIN) 10 MG tablet Take 10 mg by mouth daily.  . nitroGLYCERIN (NITROSTAT) 0.4 MG SL tablet Place 1 tablet (0.4 mg total) under the tongue every 5 (five) minutes as needed. For chest pain     Allergies:   Morphine,  Phenergan [promethazine hcl], and Imdur [isosorbide dinitrate]   Social History   Tobacco Use  . Smoking status: Never Smoker  . Smokeless tobacco: Never Used  Substance Use Topics  . Alcohol use: No    Alcohol/week: 0.0 standard drinks  . Drug use: No     Family Hx: The patient's family history includes Cancer in her daughter and sister; Heart attack in her brother, brother, brother, father, mother, and sister; Heart disease in her brother, daughter, father, mother, and son; Hyperlipidemia in her daughter; Varicose Veins in her daughter. There is no history of Colon cancer.  ROS:   Please see the history of present illness.     All other systems reviewed and are negative.   Labs/Other Tests and Data Reviewed:    Recent Labs: 09/21/2018: ALT 13; BUN 11; Creatinine, Ser 0.83; Potassium 4.8; Sodium 141 03/18/2019: TSH 3.600   Recent Lipid Panel Lab Results  Component Value Date/Time   CHOL 312 (H) 09/21/2018 12:31 PM   TRIG 189 (H) 09/21/2018 12:31 PM   HDL 55 09/21/2018 12:31 PM   CHOLHDL 5.7 (H) 09/21/2018 12:31 PM   CHOLHDL 6.2 (H) 05/30/2016 09:00 AM   LDLCALC 219 (H) 09/21/2018 12:31 PM    LDLDIRECT 215.3 04/05/2013 12:42 PM    Wt Readings from Last 3 Encounters:  04/27/19 171 lb (77.6 kg)  03/18/19 174 lb (78.9 kg)  07/08/18 167 lb 3.2 oz (75.8 kg)     Exam:    Vital Signs:  BP (!) 142/91   Pulse 82   Ht 5\' 6"  (1.676 m)   Wt 171 lb (77.6 kg)   BMI 27.60 kg/m    Gen: No acute stress.  Anxious and tearful at times Resp: breathing non-labored Neuro: Speech fluent Psych: AOx3  ASSESSMENT & PLAN:    # Hypertension: Blood pressure continues to be elevated.  Her blood pressure became too low when she was on amlodipine 10 mg.  However at 2.5 mg it is still too high.  We will increase amlodipine to 5mg .  Increase hydralazine to 50mg .  Continue carvedilol and lisinopril.  She will continue to track her blood pressure at home.  # CAD s/p MI and RCA PCI: # Chest pain: Chest pain resolved. Continue aspirin,carvediloland Repatha.  Increase amlodipine and hydralazine as above.  # Shortness of breath:  # Tachycardia: No abnormalities noted on 7-day event monitor.Thisis likelydue to anxiety and stress from caring for her husband with dementia. Continue carvedilol.  # Hyperlipidemia:  Lipids well-controlled on Repatha.  # Ascending aorta aneurysm:4.2 cm on CT 08/2015. 4.3 cm on echo 08/28/17. Continue carvedilol.  Repeat echo.    COVID-19 Education: The signs and symptoms of COVID-19 were discussed with the patient and how to seek care for testing (follow up with PCP or arrange E-visit).  The importance of social distancing was discussed today.  Patient Risk:   After full review of this patients clinical status, I feel that they are at least moderate risk at this time.  Time:   Today, I have spent 17 minutes with the patient with telehealth technology discussing chest pain and hypertension.     Medication Adjustments/Labs and Tests Ordered: Current medicines are reviewed at length with the patient today.  Concerns regarding medicines are outlined  above.  Tests Ordered: No orders of the defined types were placed in this encounter.  Medication Changes: No orders of the defined types were placed in this encounter.   Disposition: Follow up with Aaditya Letizia C. Oval Linsey,  MD, [facility] in 6 months.  F/u with PharmD in 1 month.  Signed, Skeet Latch, MD  04/27/2019 10:13 AM    [place]

## 2019-04-27 NOTE — Patient Instructions (Addendum)
Medication Instructions:  INCREASE YOUR AMLODIPINE TO 5 MG DAILY   INCREASE YOUR HYDRALAZINE TO 50 MG TWICE A DAY   *If you need a refill on your cardiac medications before your next appointment, please call your pharmacy*  Lab Work: NONE   If you have labs (blood work) drawn today and your tests are completely normal, you will receive your results only by: Marland Kitchen MyChart Message (if you have MyChart) OR . A paper copy in the mail If you have any lab test that is abnormal or we need to change your treatment, we will call you to review the results.  Testing/Procedures: Your physician has requested that you have an echocardiogram. Echocardiography is a painless test that uses sound waves to create images of your heart. It provides your doctor with information about the size and shape of your heart and how well your heart's chambers and valves are working. This procedure takes approximately one hour. There are no restrictions for this procedure. [facility] [address] 07/05/2019 AT 11:15 AM   Follow-Up: At [facility], you and your health needs are our priority.  As part of our continuing mission to provide you with exceptional heart care, we have created designated Provider Care Teams.  These Care Teams include your primary Cardiologist (physician) and Advanced Practice Providers (APPs -  Physician Assistants and Nurse Practitioners) who all work together to provide you with the care you need, when you need it.  We recommend signing up for the patient portal called "MyChart".  Sign up information is provided on this After Visit Summary.  MyChart is used to connect with patients for Virtual Visits (Telemedicine).  Patients are able to view lab/test results, encounter notes, upcoming appointments, etc.  Non-urgent messages can be sent to your provider as well.   To learn more about what you can do with MyChart, go to [email or web address].    Your next appointment:    07/05/2019 AT 2:20 PM WITH [facility]

## 2019-04-27 NOTE — Telephone Encounter (Signed)
Spoke with patient and reviewed med changes from today Scheduled Echo and follow up visit

## 2019-04-27 NOTE — Telephone Encounter (Signed)
   Pt is returning call from [place], she said she missed her call and wanted to speak with her.  Please call

## 2019-04-27 NOTE — Addendum Note (Signed)
Addended by: Alvina Filbert B on: 04/27/2019 11:43 AM   Modules accepted: Orders

## 2019-04-28 ENCOUNTER — Telehealth: Payer: Self-pay

## 2019-04-28 NOTE — Telephone Encounter (Signed)
-----   Message from [place], [facility] [place] [facility] sent at 04/28/2019  1:34 PM EST ----- Regarding: Please call patient  ----- Message ----- From: Earvin Hansen, LPN Sent: [id number]   1:31 PM EST To: Rockne Menghini, RPH-CPP, #  Hello This patient had vv yesterday and she is having issues with her Repatha. Can you call and see what is going on with that Thanks [place]

## 2019-04-28 NOTE — Telephone Encounter (Signed)
Spoke w/pt regarding the repatha and they stated the [employer] denied them so I instructed them to apply for repatha ready and to call us back if they have difficulties, pt voiced understanding

## 2019-04-28 NOTE — Telephone Encounter (Signed)
lmomed to call us back regarding repatha

## 2019-05-02 MED ORDER — ATORVASTATIN CALCIUM 80 MG PO TABS: 80.0000 mg | ORAL_TABLET | Freq: Every day | ORAL | 3 refills | Status: DC

## 2019-05-02 NOTE — Addendum Note (Signed)
Addended by: Rockne Menghini on: 05/02/2019 10:45 AM   Modules accepted: Orders

## 2019-05-02 NOTE — Telephone Encounter (Signed)
Patient unable to get assistance due to income, stocks, etc..  Would prefer to just go back on atorvastatin, as she is unwilling to pay high copays on PCSK-9 inhibitors.  Will re-start atorvastatin 80 mg qd.

## 2019-07-05 ENCOUNTER — Ambulatory Visit (INDEPENDENT_AMBULATORY_CARE_PROVIDER_SITE_OTHER): Payer: Medicare Other | Admitting: Cardiovascular Disease

## 2019-07-05 ENCOUNTER — Other Ambulatory Visit: Payer: Self-pay

## 2019-07-05 ENCOUNTER — Encounter: Payer: Self-pay | Admitting: Cardiovascular Disease

## 2019-07-05 ENCOUNTER — Other Ambulatory Visit (HOSPITAL_COMMUNITY): Payer: Medicare Other

## 2019-07-05 ENCOUNTER — Ambulatory Visit (HOSPITAL_COMMUNITY): Payer: Medicare Other | Attending: Cardiovascular Disease

## 2019-07-05 VITALS — BP 138/96 | HR 87 | Ht 66.0 in | Wt 177.4 lb

## 2019-07-05 DIAGNOSIS — I2510 Atherosclerotic heart disease of native coronary artery without angina pectoris: Secondary | ICD-10-CM

## 2019-07-05 DIAGNOSIS — I712 Thoracic aortic aneurysm, without rupture: Secondary | ICD-10-CM | POA: Diagnosis not present

## 2019-07-05 DIAGNOSIS — E785 Hyperlipidemia, unspecified: Secondary | ICD-10-CM | POA: Diagnosis not present

## 2019-07-05 DIAGNOSIS — I10 Essential (primary) hypertension: Secondary | ICD-10-CM

## 2019-07-05 DIAGNOSIS — E039 Hypothyroidism, unspecified: Secondary | ICD-10-CM

## 2019-07-05 DIAGNOSIS — I7121 Aneurysm of the ascending aorta, without rupture: Secondary | ICD-10-CM

## 2019-07-05 MED ORDER — HYDRALAZINE HCL 100 MG PO TABS: 100.0000 mg | ORAL_TABLET | Freq: Three times a day (TID) | ORAL | 1 refills | Status: DC

## 2019-07-05 NOTE — Patient Instructions (Addendum)
Medication Instructions:  INCREASE YOUR HYDRALAZINE TO 100 MG THREE TIMES A DAY   *If you need a refill on your cardiac medications before your next appointment, please call your pharmacy*  Lab Work: NONE  Testing/Procedures Your physician has requested that you have a renal artery duplex. During this test, an ultrasound is used to evaluate blood flow to the kidneys. Allow one hour for this exam. Do not eat after midnight the day before and avoid carbonated beverages. Take your medications as you usually do.  Follow-Up: At [facility], you and your health needs are our priority.  As part of our continuing mission to provide you with exceptional heart care, we have created designated Provider Care Teams.  These Care Teams include your primary Cardiologist (physician) and Advanced Practice Providers (APPs -  Physician Assistants and Nurse Practitioners) who all work together to provide you with the care you need, when you need it.  We recommend signing up for the patient portal called "MyChart".  Sign up information is provided on this After Visit Summary.  MyChart is used to connect with patients for Virtual Visits (Telemedicine).  Patients are able to view lab/test results, encounter notes, upcoming appointments, etc.  Non-urgent messages can be sent to your provider as well.   To learn more about what you can do with MyChart, go to [email or web address].    Your next appointment:   3 month(s)  The format for your next appointment:   Either In Person or Virtual  Provider:   You may see Skeet Latch, MD or one of the following Advanced Practice Providers on your designated Care Team:    Kerin Ransom, PA-C  [place], [place]  Coletta Memos, [place]  You have been referred to    Where: [place] Address: [place] [address] Phone: [phone]  If you do not hear from them within a week call them directly to schedule  appointment

## 2019-07-05 NOTE — Progress Notes (Signed)
Cardiology Office Note    Evaluation Performed:  Follow-up visit  Date:  07/05/2019   ID:  Lindsey Middleton, DOB [birth year], MRN [id number]  PCP:  Lindsey Bush, NP  Cardiologist:  Lindsey Latch, MD  Electrophysiologist:  None   Chief Complaint:    History of Present Illness:    Lindsey Middleton is a 79 y.o. female who CAD, hypertension, hyperlipidemia, mild ascending aortic aneurysm, and syncope who presents for follow up. Lindsey Middleton was previously a patient of Lindsey Middleton. She had an MI in 1986 and in 2012 that was treated with DES to the mid RCA. She had a cath in 2013 at which time the stent was patent and she had non-obstructive CAD. She had a Lindsey Middleton Myoview4/2017 as a pre-op clearance and it was low risk for ischemia. She last saw Lindsey Middleton 07/2015. At that appointment her blood pressure was not well-controlled so lisinopril was increased to 20 mg daily. Her blood pressure was still poorly controlled on 09/14/15. Imdur was added to her regimen due to chest discomfort and poorly controlled hypertension. She was also noted to be tachycardic and reported increased shortness of breath as well as lower extremity edema. Therefore, she was referred for lower extremity Dopplers that were negative for DVT. She had a chest CT-A on 09/17/15 that was negative for pulmonary embolism. It showed moderate coronary artery calcifications and a mild ascending aortic aneurysm. Her lipids have been poorly-controlled. Her LDL has been over 170 on atorvastatin 80 mg daily so Zetia was added to her regimen. Her lipids remained elevated so she was started on Repatha.  [name] had anecho7/2019to followup on her ascending aorta aneurysm. It was stable at 4.3 cm. LVEF was [id number] grade 1 diastolic dysfunction.She wore a 7-day heart monitor that showed no arrhythmias.Hydralazine was added.  Her blood pressure became low.  She reports calling our office and was told to stop amlodipine.  At  her last appointment her BP was elevated.  Amlodipine was increased to 5mg .  Her blood pressure continues to be elevated.  It has been as high as 190/100. It is worse when she is stressed.  On average it is around 150/90.  She has not chest pain.  She has some shortness of breath.  She had some gastroenteritis.  She has chronic mild edema in the left leg.  She denies orthopnea or PND.  Prior CV studies:   The following studies were reviewed today:  Lexiscan Myoview 05/28/15:  Nuclear stress EF: 60%.  No T wave inversion was noted during stress.  There was no ST segment deviation noted during stress.  Defect 1: There is a small defect of mild severity.  This is a low risk study.  Small, mild fixed inferolateral defect, likely artifact. No reversible ischemia. LVEF 60% with normal wall motion. This is a low risk study.  Echo 02/06/12: LVEF 55-60%.  Mild mitral regurgitation.    7 Day Event Monitor 05/2018:  Quality: Fair.  Baseline artifact. Predominant rhythm: sinus rhythm with first degree AV block Average heart rate: 69 bpm Max heart rate: 118 bpm Min heart rate: 54 bpm Pauses >2.5 seconds: none  No arrhythmias  07/05/19: 1. Left ventricular ejection fraction, by estimation, is 55 to 60%. The  left ventricle has normal function. The left ventricle has no regional  wall motion abnormalities. Left ventricular diastolic parameters are  consistent with Grade I diastolic  dysfunction (impaired relaxation).  2. Right ventricular systolic function is normal. The  right ventricular  size is normal. Tricuspid regurgitation signal is inadequate for assessing  PA pressure.  3. The mitral valve is degenerative. Trivial mitral valve regurgitation.  No evidence of mitral stenosis.  4. Focal calcification of the [place] is present. The aortic valve is  tricuspid. Aortic valve regurgitation is not visualized. Mild aortic valve  sclerosis is present, with no evidence of aortic valve  stenosis.  5. There is moderate dilatation of the ascending aorta measuring 43 mm.  6. The inferior vena cava is normal in size with greater than 50%  respiratory variability, suggesting right atrial pressure of 3 mmHg.   Comparison(s): No significant change from prior study. LV function  unchanged. Ascending aorta remains ~43 mm.    Past Medical History:  Diagnosis Date  . Ascending aortic aneurysm (HCC)    4.2 cm by CT 08/2015.  Repeat in 1 year.  . Asthma   . Coronary artery disease    s/p Rotablator to mid RCA in 2/12  . Dyslipidemia   . Fatigue   . GERD (gastroesophageal reflux disease)   . Heart attack ([place])    1986  . Hypertension   . PONV (postoperative nausea and vomiting)   . Rectal polyp    hyplastic  . Varicose vein   . Weakness    Past Surgical History:  Procedure Laterality Date  . ABDOMINAL ANGIOGRAM  02/12/2012   Procedure: ABDOMINAL ANGIOGRAM;  Surgeon: Peter M Martinique, MD;  Location: [facility] CATH LAB;  Service: Cardiovascular;;  . ABDOMINAL HYSTERECTOMY  1996   total w/BSO  . CATARACT EXTRACTION     june 2016, july 2016  . CORONARY ANGIOPLASTY  2/12   Rotablator to the RCA  . CORONARY STENT PLACEMENT    . ETHMOIDECTOMY Left 09/03/2017   Procedure: ETHMOIDECTOMY;  Surgeon: Clista Bernhardt, MD;  Location: [place];  Service: Ophthalmology;  Laterality: Left;  . HARDWARE REMOVAL  02/21/2011   Procedure: HARDWARE REMOVAL;  Surgeon: Laurice Record Aplington;  Location: [place];  Service: Orthopedics;  Laterality: Left;  REMOVAL TWO SCREWS DISTAL LEFT TIBIA   . LACRIMAL DUCT EXPLORATION Left 09/03/2017   Procedure: LACRIMAL DUCT EXPLORATION;  Surgeon: Clista Bernhardt, MD;  Location: [place];  Service: Ophthalmology;  Laterality: Left;  . LEFT HEART CATHETERIZATION WITH CORONARY ANGIOGRAM N/A 02/12/2012   Procedure: LEFT HEART CATHETERIZATION WITH CORONARY ANGIOGRAM;  Surgeon: Peter M Martinique, MD;  Location: [facility] CATH LAB;  Service: Cardiovascular;   Laterality: N/A;  . LEG SURGERY Left    rod, pins, screws   . ORIF TIBIA & FIBULA FRACTURES  2000  . polyp removal    . SPINE SURGERY     2015     Current Meds  Medication Sig  . ALPRAZolam (XANAX) 0.25 MG tablet Take 0.25 mg by mouth 2 (two) times daily as needed for anxiety.  Marland Kitchen amLODipine (NORVASC) 5 MG tablet Take 1 tablet (5 mg total) by mouth daily.  Marland Kitchen aspirin EC 81 MG tablet Take 81 mg by mouth daily.  Marland Kitchen atorvastatin (LIPITOR) 80 MG tablet Take 1 tablet (80 mg total) by mouth daily.  . carvedilol (COREG) 12.5 MG tablet Take 1 tablet (12.5 mg total) by mouth 2 (two) times daily.  . furosemide (LASIX) 20 MG tablet 1 TABLET BY MOUTH ONCE A WEEK (Patient taking differently: Take 20 mg by mouth once a week. 1 TABLET BY MOUTH ONCE A WEEK ON SATURDAYS)  . hydrALAZINE (APRESOLINE) 100 MG tablet Take 1 tablet (100  mg total) by mouth 3 (three) times daily.  Marland Kitchen levothyroxine (SYNTHROID, LEVOTHROID) 25 MCG tablet Take 25 mcg by mouth daily before breakfast.  . lisinopril (PRINIVIL,ZESTRIL) 40 MG tablet Take 1 tablet (40 mg total) by mouth daily.  Marland Kitchen loratadine (CLARITIN) 10 MG tablet Take 10 mg by mouth daily.  . nitroGLYCERIN (NITROSTAT) 0.4 MG SL tablet Place 1 tablet (0.4 mg total) under the tongue every 5 (five) minutes as needed. For chest pain  . [DISCONTINUED] hydrALAZINE (APRESOLINE) 50 MG tablet Take 1 tablet (50 mg total) by mouth in the morning and at bedtime.     Allergies:   Morphine, Phenergan [promethazine hcl], and Imdur [isosorbide dinitrate]   Social History   Tobacco Use  . Smoking status: Never Smoker  . Smokeless tobacco: Never Used  Substance Use Topics  . Alcohol use: No    Alcohol/week: 0.0 standard drinks  . Drug use: No     Family Hx: The patient's family history includes Cancer in her daughter and sister; Heart attack in her brother, brother, brother, father, mother, and sister; Heart disease in her brother, daughter, father, mother, and son; Hyperlipidemia  in her daughter; Varicose Veins in her daughter. There is no history of Colon cancer.  ROS:   Please see the history of present illness.     All other systems reviewed and are negative.   Labs/Other Tests and Data Reviewed:    Recent Labs: 09/21/2018: ALT 13; BUN 11; Creatinine, Ser 0.83; Potassium 4.8; Sodium 141 03/18/2019: TSH 3.600   Recent Lipid Panel Lab Results  Component Value Date/Time   CHOL 312 (H) 09/21/2018 12:31 PM   TRIG 189 (H) 09/21/2018 12:31 PM   HDL 55 09/21/2018 12:31 PM   CHOLHDL 5.7 (H) 09/21/2018 12:31 PM   CHOLHDL 6.2 (H) 05/30/2016 09:00 AM   LDLCALC 219 (H) 09/21/2018 12:31 PM   LDLDIRECT 215.3 04/05/2013 12:42 PM    Wt Readings from Last 3 Encounters:  07/05/19 177 lb 6.4 oz (80.5 kg)  04/27/19 171 lb (77.6 kg)  03/18/19 174 lb (78.9 kg)     Exam:    VS:  BP (!) 138/96   Pulse 87   Ht 5\' 6"  (1.676 m)   Wt 177 lb 6.4 oz (80.5 kg)   SpO2 94%   BMI 28.63 kg/m  , BMI Body mass index is 28.63 kg/m. GENERAL:  Well appearing HEENT: Pupils equal round and reactive, fundi not visualized, oral mucosa unremarkable NECK:  No jugular venous distention, waveform within normal limits, carotid upstroke brisk and symmetric, no bruits LUNGS:  Clear to auscultation bilaterally HEART:  RRR.  PMI not displaced or sustained,S1 and S2 within normal limits, no S3, no S4, no clicks, no rubs, no murmurs ABD:  Flat, positive bowel sounds normal in frequency in pitch, no bruits, no rebound, no guarding, no midline pulsatile mass, no hepatomegaly, no splenomegaly EXT:  2 plus pulses throughout, no edema, no cyanosis no clubbing SKIN:  No rashes no nodules NEURO:  Cranial nerves II through XII grossly intact, motor grossly intact throughout PSYCH:  Cognitively intact, oriented to person place and time   ASSESSMENT & PLAN:    # Hypertension: Blood pressure continues to be elevated.  Increase hydralazine to 100 mg tid.  Continue amlodipine, carvedilol and lisinopril.   We will check renal artery Dopplers to assess for renal artery stenosis.    # CAD s/p MI and RCA PCI: # Chest pain: Chest pain resolved. Continue aspirin,carvedilol, amlodipine,and Repatha.    #  Shortness of breath:  # Tachycardia: No abnormalities noted on 7-day event monitor.Thisis likelydue to anxiety and stress from caring for her husband with dementia. There is no evidence of heart failure on exam.  Continue carvedilol.  # Hyperlipidemia:  Lipids well-controlled on Repatha.  However she was unable to afford it.  Continue atorvastatin.   # Ascending aorta aneurysm:Stable.  4.2 cm on CT 08/2015. 4.3 cm on echo 08/28/17 and 05/2019. Continue carvedilol.     Medication Adjustments/Labs and Tests Ordered: Current medicines are reviewed at length with the patient today.  Concerns regarding medicines are outlined above.   Tests Ordered: Orders Placed This Encounter  Procedures  . Ambulatory referral to Internal Medicine  . VAS US RENAL ARTERY DUPLEX   Medication Changes: Meds ordered this encounter  Medications  . hydrALAZINE (APRESOLINE) 100 MG tablet    Sig: Take 1 tablet (100 mg total) by mouth 3 (three) times daily.    Dispense:  270 tablet    Refill:  1    NEW DOSE, D/C PREVIOUS RX    Disposition: Follow up with Heidie Krall C. Oval Linsey, MD, [facility] in 3 months  Signed, Lindsey Latch, MD  07/05/2019 11:07 PM    [place]

## 2019-07-07 ENCOUNTER — Encounter: Payer: Self-pay | Admitting: *Deleted

## 2019-07-11 ENCOUNTER — Other Ambulatory Visit: Payer: Self-pay | Admitting: Cardiovascular Disease

## 2019-07-11 DIAGNOSIS — R079 Chest pain, unspecified: Secondary | ICD-10-CM

## 2019-07-22 ENCOUNTER — Other Ambulatory Visit: Payer: Self-pay

## 2019-07-22 ENCOUNTER — Ambulatory Visit (HOSPITAL_COMMUNITY)
Admission: RE | Admit: 2019-07-22 | Discharge: 2019-07-22 | Disposition: A | Discharge status: Home / Self Care | Payer: Medicare Other | Source: Ambulatory Visit | Attending: Cardiovascular Disease | Admitting: Cardiovascular Disease

## 2019-07-22 DIAGNOSIS — I10 Essential (primary) hypertension: Secondary | ICD-10-CM | POA: Insufficient documentation

## 2019-10-12 ENCOUNTER — Encounter: Payer: Self-pay | Admitting: Cardiovascular Disease

## 2019-10-12 ENCOUNTER — Telehealth (INDEPENDENT_AMBULATORY_CARE_PROVIDER_SITE_OTHER): Payer: Medicare Other | Admitting: Cardiovascular Disease

## 2019-10-12 VITALS — BP 138/87 | HR 92 | Ht 66.0 in | Wt 168.0 lb

## 2019-10-12 DIAGNOSIS — I7121 Aneurysm of the ascending aorta, without rupture: Secondary | ICD-10-CM

## 2019-10-12 DIAGNOSIS — I2510 Atherosclerotic heart disease of native coronary artery without angina pectoris: Secondary | ICD-10-CM

## 2019-10-12 DIAGNOSIS — I712 Thoracic aortic aneurysm, without rupture: Secondary | ICD-10-CM | POA: Diagnosis not present

## 2019-10-12 DIAGNOSIS — I10 Essential (primary) hypertension: Secondary | ICD-10-CM

## 2019-10-12 DIAGNOSIS — E785 Hyperlipidemia, unspecified: Secondary | ICD-10-CM

## 2019-10-12 NOTE — Progress Notes (Signed)
Virtual Visit via Telephone Note   This visit type was conducted due to national recommendations for restrictions regarding the COVID-19 Pandemic (e.g. social distancing) in an effort to limit this patient's exposure and mitigate transmission in our community.  Due to her co-morbid illnesses, this patient is at least at moderate risk for complications without adequate follow up.  This format is felt to be most appropriate for this patient at this time.  The patient did not have access to video technology/had technical difficulties with video requiring transitioning to audio format only (telephone).  All issues noted in this document were discussed and addressed.  No physical exam could be performed with this format.  Please refer to the patient's chart for her  consent to telehealth for [facility].   The patient was identified using 2 identifiers.  Date:  10/12/2019   ID:  Lindsey Middleton, DOB 02-22-1941, MRN [id number]  Patient Location: Home Provider Location: Office/Clinic  PCP:  Ria Bush, NP  Cardiologist:  Skeet Latch, MD  Electrophysiologist:  None   Evaluation Performed:  Follow-Up Visit  Chief Complaint:  hypertension  History of Present Illness:    The patient does not have symptoms concerning for COVID-19 infection (fever, chills, cough, or new shortness of breath).    Lindsey Middleton is a 79 y.o. female who CAD, hypertension, hyperlipidemia, mild ascending aortic aneurysm, and syncope who presents for follow up. Lindsey Middleton was previously a patient of [name]. She had an MI in 1986 and in 2012 that was treated with DES to the mid RCA. She had a cath in 2013 at which time the stent was patent and she had non-obstructive CAD. She had a [place] Myoview4/2017 as a pre-op clearance and it was low risk for ischemia. She last saw [name] 07/2015. At that appointment her blood pressure was not well-controlled so lisinopril was increased to 20 mg daily. Her  blood pressure was still poorly controlled on 09/14/15. Imdur was added to her regimen due to chest discomfort and poorly controlled hypertension. She was also noted to be tachycardic and reported increased shortness of breath as well as lower extremity edema. Therefore, she was referred for lower extremity Dopplers that were negative for DVT. She had a chest CT-A on 09/17/15 that was negative for pulmonary embolism. It showed moderate coronary artery calcifications and a mild ascending aortic aneurysm. Her lipids have been poorly-controlled. Her LDL has been over 170 on atorvastatin 80 mg daily so Zetia was added to her regimen. Her lipids remained elevated so she was started on Repatha.  [name] had anecho7/2019to followup on her ascending aorta aneurysm. It was stable at 4.3 cm.  It was unchanged 06/2019. LVEF was 94-58%PFYT grade 1 diastolic dysfunction.She wore a 7-day heart monitor that showed no arrhythmias.Hydralazine was added.  Her blood pressure became low.  She reports calling our office and was told to stop amlodipine.  It was subsequently started start.  At her last appointment hydralazine was increased to 100mg  tid.  This has helped but her BP is still high sometimes.  It ranges from the 110s-140s/70-80s.  She has been under a lot of stress.  Her daughter had an MI in July.  They thought she wouldn't make it but she is now back living with her parents for rehab.  She is doing better.  [name] has been feeling well physically.  She gets dizzy when she first stands sometimes.  She notes that she isn't getting much  sleep.  She is up with [name] who gets up and talking in his sleep.  She is fearful of what he will do as he has dementia.  Prior CV studies:   The following studies were reviewed today:  Lexiscan Myoview 05/28/15:  Nuclear stress EF: 60%.  No T wave inversion was noted during stress.  There was no ST segment deviation noted during stress.  Defect 1: There is a  small defect of mild severity.  This is a low risk study.  Small, mild fixed inferolateral defect, likely artifact. No reversible ischemia. LVEF 60% with normal wall motion. This is a low risk study.  Echo 02/06/12: LVEF 55-60%.  Mild mitral regurgitation.    7 Day Event Monitor 05/2018:  Quality: Fair.  Baseline artifact. Predominant rhythm: sinus rhythm with first degree AV block Average heart rate: 69 bpm Max heart rate: 118 bpm Min heart rate: 54 bpm Pauses >2.5 seconds: none  No arrhythmias  07/05/19: 1. Left ventricular ejection fraction, by estimation, is 55 to 60%. The  left ventricle has normal function. The left ventricle has no regional  wall motion abnormalities. Left ventricular diastolic parameters are  consistent with Grade I diastolic  dysfunction (impaired relaxation).  2. Right ventricular systolic function is normal. The right ventricular  size is normal. Tricuspid regurgitation signal is inadequate for assessing  PA pressure.  3. The mitral valve is degenerative. Trivial mitral valve regurgitation.  No evidence of mitral stenosis.  4. Focal calcification of the [place] is present. The aortic valve is  tricuspid. Aortic valve regurgitation is not visualized. Mild aortic valve  sclerosis is present, with no evidence of aortic valve stenosis.  5. There is moderate dilatation of the ascending aorta measuring 43 mm.  6. The inferior vena cava is normal in size with greater than 50%  respiratory variability, suggesting right atrial pressure of 3 mmHg.   Comparison(s): No significant change from prior study. LV function  unchanged. Ascending aorta remains ~43 mm.    Past Medical History:  Diagnosis Date  . Ascending aortic aneurysm (HCC)    4.2 cm by CT 08/2015.  Repeat in 1 year.  . Asthma   . Coronary artery disease    s/p Rotablator to mid RCA in 2/12  . Dyslipidemia   . Fatigue   . GERD (gastroesophageal reflux disease)   . Heart attack ([place])     1986  . Hypertension   . PONV (postoperative nausea and vomiting)   . Rectal polyp    hyplastic  . Varicose vein   . Weakness    Past Surgical History:  Procedure Laterality Date  . ABDOMINAL ANGIOGRAM  02/12/2012   Procedure: ABDOMINAL ANGIOGRAM;  Surgeon: Peter M Martinique, MD;  Location: [facility] CATH LAB;  Service: Cardiovascular;;  . ABDOMINAL HYSTERECTOMY  1996   total w/BSO  . CATARACT EXTRACTION     june 2016, july 2016  . CORONARY ANGIOPLASTY  2/12   Rotablator to the RCA  . CORONARY STENT PLACEMENT    . ETHMOIDECTOMY Left 09/03/2017   Procedure: ETHMOIDECTOMY;  Surgeon: Clista Bernhardt, MD;  Location: [place];  Service: Ophthalmology;  Laterality: Left;  . HARDWARE REMOVAL  02/21/2011   Procedure: HARDWARE REMOVAL;  Surgeon: Laurice Record Aplington;  Location: [place];  Service: Orthopedics;  Laterality: Left;  REMOVAL TWO SCREWS DISTAL LEFT TIBIA   . LACRIMAL DUCT EXPLORATION Left 09/03/2017   Procedure: LACRIMAL DUCT EXPLORATION;  Surgeon: Clista Bernhardt, MD;  Location:  [place] OR;  Service: Ophthalmology;  Laterality: Left;  . LEFT HEART CATHETERIZATION WITH CORONARY ANGIOGRAM N/A 02/12/2012   Procedure: LEFT HEART CATHETERIZATION WITH CORONARY ANGIOGRAM;  Surgeon: Peter M Martinique, MD;  Location: [facility] CATH LAB;  Service: Cardiovascular;  Laterality: N/A;  . LEG SURGERY Left    rod, pins, screws   . ORIF TIBIA & FIBULA FRACTURES  2000  . polyp removal    . SPINE SURGERY     2015     Current Meds  Medication Sig  . aspirin EC 81 MG tablet Take 81 mg by mouth daily.  . furosemide (LASIX) 20 MG tablet 1 TABLET BY MOUTH ONCE A WEEK (Patient taking differently: Take 20 mg by mouth once a week. 1 TABLET BY MOUTH ONCE A WEEK ON SATURDAYS)  . loratadine (CLARITIN) 10 MG tablet Take 10 mg by mouth daily.  . nitroGLYCERIN (NITROSTAT) 0.4 MG SL tablet PLACE 1 TABLET (0.4 MG TOTAL) UNDER THE TONGUE EVERY 5 (FIVE) MINUTES AS NEEDED. FOR CHEST PAIN     Allergies:    Morphine, Phenergan [promethazine hcl], and Imdur [isosorbide dinitrate]   Social History   Tobacco Use  . Smoking status: Never Smoker  . Smokeless tobacco: Never Used  Vaping Use  . Vaping Use: Never used  Substance Use Topics  . Alcohol use: No    Alcohol/week: 0.0 standard drinks  . Drug use: No     Family Hx: The patient's family history includes Cancer in her daughter and sister; Heart attack in her brother, brother, brother, father, mother, and sister; Heart disease in her brother, daughter, father, mother, and son; Hyperlipidemia in her daughter; Varicose Veins in her daughter. There is no history of Colon cancer.  ROS:   Please see the history of present illness.     All other systems reviewed and are negative.   Labs/Other Tests and Data Reviewed:    Recent Labs: 03/18/2019: TSH 3.600   Recent Lipid Panel Lab Results  Component Value Date/Time   CHOL 312 (H) 09/21/2018 12:31 PM   TRIG 189 (H) 09/21/2018 12:31 PM   HDL 55 09/21/2018 12:31 PM   CHOLHDL 5.7 (H) 09/21/2018 12:31 PM   CHOLHDL 6.2 (H) 05/30/2016 09:00 AM   LDLCALC 219 (H) 09/21/2018 12:31 PM   LDLDIRECT 215.3 04/05/2013 12:42 PM    Wt Readings from Last 3 Encounters:  10/12/19 168 lb (76.2 kg)  07/05/19 177 lb 6.4 oz (80.5 kg)  04/27/19 171 lb (77.6 kg)     Exam:    BP 138/87   Pulse 92   Ht 5\' 6"  (1.676 m)   Wt 168 lb (76.2 kg)   BMI 27.12 kg/m  GENERAL: Well-appearing.  No acute distress. HEENT: Pupils equal round.  Oral mucosa unremarkable NECK:  No jugular venous distention, no visible thyromegaly EXT:  No edema, no cyanosis no clubbing SKIN:  No rashes no nodules NEURO:  Speech fluent.  Cranial nerves grossly intact.  Moves all 4 extremities freely PSYCH:  Cognitively intact, oriented to person place and time    ASSESSMENT & PLAN:    # Hypertension: Blood pressure is much better controlled.  Continue amlodipine, carvedilol, hydralazine, and lisinopril.  # CAD s/p MI and  RCA PCI: # Chest pain: Chest pain resolved. Continue aspirin,carvedilol, amlodipine,and Repatha.    # Shortness of breath:  # Tachycardia: No abnormalities noted on 7-day event monitor.Thisis likelydue to anxiety and stress from caring for her husband with dementia. There is no evidence of heart  failure on exam.  Continue carvedilol.  # Hyperlipidemia:  LDL 60 on 10/2018.  Continue atorvastatin.   Unable to afford Repatha.  # Ascending aorta aneurysm:Stable.  4.2 cm on CT 08/2015. 4.3 cm on echo 08/28/17 and 05/2019. Continue BP control as above.     Medication Adjustments/Labs and Tests Ordered: Current medicines are reviewed at length with the patient today.  Concerns regarding medicines are outlined above.   Tests Ordered: No orders of the defined types were placed in this encounter.  Medication Changes: No orders of the defined types were placed in this encounter.  COVID-19 Education: The signs and symptoms of COVID-19 were discussed with the patient and how to seek care for testing (follow up with PCP or arrange E-visit). The importance of social distancing was discussed today.  Time:   Today, I have spent 25 minutes with the patient with telehealth technology discussing the above problems.    Disposition: Follow up with Tesha Archambeau C. Oval Linsey, MD, [facility] in 6 months  Signed, Skeet Latch, MD  10/12/2019 10:31 AM    [place]

## 2019-10-14 ENCOUNTER — Telehealth: Payer: Self-pay | Admitting: *Deleted

## 2019-10-14 ENCOUNTER — Other Ambulatory Visit: Payer: Self-pay | Admitting: *Deleted

## 2019-10-14 ENCOUNTER — Telehealth: Payer: Self-pay | Admitting: Radiology

## 2019-10-14 DIAGNOSIS — R42 Dizziness and giddiness: Secondary | ICD-10-CM

## 2019-10-14 DIAGNOSIS — R002 Palpitations: Secondary | ICD-10-CM

## 2019-10-14 NOTE — Telephone Encounter (Signed)
Enrolled patient for a 14 day [employer] Event monitor to be mailed to patients home.  

## 2019-10-14 NOTE — Telephone Encounter (Signed)
Patient came to office with her husband who had appointment with Dr Oval Linsey Patient complaining of being lightheaded and having palpitations Patient having episodes every 3 to 4 days  Dr Oval Linsey ordered 14 day event monitor

## 2019-11-07 ENCOUNTER — Other Ambulatory Visit: Payer: Self-pay | Admitting: Cardiovascular Disease

## 2019-11-07 DIAGNOSIS — Z1231 Encounter for screening mammogram for malignant neoplasm of breast: Secondary | ICD-10-CM

## 2019-11-18 ENCOUNTER — Ambulatory Visit
Admission: RE | Admit: 2019-11-18 | Discharge: 2019-11-18 | Disposition: A | Discharge status: Home / Self Care | Payer: Medicare Other | Source: Ambulatory Visit | Attending: Cardiovascular Disease | Admitting: Cardiovascular Disease

## 2019-11-18 ENCOUNTER — Other Ambulatory Visit: Payer: Self-pay

## 2019-11-18 DIAGNOSIS — Z1231 Encounter for screening mammogram for malignant neoplasm of breast: Secondary | ICD-10-CM

## 2019-11-23 ENCOUNTER — Telehealth: Payer: Self-pay | Admitting: Cardiovascular Disease

## 2019-11-23 NOTE — Telephone Encounter (Signed)
Follow up:     Patient returning call back from yesterday. Please call patient back. 

## 2019-11-23 NOTE — Telephone Encounter (Signed)
Left message to call back  

## 2019-11-23 NOTE — Telephone Encounter (Signed)
-----   Message from Skeet Latch, MD sent at 11/21/2019  3:23 PM EDT ----- I am not sure why this is coming to me but her mammogram is normal

## 2019-11-24 NOTE — Telephone Encounter (Signed)
Informed patient of result and verbal understanding expressed.

## 2019-11-24 NOTE — Telephone Encounter (Signed)
Patient is returning call.  °

## 2019-12-15 ENCOUNTER — Other Ambulatory Visit: Payer: Self-pay | Admitting: Internal Medicine

## 2019-12-15 DIAGNOSIS — Z13820 Encounter for screening for osteoporosis: Secondary | ICD-10-CM

## 2019-12-30 ENCOUNTER — Other Ambulatory Visit: Payer: Self-pay

## 2019-12-30 ENCOUNTER — Ambulatory Visit
Admission: RE | Admit: 2019-12-30 | Discharge: 2019-12-30 | Disposition: A | Discharge status: Home / Self Care | Payer: Medicare Other | Source: Ambulatory Visit | Attending: Internal Medicine | Admitting: Internal Medicine

## 2019-12-30 DIAGNOSIS — Z13820 Encounter for screening for osteoporosis: Secondary | ICD-10-CM

## 2020-01-12 NOTE — Telephone Encounter (Signed)
Received documentation from [employer] that pt has not complied with requests to initiate home hook up study.   Order Cancelled.Marland Kitchen

## 2020-01-18 ENCOUNTER — Other Ambulatory Visit: Payer: Self-pay | Admitting: Cardiovascular Disease

## 2020-01-30 ENCOUNTER — Other Ambulatory Visit: Payer: Self-pay | Admitting: Cardiovascular Disease

## 2020-02-14 ENCOUNTER — Other Ambulatory Visit: Payer: Self-pay | Admitting: Internal Medicine

## 2020-02-14 DIAGNOSIS — R221 Localized swelling, mass and lump, neck: Secondary | ICD-10-CM

## 2020-03-08 ENCOUNTER — Other Ambulatory Visit: Payer: Self-pay | Admitting: Cardiovascular Disease

## 2020-03-09 ENCOUNTER — Other Ambulatory Visit: Payer: Medicare Other

## 2020-04-11 ENCOUNTER — Ambulatory Visit (INDEPENDENT_AMBULATORY_CARE_PROVIDER_SITE_OTHER): Payer: Medicare Other | Admitting: Cardiovascular Disease

## 2020-04-11 ENCOUNTER — Other Ambulatory Visit: Payer: Self-pay

## 2020-04-11 ENCOUNTER — Encounter: Payer: Self-pay | Admitting: Cardiovascular Disease

## 2020-04-11 VITALS — BP 140/88 | HR 70 | Ht 66.0 in | Wt 163.4 lb

## 2020-04-11 DIAGNOSIS — E785 Hyperlipidemia, unspecified: Secondary | ICD-10-CM

## 2020-04-11 DIAGNOSIS — I712 Thoracic aortic aneurysm, without rupture: Secondary | ICD-10-CM | POA: Diagnosis not present

## 2020-04-11 DIAGNOSIS — I2510 Atherosclerotic heart disease of native coronary artery without angina pectoris: Secondary | ICD-10-CM | POA: Diagnosis not present

## 2020-04-11 DIAGNOSIS — I10 Essential (primary) hypertension: Secondary | ICD-10-CM

## 2020-04-11 DIAGNOSIS — I7121 Aneurysm of the ascending aorta, without rupture: Secondary | ICD-10-CM

## 2020-04-11 MED ORDER — AMLODIPINE BESYLATE 10 MG PO TABS: 10.0000 mg | ORAL_TABLET | Freq: Every day | ORAL | 3 refills | Status: DC

## 2020-04-11 NOTE — Progress Notes (Signed)
Cardiology Office Note   Date:  04/11/2020   ID:  Lindsey Middleton, DOB 10/23/1940, MRN [id number]  PCP:  Lindsey Bush, NP  Cardiologist:  Skeet Latch, MD  Electrophysiologist:  None   Evaluation Performed:  Follow-Up Visit  Chief Complaint:  hypertension  History of Present Illness:    Lindsey Middleton is a 80 y.o. female who CAD, hypertension, hyperlipidemia, mild ascending aortic aneurysm, and syncope who presents for follow up. Lindsey Middleton was previously a patient of [name]. She had an MI in 1986 and in 2012 that was treated with DES to the mid RCA. She had a cath in 2013 at which time the stent was patent and she had non-obstructive CAD. She had a [place] Myoview4/2017 as a pre-op clearance and it was low risk for ischemia. She last saw Lindsey Middleton 07/2015. At that appointment her blood pressure was not well-controlled so lisinopril was increased to 20 mg daily. Her blood pressure was still poorly controlled on 09/14/15. Imdur was added to her regimen due to chest discomfort and poorly controlled hypertension. She was also noted to be tachycardic and reported increased shortness of breath as well as lower extremity edema. Therefore, she was referred for lower extremity Dopplers that were negative for DVT. She had a chest CT-A on 09/17/15 that was negative for pulmonary embolism. It showed moderate coronary artery calcifications and a mild ascending aortic aneurysm. Her lipids have been poorly-controlled. Her LDL has been over 170 on atorvastatin 80 mg daily so Zetia was added to her regimen. Her lipids remained elevated so she was started on Repatha.  Lindsey Middleton had anecho7/2019to followup on her ascending aorta aneurysm. It was stable at 4.3 cm.  It was unchanged 06/2019. LVEF was 95-28%UXLK grade 1 diastolic dysfunction.She wore a 7-day heart monitor that showed no arrhythmias.Hydralazine was added.  Her blood pressure became low.  She reports calling our  office and was told to stop amlodipine.  It was subsequently started start.  At her last appointment hydralazine was increased to 100mg  tid.  ThFor the last few weeks her BP has been in the 150-160s.  She thinks that this is because of stress.  Her daughter got a heart transplant a few weeks ago and is living with them.  She has been feeling very stressed and the daughter isn't very nice to them.  She hasn't been gettting much exercise.  They try to walk at the mall but his feet get hot so they have to stop.  She had pneumonia 1-2 months ago.    Prior CV studies:   The following studies were reviewed today:  [place] Myoview 05/28/15:  Nuclear stress EF: 60%.  No T wave inversion was noted during stress.  There was no ST segment deviation noted during stress.  Defect 1: There is a small defect of mild severity.  This is a low risk study.  Small, mild fixed inferolateral defect, likely artifact. No reversible ischemia. LVEF 60% with normal wall motion. This is a low risk study.  Echo 02/06/12: LVEF 55-60%.  Mild mitral regurgitation.    7 Day Event Monitor 05/2018:  Quality: Fair.  Baseline artifact. Predominant rhythm: sinus rhythm with first degree AV block Average heart rate: 69 bpm Max heart rate: 118 bpm Min heart rate: 54 bpm Pauses >2.5 seconds: none  No arrhythmias  07/05/19: 1. Left ventricular ejection fraction, by estimation, is 55 to 60%. The  left ventricle has normal function. The left ventricle has no  regional  wall motion abnormalities. Left ventricular diastolic parameters are  consistent with Grade I diastolic  dysfunction (impaired relaxation).  2. Right ventricular systolic function is normal. The right ventricular  size is normal. Tricuspid regurgitation signal is inadequate for assessing  PA pressure.  3. The mitral valve is degenerative. Trivial mitral valve regurgitation.  No evidence of mitral stenosis.  4. Focal calcification of the [place] is  present. The aortic valve is  tricuspid. Aortic valve regurgitation is not visualized. Mild aortic valve  sclerosis is present, with no evidence of aortic valve stenosis.  5. There is moderate dilatation of the ascending aorta measuring 43 mm.  6. The inferior vena cava is normal in size with greater than 50%  respiratory variability, suggesting right atrial pressure of 3 mmHg.   Comparison(s): No significant change from prior study. LV function  unchanged. Ascending aorta remains ~43 mm.   EKG 04/11/2020: Sinus rhythm.  Rate 70 bpm.  LAFB.  Cannot rule out prior inferior infarct.  Poor R wave progression.   Past Medical History:  Diagnosis Date  . Ascending aortic aneurysm (HCC)    4.2 cm by CT 08/2015.  Repeat in 1 year.  . Asthma   . Coronary artery disease    s/p Rotablator to mid RCA in 2/12  . Dyslipidemia   . Fatigue   . GERD (gastroesophageal reflux disease)   . Heart attack ([place])    1986  . Hypertension   . PONV (postoperative nausea and vomiting)   . Rectal polyp    hyplastic  . Varicose vein   . Weakness    Past Surgical History:  Procedure Laterality Date  . ABDOMINAL ANGIOGRAM  02/12/2012   Procedure: ABDOMINAL ANGIOGRAM;  Surgeon: Peter M Martinique, MD;  Location[facility] C[facility] LAB;  Service: Cardiovascular;;  . ABDOMINAL HYSTERECTOMY  1996   total w/BSO  . CATARACT EXTRACTION     june 2016, july 2016  . CORONARY ANGIOPLASTY  2/12   Rotablator to the RCA  . CORONARY STENT PLACEMENT    . ETHMOIDECTOMY Left 09/03/2017   Procedure: ETHMOIDECTOMY;  Surgeon: Clista Bernhardt, MD;  Location: [place];  Service: Ophthalmology;  Laterality: Left;  . HARDWARE REMOVAL  02/21/2011   Procedure: HARDWARE REMOVAL;  Surgeon: Laurice Record [place]ton;  Location: [place];  Service: Orthopedics;  Laterality: Left;  REMOVAL TWO SCREWS DISTAL LEFT TIBIA   . LACRIMAL DUCT EXPLORATION Left 09/03/2017   Procedure: LACRIMAL DUCT EXPLORATION;  Surgeon: Clista Bernhardt, MD;   Location: [place];  Service: Ophthalmology;  Laterality: Left;  . LEFT HEART CATHETERIZATION WITH CORONARY ANGIOGRAM N/A 02/12/2012   Procedure: LEFT HEART CATHETERIZATION WITH CORONARY ANGIOGRAM;  Surgeon: Peter M Martinique, MD;  Location: [facility] CATH LAB;  Service: Cardiovascular;  Laterality: N/A;  . LEG SURGERY Left    rod, pins, screws   . ORIF TIBIA & FIBULA FRACTURES  2000  . polyp removal    . SPINE SURGERY     2015     Current Meds  Medication Sig  . ALPRAZolam (XANAX) 0.25 MG tablet Take 0.25 mg by mouth 2 (two) times daily as needed for anxiety.  Marland Kitchen aspirin EC 81 MG tablet Take 81 mg by mouth daily.  Marland Kitchen atorvastatin (LIPITOR) 80 MG tablet TAKE 1 TABLET BY MOUTH EVERY DAY  . furosemide (LASIX) 20 MG tablet 1 TABLET BY MOUTH ONCE A WEEK (Patient taking differently: Take 20 mg by mouth once a week. 1 TABLET BY MOUTH ONCE  A WEEK ON SATURDAYS)  . loratadine (CLARITIN) 10 MG tablet Take 10 mg by mouth daily.  . nitroGLYCERIN (NITROSTAT) 0.4 MG SL tablet PLACE 1 TABLET (0.4 MG TOTAL) UNDER THE TONGUE EVERY 5 (FIVE) MINUTES AS NEEDED. FOR CHEST PAIN     Allergies:   Morphine, Phenergan [promethazine hcl], and Imdur [isosorbide dinitrate]   Social History   Tobacco Use  . Smoking status: Never Smoker  . Smokeless tobacco: Never Used  Vaping Use  . Vaping Use: Never used  Substance Use Topics  . Alcohol use: No    Alcohol/week: 0.0 standard drinks  . Drug use: No     Family Hx: The patient's family history includes Cancer in her daughter and sister; Heart attack in her brother, brother, brother, father, mother, and sister; Heart disease in her brother, daughter, father, mother, and son; Hyperlipidemia in her daughter; Varicose Veins in her daughter. There is no history of Colon cancer.  ROS:   Please see the history of present illness.     All other systems reviewed and are negative.   Labs/Other Tests and Data Reviewed:    Recent Labs: No results found for requested labs within  last 8760 hours.   Recent Lipid Panel Lab Results  Component Value Date/Time   CHOL 312 (H) 09/21/2018 12:31 PM   TRIG 189 (H) 09/21/2018 12:31 PM   HDL 55 09/21/2018 12:31 PM   CHOLHDL 5.7 (H) 09/21/2018 12:31 PM   CHOLHDL 6.2 (H) 05/30/2016 09:00 AM   LDLCALC 219 (H) 09/21/2018 12:31 PM   LDLDIRECT 215.3 04/05/2013 12:42 PM    Wt Readings from Last 3 Encounters:  04/11/20 163 lb 6.4 oz (74.1 kg)  10/12/19 168 lb (76.2 kg)  07/05/19 177 lb 6.4 oz (80.5 kg)     Exam:    VS:  BP 140/88   Pulse 70   Ht 5\' 6"  (1.676 m)   Wt 163 lb 6.4 oz (74.1 kg)   SpO2 97%   BMI 26.37 kg/m  , BMI Body mass index is 26.37 kg/m. GENERAL:  Well appearing HEENT: Pupils equal round and reactive, fundi not visualized, oral mucosa unremarkable NECK:  No jugular venous distention, waveform within normal limits, carotid upstroke brisk and symmetric, no bruits LUNGS:  Clear to auscultation bilaterally HEART:  RRR.  PMI not displaced or sustained,S1 and S2 within normal limits, no S3, no S4, no clicks, no rubs, no murmurs ABD:  Flat, positive bowel sounds normal in frequency in pitch, no bruits, no rebound, no guarding, no midline pulsatile mass, no hepatomegaly, no splenomegaly EXT:  2 plus pulses throughout, no edema, no cyanosis no clubbing SKIN:  No rashes no nodules NEURO:  Cranial nerves II through XII grossly intact, motor grossly intact throughout PSYCH:  Cognitively intact, oriented to person place and time   ASSESSMENT & PLAN:    # Hypertension:  Blood pressure is high lately.  She thinks that this is due to stress.  Increase amlodipine to 10mg  and continue hydralazine 100 mg 3 times daily.  # CAD s/p MI and RCA PCI: # Chest pain: Chest pain resolved. Continue aspirin,carvedilol, amlodipine,and Repatha.    # Shortness of breath:  # Tachycardia: No abnormalities noted on 7-day event monitor.Thisis likelydue to anxiety and stress from caring for her husband with dementia.  There is no evidence of heart failure on exam.  Continue carvedilol.  # Hyperlipidemia:  LDL 60 on 10/2018.  Continue atorvastatin.   Unable to afford Repatha.  She is  going to have lipids checked with her PCP at follow-up next month.  Her goal is less than 70.  # Ascending aorta aneurysm:Stable.  4.2 cm on CT 08/2015. 4.3 cm on echo 08/28/17 and 05/2019. Continue BP control as above.   Time spent: 40 minutes-Greater than 50% of this time was spent in counseling, explanation of diagnosis, planning of further management, and coordination of care.   Medication Adjustments/Labs and Tests Ordered: Current medicines are reviewed at length with the patient today.  Concerns regarding medicines are outlined above.   Tests Ordered: Orders Placed This Encounter  Procedures  . EKG 12-Lead   Medication Changes: Meds ordered this encounter  Medications  . amLODipine (NORVASC) 10 MG tablet    Sig: Take 1 tablet (10 mg total) by mouth daily.    Dispense:  90 tablet    Refill:  3    NEW DOSE, D/C 5 MG RX    Disposition: Follow up with Jalen Daluz C. Oval Linsey, MD, [facility] in 3 months  Signed, Skeet Latch, MD  04/11/2020 5:46 PM    [place]

## 2020-04-11 NOTE — Patient Instructions (Addendum)
Medication Instructions:  INCREASES YOUR AMLODIPINE TO 10 MG DAILY   *If you need a refill on your cardiac medications before your next appointment, please call your pharmacy*  Lab Work: NONE  Testing/Procedures: NONE   Follow-Up: At [facility], you and your health needs are our priority.  As part of our continuing mission to provide you with exceptional heart care, we have created designated Provider Care Teams.  These Care Teams include your primary Cardiologist (physician) and Advanced Practice Providers (APPs -  Physician Assistants and Nurse Practitioners) who all work together to provide you with the care you need, when you need it.  We recommend signing up for the patient portal called "MyChart".  Sign up information is provided on this After Visit Summary.  MyChart is used to connect with patients for Virtual Visits (Telemedicine).  Patients are able to view lab/test results, encounter notes, upcoming appointments, etc.  Non-urgent messages can be sent to your provider as well.   To learn more about what you can do with MyChart, go to [email or web address].    Your next appointment:   3 month(s)  The format for your next appointment:   In [place]   Provider:   You may see Skeet Latch, MD or one of the following Advanced Practice Providers on your designated Care Team:    Kerin Ransom, PA-C  [place], [place]  Coletta Memos, [place]

## 2020-06-04 ENCOUNTER — Other Ambulatory Visit: Payer: Self-pay | Admitting: Internal Medicine

## 2020-06-04 DIAGNOSIS — I872 Venous insufficiency (chronic) (peripheral): Secondary | ICD-10-CM

## 2020-06-06 ENCOUNTER — Other Ambulatory Visit (HOSPITAL_COMMUNITY): Payer: Self-pay | Admitting: Internal Medicine

## 2020-06-06 DIAGNOSIS — R600 Localized edema: Secondary | ICD-10-CM

## 2020-07-04 ENCOUNTER — Other Ambulatory Visit: Payer: Medicare Other

## 2020-07-06 ENCOUNTER — Other Ambulatory Visit (HOSPITAL_COMMUNITY): Payer: Medicare Other

## 2020-07-09 ENCOUNTER — Other Ambulatory Visit: Payer: Self-pay | Admitting: Cardiovascular Disease

## 2020-07-27 ENCOUNTER — Ambulatory Visit (INDEPENDENT_AMBULATORY_CARE_PROVIDER_SITE_OTHER): Payer: Medicare Other | Admitting: Cardiovascular Disease

## 2020-07-27 ENCOUNTER — Other Ambulatory Visit: Payer: Self-pay

## 2020-07-27 ENCOUNTER — Encounter: Payer: Self-pay | Admitting: Cardiovascular Disease

## 2020-07-27 ENCOUNTER — Ambulatory Visit (HOSPITAL_COMMUNITY)
Admission: RE | Admit: 2020-07-27 | Discharge: 2020-07-27 | Disposition: A | Discharge status: Home / Self Care | Payer: Medicare Other | Source: Ambulatory Visit | Attending: Cardiovascular Disease | Admitting: Cardiovascular Disease

## 2020-07-27 VITALS — BP 126/80 | HR 112 | Resp 20 | Ht 66.0 in | Wt 163.0 lb

## 2020-07-27 DIAGNOSIS — E785 Hyperlipidemia, unspecified: Secondary | ICD-10-CM

## 2020-07-27 DIAGNOSIS — R600 Localized edema: Secondary | ICD-10-CM | POA: Insufficient documentation

## 2020-07-27 DIAGNOSIS — I712 Thoracic aortic aneurysm, without rupture: Secondary | ICD-10-CM

## 2020-07-27 DIAGNOSIS — I2510 Atherosclerotic heart disease of native coronary artery without angina pectoris: Secondary | ICD-10-CM

## 2020-07-27 DIAGNOSIS — I7121 Aneurysm of the ascending aorta, without rupture: Secondary | ICD-10-CM

## 2020-07-27 DIAGNOSIS — I10 Essential (primary) hypertension: Secondary | ICD-10-CM

## 2020-07-27 DIAGNOSIS — Z5181 Encounter for therapeutic drug level monitoring: Secondary | ICD-10-CM | POA: Diagnosis not present

## 2020-07-27 HISTORY — DX: Localized edema: R60.0

## 2020-07-27 MED ORDER — FUROSEMIDE 40 MG PO TABS: 40.0000 mg | ORAL_TABLET | Freq: Every day | ORAL | 1 refills | Status: DC

## 2020-07-27 NOTE — Progress Notes (Signed)
Cardiology Office Note   Date:  07/27/2020   ID:  Lindsey Middleton, DOB [birth year], MRN [id number]  PCP:  Ria Bush, NP  Cardiologist:  Skeet Latch, MD  Electrophysiologist:  None   Evaluation Performed:  Follow-Up Visit  Chief Complaint:  hypertension  History of Present Illness:    Lindsey Middleton is a 80 y.o. female who CAD, hypertension, hyperlipidemia, mild ascending aortic aneurysm, and syncope who presents for follow up. Lindsey Middleton was previously a patient of [name]. She had an MI in 1986 and in 2012 that was treated with DES to the mid RCA. She had a cath in 2013 at which time the stent was patent and she had non-obstructive CAD. She had a [place] Myoview4/2017 as a pre-op clearance and it was low risk for ischemia. She last saw [name] 07/2015. At that appointment her blood pressure was not well-controlled so lisinopril was increased to 20 mg daily. Her blood pressure was still poorly controlled on 09/14/15. Imdur was added to her regimen due to chest discomfort and poorly controlled hypertension. She was also noted to be tachycardic and reported increased shortness of breath as well as lower extremity edema. Therefore, she was referred for lower extremity Dopplers that were negative for DVT. She had a chest CT-A on 09/17/15 that was negative for pulmonary embolism. It showed moderate coronary artery calcifications and a mild ascending aortic aneurysm. Her lipids have been poorly-controlled. Her LDL has been over 170 on atorvastatin 80 mg daily so Zetia was added to her regimen. Her lipids remained elevated so she was started on Repatha.  Lindsey Middleton had anecho7/2019to followup on her ascending aorta aneurysm. It was stable at 4.3 cm.  It was unchanged 06/2019. LVEF was 86-57%QION grade 1 diastolic dysfunction.She wore a 7-day heart monitor that showed no arrhythmias.Hydralazine was added.  Her blood pressure became low.  She reports calling our office  and was told to stop amlodipine.  It was subsequently restarted. Hydralazine was increased to 100mg  tid. Her blood pressure remained high so amlodipine was increased to 10 mg.     Today, she is accompanied by her husband. Her main concern today is having bilateral LE edema (R>L) since last week. This is a new symptom for her. They have not been on any trips or long drives. She does not monitor her blood pressure at home. She reports having a COVID infection about a month ago, and has recovered well. Previously, she fractured her left tibia and fibula, and continues to have minor difficulty when walking. She denies any chest pain, shortness of breath, palpitations, or exertional symptoms. No headaches, lightheadedness, or syncope to report. Also has no orthopnea or PND.   Prior CV studies:   The following studies were reviewed today:  US Renal Artery Duplex 07/22/2019: Largest Aortic Diameter: 2.4 cm    Renal:  Right: Normal size right kidney. Normal right Resisitive Index.     Normal cortical thickness of right kidney. RRV flow present.     1-59% stenosis of the right renal artery.  Left: Normal size of left kidney. Normal left Resistive Index.     Normal cortical thickness of the left kidney. LRV flow     present. 1-59% stenosis of the left renal artery.  Mesenteric:  Normal Celiac artery and Superior Mesenteric artery        findings.   Echo 07/05/2019: 1. Left ventricular ejection fraction, by estimation, is 55 to 60%. The  left ventricle has  normal function. The left ventricle has no regional  wall motion abnormalities. Left ventricular diastolic parameters are  consistent with Grade I diastolic  dysfunction (impaired relaxation).  2. Right ventricular systolic function is normal. The right ventricular  size is normal. Tricuspid regurgitation signal is inadequate for assessing  PA pressure.  3. The mitral valve is degenerative. Trivial mitral valve regurgitation.   No evidence of mitral stenosis.  4. Focal calcification of the [place] is present. The aortic valve is  tricuspid. Aortic valve regurgitation is not visualized. Mild aortic valve  sclerosis is present, with no evidence of aortic valve stenosis.  5. There is moderate dilatation of the ascending aorta measuring 43 mm.  6. The inferior vena cava is normal in size with greater than 50%  respiratory variability, suggesting right atrial pressure of 3 mmHg.   Comparison(s): No significant change from prior study. LV function  unchanged. Ascending aorta remains ~43 mm.  Cardiac Telemetry Monitoring 06/16/2018:  7 Day Event Monitor  Quality: Fair.  Baseline artifact. Predominant rhythm: sinus rhythm with first degree AV block Average heart rate: 69 bpm Max heart rate: 118 bpm Min heart rate: 54 bpm Pauses >2.5 seconds: none  No arrhythmias  [place] Myoview 05/28/15:  Nuclear stress EF: 60%.  No T wave inversion was noted during stress.  There was no ST segment deviation noted during stress.  Defect 1: There is a small defect of mild severity.  This is a low risk study.  Small, mild fixed inferolateral defect, likely artifact. No reversible ischemia. LVEF 60% with normal wall motion. This is a low risk study.  Echo 02/06/12: LVEF 55-60%.  Mild mitral regurgitation.    EKG: 07/27/2020: Sinus rhythm. Rate 83 bpm. LAFB 04/11/2020: Sinus rhythm.  Rate 70 bpm.  LAFB.  Cannot rule out prior inferior infarct.  Poor R wave progression.   Past Medical History:  Diagnosis Date  . Ascending aortic aneurysm (HCC)    4.2 cm by CT 08/2015.  Repeat in 1 year.  . Asthma   . Coronary artery disease    s/p Rotablator to mid RCA in 2/12  . Dyslipidemia   . Fatigue   . GERD (gastroesophageal reflux disease)   . Heart attack ([place])    1986  . Hypertension   . Leg edema, right 07/27/2020  . PONV (postoperative nausea and vomiting)   . Rectal polyp    hyplastic  . Varicose vein   .  Weakness    Past Surgical History:  Procedure Laterality Date  . ABDOMINAL ANGIOGRAM  02/12/2012   Procedure: ABDOMINAL ANGIOGRAM;  Surgeon: Peter M Martinique, MD;  Location: [facility] CATH LAB;  Service: Cardiovascular;;  . ABDOMINAL HYSTERECTOMY  1996   total w/BSO  . CATARACT EXTRACTION     june 2016, july 2016  . CORONARY ANGIOPLASTY  2/12   Rotablator to the RCA  . CORONARY STENT PLACEMENT    . ETHMOIDECTOMY Left 09/03/2017   Procedure: ETHMOIDECTOMY;  Surgeon: Clista Bernhardt, MD;  Location: [place];  Service: Ophthalmology;  Laterality: Left;  . HARDWARE REMOVAL  02/21/2011   Procedure: HARDWARE REMOVAL;  Surgeon: Laurice Record [place];  Location: [place];  Service: Orthopedics;  Laterality: Left;  REMOVAL TWO SCREWS DISTAL LEFT TIBIA   . LACRIMAL DUCT EXPLORATION Left 09/03/2017   Procedure: LACRIMAL DUCT EXPLORATION;  Surgeon: Clista Bernhardt, MD;  Location: [place];  Service: Ophthalmology;  Laterality: Left;  . LEFT HEART CATHETERIZATION WITH CORONARY ANGIOGRAM N/A 02/12/2012  Procedure: LEFT HEART CATHETERIZATION WITH CORONARY ANGIOGRAM;  Surgeon: Peter M Martinique, MD;  Location: [facility] CATH LAB;  Service: Cardiovascular;  Laterality: N/A;  . LEG SURGERY Left    rod, pins, screws   . ORIF TIBIA & FIBULA FRACTURES  2000  . polyp removal    . SPINE SURGERY     2015     Current Meds  Medication Sig  . ALPRAZolam (XANAX) 0.25 MG tablet Take 0.25 mg by mouth 2 (two) times daily as needed for anxiety.  Marland Kitchen aspirin EC 81 MG tablet Take 81 mg by mouth daily.  Marland Kitchen atorvastatin (LIPITOR) 80 MG tablet TAKE 1 TABLET BY MOUTH EVERY DAY  . loratadine (CLARITIN) 10 MG tablet Take 10 mg by mouth daily.  . nitroGLYCERIN (NITROSTAT) 0.4 MG SL tablet PLACE 1 TABLET (0.4 MG TOTAL) UNDER THE TONGUE EVERY 5 (FIVE) MINUTES AS NEEDED. FOR CHEST PAIN  . [DISCONTINUED] furosemide (LASIX) 20 MG tablet 1 TABLET BY MOUTH ONCE A WEEK (Patient taking differently: Take 20 mg by mouth once as needed. 1  TABLET BY MOUTH DAILY AS NEEDED)     Allergies:   Morphine, Phenergan [promethazine hcl], and Imdur [isosorbide dinitrate]   Social History   Tobacco Use  . Smoking status: Never Smoker  . Smokeless tobacco: Never Used  Vaping Use  . Vaping Use: Never used  Substance Use Topics  . Alcohol use: No    Alcohol/week: 0.0 standard drinks  . Drug use: No     Family Hx: The patient's family history includes Cancer in her daughter and sister; Heart attack in her brother, brother, brother, father, mother, and sister; Heart disease in her brother, daughter, father, mother, and son; Hyperlipidemia in her daughter; Varicose Veins in her daughter. There is no history of Colon cancer.  ROS:   Please see the history of present illness.    (+)  Bilateral LE edema (R>L) All other systems reviewed and are negative.   Labs/Other Tests and Data Reviewed:    Recent Labs: No results found for requested labs within last 8760 hours.   Recent Lipid Panel Lab Results  Component Value Date/Time   CHOL 312 (H) 09/21/2018 12:31 PM   TRIG 189 (H) 09/21/2018 12:31 PM   HDL 55 09/21/2018 12:31 PM   CHOLHDL 5.7 (H) 09/21/2018 12:31 PM   CHOLHDL 6.2 (H) 05/30/2016 09:00 AM   LDLCALC 219 (H) 09/21/2018 12:31 PM   LDLDIRECT 215.3 04/05/2013 12:42 PM    Wt Readings from Last 3 Encounters:  07/27/20 163 lb (73.9 kg)  04/11/20 163 lb 6.4 oz (74.1 kg)  10/12/19 168 lb (76.2 kg)     Exam:    VS:  BP 126/80 (BP Location: Left Arm, Patient Position: Sitting, Cuff Size: Normal)   Pulse (!) 112   Resp 20   Ht 5\' 6"  (1.676 m)   Wt 163 lb (73.9 kg)   SpO2 98%   BMI 26.31 kg/m  , BMI Body mass index is 26.31 kg/m. GENERAL:  Well appearing HEENT: Pupils equal round and reactive, fundi not visualized, oral mucosa unremarkable NECK:  No jugular venous distention, waveform within normal limits, carotid upstroke brisk and symmetric, no bruits LUNGS:  Clear to auscultation bilaterally HEART:  RRR.  PMI  not displaced or sustained,S1 and S2 within normal limits, no S3, no S4, no clicks, no rubs, no murmurs ABD:  Flat, positive bowel sounds normal in frequency in pitch, no bruits, no rebound, no guarding, no midline pulsatile mass, no  hepatomegaly, no splenomegaly EXT:  2 plus pulses throughout, no edema, no cyanosis no clubbing SKIN:  No rashes no nodules NEURO:  Cranial nerves II through XII grossly intact, motor grossly intact throughout PSYCH:  Cognitively intact, oriented to person place and time   ASSESSMENT & PLAN:   Leg edema, right Lindsey Middleton has unilateral swelling of her right leg.  This has been an issue chronically however it is worse today.  She is also tachycardic.  She denies any recent travel or dyspnea.  Nevertheless, she had recent COVID-19 infection which raises the risk of thromboembolic disease.  We got a stat LE Doppler in the office that was preliminarily read as negative for DVT.  She does have venous reflux bilaterally.  Recommended that she wear her compression socks.  We will increase her furosemide to 40 mg and she will get a basic metabolic panel in a week.  We will also repeat her echocardiogram.  Dyslipidemia Continue atorvastatin.  Essential hypertension Blood pressure is well have been controlled on amlodipine and hydralazine.  Coronary artery disease Prior coronary stent.  She has no angina.  Continue aspirin and atorvastatin.  Aneurysm of ascending aorta (HCC) Stable.  4.2 cm on CT 08/2015. 4.3 cm on echo 08/28/17 and 05/2019. Repeat echo.      Medication Adjustments/Labs and Tests Ordered: Current medicines are reviewed at length with the patient today.  Concerns regarding medicines are outlined above.   Tests Ordered: Orders Placed This Encounter  Procedures  . Basic metabolic panel  . EKG 12-Lead  . ECHOCARDIOGRAM COMPLETE  . VAS [place] LOWER EXTREMITY VENOUS (DVT)   Medication Changes: Meds ordered this encounter  Medications  . furosemide  (LASIX) 40 MG tablet    Sig: Take 1 tablet (40 mg total) by mouth daily.    Dispense:  90 tablet    Refill:  1    NEW DOSE, D/C PREVIOUS RX    Disposition: Follow up with Danesha Kirchoff C. Oval Linsey, MD, [facility] in 3 months  I,Mathew Stumpf,acting as a Education administrator for Skeet Latch, MD.,have documented all relevant documentation on the behalf of Skeet Latch, MD,as directed by  Skeet Latch, MD while in the presence of Skeet Latch, MD.  I, Milaca Oval Linsey, MD have reviewed all documentation for this visit.  The documentation of the exam, diagnosis, procedures, and orders on 07/27/2020 are all accurate and complete.   Signed, Skeet Latch, MD  07/27/2020 4:49 PM    [facility]

## 2020-07-27 NOTE — Assessment & Plan Note (Signed)
Prior coronary stent.  She has no angina.  Continue aspirin and atorvastatin.

## 2020-07-27 NOTE — Assessment & Plan Note (Addendum)
Ms. Haydon has unilateral swelling of her right leg.  This has been an issue chronically however it is worse today.  She is also tachycardic.  She denies any recent travel or dyspnea.  Nevertheless, she had recent COVID-19 infection which raises the risk of thromboembolic disease.  We got a stat LE Doppler in the office that was preliminarily read as negative for DVT.  She does have venous reflux bilaterally.  Recommended that she wear her compression socks.  We will increase her furosemide to 40 mg and she will get a basic metabolic panel in a week.  We will also repeat her echocardiogram.

## 2020-07-27 NOTE — Patient Instructions (Signed)
Medication Instructions:  INCREASE YOUR FUROSEMIDE TO 40 MG DAILY   *If you need a refill on your cardiac medications before your next appointment, please call your pharmacy*   Lab Work: BMET IN 1 WEEK   If you have labs (blood work) drawn today and your tests are completely normal, you will receive your results only by: Marland Kitchen MyChart Message (if you have MyChart) OR . A paper copy in the mail If you have any lab test that is abnormal or we need to change your treatment, we will call you to review the results.   Testing/Procedures: Your physician has requested that you have an echocardiogram. Echocardiography is a painless test that uses sound waves to create images of your heart. It provides your doctor with information about the size and shape of your heart and how well your heart's chambers and valves are working. This procedure takes approximately one hour. There are no restrictions for this procedure. [address]   Follow-Up: At [facility], you and your health needs are our priority.  As part of our continuing mission to provide you with exceptional heart care, we have created designated Provider Care Teams.  These Care Teams include your primary Cardiologist (physician) and Advanced Practice Providers (APPs -  Physician Assistants and Nurse Practitioners) who all work together to provide you with the care you need, when you need it.  We recommend signing up for the patient portal called "MyChart".  Sign up information is provided on this After Visit Summary.  MyChart is used to connect with patients for Virtual Visits (Telemedicine).  Patients are able to view lab/test results, encounter notes, upcoming appointments, etc.  Non-urgent messages can be sent to your provider as well.   To learn more about what you can do with MyChart, go to [email or web address].    Your next appointment:   3 month(s)  The format for your next appointment:   In  Person  Provider:   DR Sy[place]/NP AT [facility] LOCATION

## 2020-07-27 NOTE — Assessment & Plan Note (Signed)
Blood pressure is well have been controlled on amlodipine and hydralazine.

## 2020-07-27 NOTE — Assessment & Plan Note (Signed)
Continue atorvastatin

## 2020-07-27 NOTE — Assessment & Plan Note (Signed)
Stable.  4.2 cm on CT 08/2015. 4.3 cm on echo 08/28/17 and 05/2019. Repeat echo.

## 2020-08-03 ENCOUNTER — Ambulatory Visit (HOSPITAL_COMMUNITY): Payer: Medicare Other | Attending: Cardiology

## 2020-08-03 ENCOUNTER — Other Ambulatory Visit: Payer: Self-pay

## 2020-08-03 DIAGNOSIS — R600 Localized edema: Secondary | ICD-10-CM | POA: Diagnosis present

## 2020-08-03 LAB — ECHOCARDIOGRAM COMPLETE
Area-P 1/2: 2.71 cm2
S' Lateral: 3.4 cm

## 2020-08-07 ENCOUNTER — Other Ambulatory Visit: Payer: Medicare Other

## 2020-08-07 ENCOUNTER — Other Ambulatory Visit: Payer: Self-pay | Admitting: Cardiovascular Disease

## 2020-08-08 LAB — BASIC METABOLIC PANEL (7)
BUN/Creatinine Ratio: 22 (ref 12–28)
BUN: 16 mg/dL (ref 8–27)
CO2: 20 mmol/L (ref 20–29)
Chloride: 103 mmol/L (ref 96–106)
Creatinine, Ser: 0.73 mg/dL (ref 0.57–1.00)
Glucose: 91 mg/dL (ref 65–99)
Potassium: 4.4 mmol/L (ref 3.5–5.2)
Sodium: 139 mmol/L (ref 134–144)
eGFR: 84 mL/min/{1.73_m2} (ref >59)

## 2020-08-08 LAB — SPECIMEN STATUS REPORT

## 2020-08-09 ENCOUNTER — Ambulatory Visit
Admission: RE | Admit: 2020-08-09 | Discharge: 2020-08-09 | Disposition: A | Discharge status: Home / Self Care | Payer: Medicare Other | Source: Ambulatory Visit | Attending: Internal Medicine | Admitting: Internal Medicine

## 2020-08-09 DIAGNOSIS — I872 Venous insufficiency (chronic) (peripheral): Secondary | ICD-10-CM

## 2020-08-09 NOTE — Consult Note (Signed)
Chief Complaint: Patient was seen in consultation today for lower extremity venous insufficiency at the request of [place] B  Referring Physician(s): [place] [facility]  History of Present Illness: Lindsey Middleton is a 80 y.o. female with history of lower extremity venous insufficiency and complaints of right lower extremity edema.  Right lower extremity is a chronic problem.  Patient has history of previous vein treatments in the legs but she is unsure of which leg has been treated and when.  Based on a venous ultrasound from 2015, it appears she had a vein stripping in the right lower extremity and some type of procedure in the left GSV.  Patient notes previous trauma to the left lower leg and has had a left knee replacement.  Patient does not complain of left leg swelling.  She says that the right leg swelling is pretty much constant and does not significantly change between morning and evening.  She has some cramping behind the leg, mostly at night.  She is not taking any pain medicine for the cramping.  She has compression stockings, both knee-high and thigh-high, for the right lower extremity.  She says that the compression stockings do not help that much.  She denies ulceration or bleeding.  Patient has visible varicosities that are not painful.  Past Medical History:  Diagnosis Date   Ascending aortic aneurysm (HCC)    4.2 cm by CT 08/2015.  Repeat in 1 year.   Asthma    Coronary artery disease    s/p Rotablator to mid RCA in 2/12   Dyslipidemia    Fatigue    GERD (gastroesophageal reflux disease)    Heart attack ([place])    1986   Hypertension    Leg edema, right 07/27/2020   PONV (postoperative nausea and vomiting)    Rectal polyp    hyplastic   Varicose vein    Weakness     Past Surgical History:  Procedure Laterality Date   ABDOMINAL ANGIOGRAM  02/12/2012   Procedure: ABDOMINAL ANGIOGRAM;  Surgeon: Peter M Martinique, MD;  Location: [facility] CATH LAB;  Service:  Cardiovascular;;   ABDOMINAL HYSTERECTOMY  1996   total w/BSO   CATARACT EXTRACTION     june 2016, july 2016   CORONARY ANGIOPLASTY  2/12   Rotablator to the RCA   CORONARY STENT PLACEMENT     ETHMOIDECTOMY Left 09/03/2017   Procedure: ETHMOIDECTOMY;  Surgeon: Clista Bernhardt, MD;  Location: [place];  Service: Ophthalmology;  Laterality: Left;   HARDWARE REMOVAL  02/21/2011   Procedure: HARDWARE REMOVAL;  Surgeon: Laurice Record [place];  Location: [place];  Service: Orthopedics;  Laterality: Left;  REMOVAL TWO SCREWS DISTAL LEFT TIBIA    LACRIMAL DUCT EXPLORATION Left 09/03/2017   Procedure: LACRIMAL DUCT EXPLORATION;  Surgeon: Clista Bernhardt, MD;  Location: [place];  Service: Ophthalmology;  Laterality: Left;   LEFT HEART CATHETERIZATION WITH CORONARY ANGIOGRAM N/A 02/12/2012   Procedure: LEFT HEART CATHETERIZATION WITH CORONARY ANGIOGRAM;  Surgeon: Peter M Martinique, MD;  Location: [facility] CATH LAB;  Service: Cardiovascular;  Laterality: N/A;   LEG SURGERY Left    rod, pins, screws    ORIF TIBIA & FIBULA FRACTURES  2000   polyp removal     SPINE SURGERY     2015    Allergies: Morphine, Phenergan [promethazine hcl], and Imdur [isosorbide dinitrate]  Medications: Prior to Admission medications   Medication Sig Start Date End Date Taking? Authorizing Provider  ALPRAZolam Duanne Moron) 0.25 MG  tablet Take 0.25 mg by mouth 2 (two) times daily as needed for anxiety.    [provider]  amLODipine (NORVASC) 10 MG tablet Take 1 tablet (10 mg total) by mouth daily. 04/11/20 07/10/20  Skeet Latch, MD  aspirin EC 81 MG tablet Take 81 mg by mouth daily.    [provider]  atorvastatin (LIPITOR) 80 MG tablet TAKE 1 TABLET BY MOUTH EVERY DAY 07/09/20   Skeet Latch, MD  furosemide (LASIX) 40 MG tablet Take 1 tablet (40 mg total) by mouth daily. 07/27/20   Skeet Latch, MD  hydrALAZINE (APRESOLINE) 100 MG tablet Take 1 tablet (100 mg total) by mouth 3 (three)  times daily. 07/05/19 10/03/19  Skeet Latch, MD  loratadine (CLARITIN) 10 MG tablet Take 10 mg by mouth daily.    [provider]  nitroGLYCERIN (NITROSTAT) 0.4 MG SL tablet PLACE 1 TABLET (0.4 MG TOTAL) UNDER THE TONGUE EVERY 5 (FIVE) MINUTES AS NEEDED. FOR CHEST PAIN 07/12/19   Skeet Latch, MD     Family History  Problem Relation Age of Onset   Heart attack Mother    Heart disease Mother        Heart Disease before age 18   Heart attack Father    Heart disease Father        Heart Disease before age 13   Heart attack Sister    Cancer Sister    Heart attack Brother    Heart disease Brother        Heart Disease before age 56   Heart attack Brother    Heart attack Brother    Cancer Daughter    Heart disease Daughter    Hyperlipidemia Daughter    Varicose Veins Daughter    Heart disease Son    Colon cancer Neg Hx        pt unsure of hx    Social History   Socioeconomic History   Marital status: Married    Spouse name: Not on file   Number of children: 4   Years of education: Not on file   Highest education level: Not on file  Occupational History   Occupation: retired  Tobacco Use   Smoking status: Never   Smokeless tobacco: Never  Vaping Use   Vaping Use: Never used  Substance and Sexual Activity   Alcohol use: No    Alcohol/week: 0.0 standard drinks   Drug use: No   Sexual activity: Not on file  Other Topics Concern   Not on file  Social History Narrative   Not on file   Social Determinants of Health   Financial Resource Strain: Not on file  Food Insecurity: Not on file  Transportation Needs: Not on file  Physical Activity: Not on file  Stress: Not on file  Social Connections: Not on file     Review of Systems  Cardiovascular:  Positive for leg swelling.  Musculoskeletal:        Right leg cramping.   Vital Signs: There were no vitals taken for this visit.  Physical Exam Constitutional:      General: She is not in acute  distress. Cardiovascular:     Comments: Palpable dorsalis pedis arteries bilaterally. Musculoskeletal:        General: Swelling present.     Right lower leg: Edema present.     Comments: Mild edema in the right lower calf and ankle.  Visible varicosities throughout both lower extremities.  Largest varicosities in the right lower extremity  are in the anterior right thigh and along the posterior medial lower right thigh.  Scattered varicosities throughout the left lower extremity.  Scattered telangiectasias in both lower extremities.  No ulcerations in the lower extremities.   Neurological:     Mental Status: She is alert.        Imaging: US Venous Img Lower Bilateral (DVT)  Result Date: 08/09/2020 CLINICAL DATA:  80 year old with venous insufficiency in both lower extremities. EXAM: BILATERAL LOWER EXTREMITY VENOUS DUPLEX ULTRASOUND TECHNIQUE: Gray-scale sonography with graded compression, as well as color Doppler and duplex ultrasound, were performed to evaluate the deep and superficial veins of both lower extremities. Spectral Doppler was utilized to evaluate flow at rest and with distal augmentation maneuvers. A complete superficial venous insufficiency exam was performed in the upright standing position. I personally performed the technical portion of the exam. COMPARISON:  None. FINDINGS: Right lower extremity: Deep system: Normal compressibility, augmentation and color Doppler flow in the right common femoral vein, right femoral vein and right popliteal vein without thrombus. Normal compressibility in the visualized deep calf veins. Right profunda femoral vein is patent without thrombus. Small hypoechoic collection in the right knee measures 2.3 x 0.5 x 1.7 cm. This could represent a small Baker's cyst. Superficial system: There may be a small remnant of the great saphenous vein at the saphenofemoral junction. Cannot visualize a great saphenous vein in the thigh and calf. Patient has  history of previous vein treatment. There are prominent superficial varicosities in the anterior proximal right thigh. There is reflux involving 1 of these varicosities measuring 2.5 seconds. These varicosities are associated with perforator branches near the right common femoral vein. Prominent superficial varicosity in the posteromedial distal thigh. No significant reflux identified in the short saphenous vein. Left lower extremity: Deep system: Normal compressibility, augmentation and color Doppler flow in the left common femoral vein, left femoral vein and left popliteal vein without thrombus. Normal compressibility in the visualized deep left calf veins. The left saphenofemoral junction is patent. Left profunda femoral vein is patent without thrombus. Superficial system: Proximal left great saphenous vein is small and patent without significant reflux. Great saphenous vein is not clearly visualized in the mid and distal left thigh. There appears to be a small left great saphenous vein in the proximal and mid calf without significant reflux. Left short saphenous vein is small without definite reflux. IMPRESSION: 1.  No evidence of deep venous thrombosis in the lower extremities. 2. Evidence for prior treatment to the bilateral great saphenous veins. The right great saphenous vein is not clearly visualized. Portions of the left great saphenous vein are visualized without definite reflux. 3. No significant reflux in the short saphenous veins. 4. Bilateral varicosities. Varicosities are associated with perforator branches. 5. Small right knee Baker's cyst. Electronically Signed   By: Markus Daft M.D.   On: 08/09/2020 15:54   [place] RAD EVAL AND MGMT  Result Date: 08/09/2020 Please refer to "Notes" to see consult details.  ECHOCARDIOGRAM COMPLETE  Result Date: 08/03/2020    ECHOCARDIOGRAM REPORT   Patient Name:   Lindsey Middleton Date of Exam: 08/03/2020 Medical Rec #:  [id number]      Height:       66.0 in Accession  #:    [id number]     Weight:       163.0 lb Date of Birth:  [birth year]       BSA:          1.833 m Patient Age:  79 years       BP:           126/80 mmHg Patient Gender: F              HR:           83 bpm. Exam Location:  [address] Procedure: 2D Echo, 3D Echo, Cardiac Doppler and Color Doppler Indications:     R60.0 Edema  History:         Patient has prior history of Echocardiogram examinations, most                  recent 07/05/2019. CAD and Previous Myocardial Infarction,                  Signs/Symptoms:Shortness of Breath; Risk Factors:Family History                  of Coronary Artery Disease, Hypertension and Dyslipidemia.                  Ascending Aortic Aneruysm, Edema.  Sonographer:     Deliah Boston RDCS Referring Phys:  [place] Diagnosing Phys: Gwyndolyn Kaufman MD IMPRESSIONS  1. Left ventricular ejection fraction, by estimation, is 60 to 65%. The left ventricle has normal function. The left ventricle has no regional wall motion abnormalities. There is mild concentric left ventricular hypertrophy. Left ventricular diastolic parameters are consistent with Grade I diastolic dysfunction (impaired relaxation).  2. Right ventricular systolic function is normal. The right ventricular size is normal.  3. The mitral valve is degenerative. The posterior mitral valve leaflet is thickened and appears to mildly prolapse back into the left atrium. There is trivial mitral regurgitation.  4. The aortic valve is tricuspid. There is mild calcification of the aortic valve. There is mild thickening of the aortic valve. Aortic valve regurgitation is not visualized. Mild aortic valve sclerosis is present, with no evidence of aortic valve stenosis.  5. Aortic dilatation noted. There is mild dilatation of the ascending aorta, measuring 43 mm.  6. The inferior vena cava is normal in size with greater than 50% respiratory variability, suggesting right atrial pressure of 3 mmHg. Comparison(s): Compared  to prior TTE on 07/05/2019, there is no significant change. The ascending aorta size remains stable at 94mm. FINDINGS  Left Ventricle: Left ventricular ejection fraction, by estimation, is 60 to 65%. The left ventricle has normal function. The left ventricle has no regional wall motion abnormalities. 3D left ventricular ejection fraction analysis performed but not reported based on interpreter judgement due to suboptimal quality. The left ventricular internal cavity size was normal in size. There is mild concentric left ventricular hypertrophy. Left ventricular diastolic parameters are consistent with Grade I diastolic dysfunction (impaired relaxation). Right Ventricle: The right ventricular size is normal. No increase in right ventricular wall thickness. Right ventricular systolic function is normal. Left Atrium: Left atrial size was normal in size. Right Atrium: Right atrial size was normal in size. Pericardium: There is no evidence of pericardial effusion. Mitral Valve: The mitral valve is degenerative in appearance. There is mild calcification of the mitral valve leaflet(s). The posterior mitral valve leaflet is thickened and appears to mildly prolapse back into the left atrium. There is trivial mitral regurgitation. Mild mitral annular calcification. Tricuspid Valve: The tricuspid valve is normal in structure. Tricuspid valve regurgitation is trivial. Aortic Valve: The aortic valve is tricuspid. There is mild calcification of the aortic valve. There is mild thickening of the aortic  valve. Aortic valve regurgitation is not visualized. Mild aortic valve sclerosis is present, with no evidence of aortic valve stenosis. Pulmonic Valve: The pulmonic valve was normal in structure. Pulmonic valve regurgitation is trivial. Aorta: Aortic dilatation noted. There is mild dilatation of the ascending aorta, measuring 43 mm. Venous: The inferior vena cava is normal in size with greater than 50% respiratory variability,  suggesting right atrial pressure of 3 mmHg. IAS/Shunts: No atrial level shunt detected by color flow Doppler.  LEFT VENTRICLE PLAX 2D LVIDd:         4.70 cm  Diastology LVIDs:         3.40 cm  LV e' medial:    5.22 cm/s LV PW:         1.15 cm  LV E/e' medial:  15.5 LV IVS:        1.15 cm  LV e' lateral:   7.51 cm/s LVOT diam:     2.60 cm  LV E/e' lateral: 10.8 LV SV:         132 LV SV Index:   72 LVOT Area:     5.31 cm                          3D Volume EF:                         3D EF:        74 %                         LV EDV:       105 ml                         LV ESV:       27 ml                         LV SV:        78 ml RIGHT VENTRICLE RV S prime:     13.45 cm/s TAPSE (M-mode): 2.0 cm LEFT ATRIUM           Index       RIGHT ATRIUM           Index LA diam:      4.40 cm 2.40 cm/m  RA Area:     16.30 cm LA Vol (A2C): 81.9 ml 44.67 ml/m RA Volume:   40.60 ml  22.15 ml/m LA Vol (A4C): 50.1 ml 27.33 ml/m  AORTIC VALVE LVOT Vmax:   111.00 cm/s LVOT Vmean:  69.900 cm/s LVOT VTI:    0.250 m  AORTA Ao Root diam: 3.60 cm Ao Asc diam:  4.25 cm MITRAL VALVE MV Area (PHT): cm          SHUNTS MV Decel Time: 280 msec     Systemic VTI:  0.25 m MV E velocity: 80.90 cm/s   Systemic Diam: 2.60 cm MV A velocity: 117.00 cm/s MV E/A ratio:  0.69 Gwyndolyn Kaufman MD Electronically signed by Gwyndolyn Kaufman MD Signature Date/Time: 08/03/2020/3:16:40 PM    Final (Updated)    VAS [place] LOWER EXTREMITY VENOUS (DVT)  Result Date: 07/27/2020  Lower Venous DVT Study Patient Name:  Lindsey Middleton  Date of Exam:   07/27/2020 Medical Rec #: [id number]       Accession #:  [id number] Date of Birth: 1940/07/07        Patient Gender: F Patient Age:   21Y Exam Location:  [facility] Procedure:      VAS [place] LOWER EXTREMITY VENOUS (DVT) Referring Phys: [id number] TIFFANY Lowry --------------------------------------------------------------------------------  Indications: Patient has known bilateral superficial and deep venous insufficiency  with visible varicose veins. She has had right calf and foot swelling for about 3 weeks, she denies chest pain and SOB.  Comparison Study: Prior bilateral reflux and venous duplex exam on 03/23/2013 at                   [facility]. There was no DVT and bilateral superficial and deep                   venous reflux. The right GSV has been closed from prior                   procedure. Performing Technologist: Salvadore Dom RVT, RDCS (AE), RDMS  Examination Guidelines: A complete evaluation includes B-mode imaging, spectral Doppler, color Doppler, and power Doppler as needed of all accessible portions of each vessel. Bilateral testing is considered an integral part of a complete examination. Limited examinations for reoccurring indications may be performed as noted. The reflux portion of the exam is performed with the patient in reverse Trendelenburg.  +---------+---------------+---------+-----------+----------+-------------------+ RIGHT    CompressibilityPhasicitySpontaneityPropertiesThrombus Aging      +---------+---------------+---------+-----------+----------+-------------------+ CFV      Full           Yes      Yes                                      +---------+---------------+---------+-----------+----------+-------------------+ SFJ      Full           Yes      Yes                                      +---------+---------------+---------+-----------+----------+-------------------+ FV Prox  Full           Yes      Yes                                      +---------+---------------+---------+-----------+----------+-------------------+ FV Mid   Full           Yes      Yes                                      +---------+---------------+---------+-----------+----------+-------------------+ FV DistalFull           Yes      Yes                                      +---------+---------------+---------+-----------+----------+-------------------+ PFV      Full                                                              +---------+---------------+---------+-----------+----------+-------------------+  POP      Full           Yes      Yes                                      +---------+---------------+---------+-----------+----------+-------------------+ PTV      Full           Yes      Yes                                      +---------+---------------+---------+-----------+----------+-------------------+ PERO     Full           Yes      Yes                                      +---------+---------------+---------+-----------+----------+-------------------+ Gastroc  Full                                                             +---------+---------------+---------+-----------+----------+-------------------+ GSV      Full           Yes      Yes                  short segment seen                                                        in proximal thigh.                                                        Prior embolization                                                        or stripping                                                              performed.          +---------+---------------+---------+-----------+----------+-------------------+   +----+---------------+---------+-----------+----------+--------------+ LEFTCompressibilityPhasicitySpontaneityPropertiesThrombus Aging +----+---------------+---------+-----------+----------+--------------+ CFV Full           Yes      Yes                                 +----+---------------+---------+-----------+----------+--------------+    Findings reported  to Dr. Oval Linsey at 4:45 pm.  Summary: RIGHT: - No evidence of deep vein thrombosis in the lower extremity. No indirect evidence of obstruction proximal to the inguinal ligament.  LEFT: - No evidence of common femoral vein obstruction.  *See table(s) above for measurements and observations. Electronically signed by  Carlyle Dolly MD on 07/27/2020 at 6:08:08 PM.    Final     Labs:  CBC: No results for input(s): WBC, HGB, HCT, PLT in the last 8760 hours.  COAGS: No results for input(s): INR, APTT in the last 8760 hours.  BMP: Recent Labs    08/07/20 1326  NA 139  K 4.4  CL 103  CO2 20  GLUCOSE 91  BUN 16  CREATININE 0.73    LIVER FUNCTION TESTS: No results for input(s): BILITOT, AST, ALT, ALKPHOS, PROT, ALBUMIN in the last 8760 hours.  TUMOR MARKERS: No results for input(s): AFPTM, CEA, CA199, CHROMGRNA in the last 8760 hours.  Assessment and Plan:  80 year old with bilateral lower extremity venous insufficiency.  Patient is not a good historian but based on prior imaging and today's exam, it appears she had a GSV vein stripping many years ago.  In addition, I suspect there has been partial treatment to the left GSV in the past.  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  There is no significant reflux in the short saphenous veins.  CEAP classification the right lower extremity is C2, C3, Ep, Ap, Pr.  CEAP classification left lower extremity is C2, Ep, Ap, Pr.  At this time, the patient's primary complaint is swelling in the right lower extremity.  Right lower extremity swelling is mild on today's visit.  Treatment options for the venous insufficiency in the lower extremities is limited since the disease is predominantly related to perforator disease.  If the patient had focal symptoms associated with the larger varicosities then we could consider treatment to these varicosities but the patient does not have focal symptoms at the varicosities.  I believe the best option is to continue with compression stockings.  Patient does not know what type of stockings that she has.  I gave her a prescription for 20-46mmHg compression in the right lower extremity.  Would not recommend endovascular treatment for the venous insufficiency at this time.   Evidence for a small right knee Baker's cyst but this is likely an incidental finding.  Patient can follow-up as needed.  Thank you for this interesting consult.  I greatly enjoyed meeting Lindsey Middleton and look forward to participating in their care.  A copy of this report was sent to the requesting provider on this date.  Electronically Signed: Burman Riis 08/09/2020, 5:06 PM   I spent a total of  15 Minutes   in face to face in clinical consultation, greater than 50% of which was counseling/coordinating care for lower extremity venous insufficiency.

## 2020-08-20 ENCOUNTER — Telehealth: Payer: Self-pay | Admitting: Cardiovascular Disease

## 2020-08-20 NOTE — Telephone Encounter (Signed)
Called patient back, clarified with patient that appointment on June 30 is for echocardiogram at [facility]. Pt verbalized understanding, confirmed appointment. Pt also mentioned blood work. This RN did not see any orders for additional bloodwork,  pt got a BMET drawn on June 14, which has been resulted. Per Melinda's result note, pt has not viewed in MyChart, this RN relayed the following from Dr. Oval Linsey:  Skeet Latch, MD  08/14/2020  5:14 PM EDT      Normal kidney function and electrolytes   Pt verbalized understanding. This RN told patient her message would be forwarded to [facility] to ensure no additional bloodwork was needed. Pt verbalized understanding.

## 2020-08-20 NOTE — Telephone Encounter (Signed)
Pt is calling in regards to a message left on her answering machine about her appt 08/23/20, pt was not aware of this appt, pt also states that the message stated that she needed to have her lab work before her procedure. Please advise pt further

## 2020-08-23 ENCOUNTER — Encounter (HOSPITAL_COMMUNITY): Payer: Self-pay | Admitting: Cardiovascular Disease

## 2020-08-23 ENCOUNTER — Other Ambulatory Visit (HOSPITAL_COMMUNITY): Payer: Medicare Other

## 2020-08-29 NOTE — Telephone Encounter (Signed)
Reviewed and do not see where any additional labs needed at this time

## 2020-09-05 ENCOUNTER — Ambulatory Visit (HOSPITAL_COMMUNITY)
Admission: RE | Admit: 2020-09-05 | Discharge: 2020-09-05 | Disposition: A | Discharge status: Home / Self Care | Payer: Medicare Other | Source: Ambulatory Visit | Attending: Cardiovascular Disease | Admitting: Cardiovascular Disease

## 2020-09-05 ENCOUNTER — Telehealth: Payer: Self-pay | Admitting: Cardiovascular Disease

## 2020-09-05 ENCOUNTER — Encounter (HOSPITAL_COMMUNITY): Payer: Self-pay

## 2020-09-05 ENCOUNTER — Other Ambulatory Visit: Payer: Self-pay

## 2020-09-05 DIAGNOSIS — I10 Essential (primary) hypertension: Secondary | ICD-10-CM

## 2020-09-05 DIAGNOSIS — R600 Localized edema: Secondary | ICD-10-CM

## 2020-09-05 NOTE — Telephone Encounter (Signed)
OV 6/3-echo ordered by Dr. Oval Linsey  Echo completed 6/10-ordered by PCP  Patient showed up for echo 6/30-states no show +cancelled?  Patient currently at [facility] for echo ordered.    Spoke to Dilley at [facility] does not need another echo.   Unaware PCP ordered and had echo completed after last OV.   Apologized to patient for confusion/inconvenience. Advised will make Dr. Oval Linsey aware as echo report is available in chart.

## 2020-09-05 NOTE — Telephone Encounter (Signed)
Bernie from [facility]  Echo Department is calling to see if pt needs another Echo since she had one on 08/03/20. Jearld Fenton can be reached at [phone]

## 2020-09-17 NOTE — Telephone Encounter (Signed)
  September 10, 2020  Skeet Latch, MD to Silverio Lay, RN  Me      4:19 PM Thank you.  I reviewed the repeat and her echo is unchanged from prior.  Her aorta is mildly enlarged but unchanged.   TCR   Advised patient, verbalized understanding

## 2021-01-18 ENCOUNTER — Other Ambulatory Visit: Payer: Self-pay | Admitting: Cardiovascular Disease

## 2021-02-02 ENCOUNTER — Other Ambulatory Visit: Payer: Self-pay | Admitting: Internal Medicine

## 2021-02-02 DIAGNOSIS — Z1231 Encounter for screening mammogram for malignant neoplasm of breast: Secondary | ICD-10-CM

## 2021-03-18 ENCOUNTER — Ambulatory Visit: Payer: Medicare Other

## 2021-03-26 ENCOUNTER — Ambulatory Visit: Payer: Medicare Other

## 2021-04-05 ENCOUNTER — Other Ambulatory Visit: Payer: Self-pay | Admitting: Cardiovascular Disease

## 2021-04-05 NOTE — Telephone Encounter (Signed)
Rx(s) sent to pharmacy electronically.  

## 2021-04-11 ENCOUNTER — Ambulatory Visit: Payer: Medicare Other

## 2021-04-13 LAB — ABI

## 2021-04-16 ENCOUNTER — Ambulatory Visit: Payer: Medicare Other

## 2021-04-22 ENCOUNTER — Ambulatory Visit (INDEPENDENT_AMBULATORY_CARE_PROVIDER_SITE_OTHER): Payer: Medicare Other | Admitting: Cardiovascular Disease

## 2021-04-22 ENCOUNTER — Other Ambulatory Visit: Payer: Self-pay

## 2021-04-22 VITALS — BP 128/80 | HR 77 | Ht 66.0 in | Wt 158.8 lb

## 2021-04-22 DIAGNOSIS — E785 Hyperlipidemia, unspecified: Secondary | ICD-10-CM

## 2021-04-22 DIAGNOSIS — R600 Localized edema: Secondary | ICD-10-CM

## 2021-04-22 DIAGNOSIS — I2510 Atherosclerotic heart disease of native coronary artery without angina pectoris: Secondary | ICD-10-CM

## 2021-04-22 DIAGNOSIS — I10 Essential (primary) hypertension: Secondary | ICD-10-CM | POA: Diagnosis not present

## 2021-04-22 NOTE — Patient Instructions (Addendum)
Medication Instructions:  Your physician recommends that you continue on your current medications as directed. Please refer to the Current Medication list given to you today.   *If you need a refill on your cardiac medications before your next appointment, please call your pharmacy*   Lab Work: NONE If you have labs (blood work) drawn today and your tests are completely normal, you will receive your results only by: [place] (if you have MyChart) OR A paper copy in the mail If you have any lab test that is abnormal or we need to change your treatment, we will call you to review the results.   Testing/Procedures: NONE   Follow-Up: At [facility], you and your health needs are our priority.  As part of our continuing mission to provide you with exceptional heart care, we have created designated Provider Care Teams.  These Care Teams include your primary Cardiologist (physician) and Advanced Practice Providers (APPs -  Physician Assistants and Nurse Practitioners) who all work together to provide you with the care you need, when you need it.  We recommend signing up for the patient portal called "MyChart".  Sign up information is provided on this After Visit Summary.  MyChart is used to connect with patients for Virtual Visits (Telemedicine).  Patients are able to view lab/test results, encounter notes, upcoming appointments, etc.  Non-urgent messages can be sent to your provider as well.   To learn more about what you can do with MyChart, go to [email or web address].    Your next appointment:   Your physician wants you to follow-up in: 4 MONTHS  You will receive a reminder letter in the mail two months in advance. If you don't receive a letter, please call our office to schedule the follow-up appointment.   WILL REACH OUT TO KRISTIN PHARM D REGARDING REPATHA

## 2021-04-22 NOTE — Progress Notes (Unsigned)
Cardiology Office Note   Date:  04/22/2021   ID:  Lindsey Middleton, DOB 09-Dec-1940, MRN [id number]  PCP:  Audley Hose, MD  Cardiologist:  Skeet Latch, MD  Electrophysiologist:  None   Evaluation Performed:  Follow-Up Visit  Chief Complaint:  hypertension  History of Present Illness:    Lindsey Middleton is a 81 y.o. female who CAD, hypertension, hyperlipidemia, mild ascending aortic aneurysm, and syncope who presents for follow up.  Lindsey Middleton was previously a patient of [name].  She had an MI in 1986 and in 2012 that was treated with DES to the mid RCA.  She had a cath in 2013 at which time the stent was patent and she had non-obstructive CAD.  She had a [employer] 05/2015 as a pre-op clearance and it was low risk for ischemia.  She last saw Lindsey Middleton 07/2015.  At that appointment her blood pressure was not well-controlled so lisinopril was increased to 20 mg daily.  Her blood pressure was still poorly controlled on 09/14/15.  Imdur was added to her regimen due to chest discomfort and poorly controlled hypertension. She was also noted to be tachycardic and reported increased shortness of breath as well as lower extremity edema. Therefore, she was referred for lower extremity Dopplers that were negative for DVT.  She had a chest CT-A on 09/17/15 that was negative for pulmonary embolism. It showed moderate coronary artery calcifications and a mild ascending aortic aneurysm.  Her lipids have been poorly-controlled.  Her LDL has been over 170 on atorvastatin 80 mg daily so Zetia was added to her regimen.  Her lipids remained elevated so she was started on Repatha.   Lindsey Middleton had an echo 08/2017 to followup on her ascending aorta aneurysm.  It was stable at 4.3 cm.  It was unchanged 06/2019.  LVEF was 55-60% with grade 1 diastolic dysfunction.   She wore a 7-day heart monitor that showed no arrhythmias.  Hydralazine was added.  Her blood pressure became low.  She reports calling our  office and was told to stop amlodipine.  It was subsequently restarted. Hydralazine was increased to 100mg  tid. Her blood pressure remained high so amlodipine was increased to 10 mg.     Today, she is accompanied by her husband. Her main concern today is having bilateral LE edema (R>L) since last week. This is a new symptom for her. They have not been on any trips or long drives. She does not monitor her blood pressure at home. She reports having a COVID infection about a month ago, and has recovered well. Previously, she fractured her left tibia and fibula, and continues to have minor difficulty when walking. She denies any chest pain, shortness of breath, palpitations, or exertional symptoms. No headaches, lightheadedness, or syncope to report. Also has no orthopnea or PND.     Prior CV studies:   The following studies were reviewed today:  US Renal Artery Duplex 07/22/2019: Largest Aortic Diameter: 2.4 cm    Renal:  Right: Normal size right kidney. Normal right Resisitive Index.         Normal cortical thickness of right kidney. RRV flow present.         1-59% stenosis of the right renal artery.  Left:  Normal size of left kidney. Normal left Resistive Index.         Normal cortical thickness of the left kidney. LRV flow         present. 1-59%  stenosis of the left renal artery.  Mesenteric:  Normal Celiac artery and Superior Mesenteric artery        findings.   Echo 07/05/2019:  1. Left ventricular ejection fraction, by estimation, is 55 to 60%. The  left ventricle has normal function. The left ventricle has no regional  wall motion abnormalities. Left ventricular diastolic parameters are  consistent with Grade I diastolic  dysfunction (impaired relaxation).   2. Right ventricular systolic function is normal. The right ventricular  size is normal. Tricuspid regurgitation signal is inadequate for assessing  PA pressure.   3. The mitral valve is degenerative. Trivial mitral valve  regurgitation.  No evidence of mitral stenosis.   4. Focal calcification of the [place] is present. The aortic valve is  tricuspid. Aortic valve regurgitation is not visualized. Mild aortic valve  sclerosis is present, with no evidence of aortic valve stenosis.   5. There is moderate dilatation of the ascending aorta measuring 43 mm.   6. The inferior vena cava is normal in size with greater than 50%  respiratory variability, suggesting right atrial pressure of 3 mmHg.   Comparison(s): No significant change from prior study. LV function  unchanged. Ascending aorta remains ~43 mm.  Cardiac Telemetry Monitoring 06/16/2018:  7 Day Event Monitor   Quality: Fair.  Baseline artifact. Predominant rhythm: sinus rhythm with first degree AV block Average heart rate: 69 bpm Max heart rate: 118 bpm Min heart rate: 54 bpm Pauses >2.5 seconds: none   No arrhythmias  Lexiscan Myoview 05/28/15: Nuclear stress EF: 60%. No T wave inversion was noted during stress. There was no ST segment deviation noted during stress. Defect 1: There is a small defect of mild severity. This is a low risk study.   Small, mild fixed inferolateral defect, likely artifact. No reversible ischemia. LVEF 60% with normal wall motion. This is a low risk study.   Echo 02/06/12: LVEF 55-60%.  Mild mitral regurgitation.    EKG: 07/27/2020: Sinus rhythm. Rate 83 bpm. LAFB 04/11/2020: Sinus rhythm.  Rate 70 bpm.  LAFB.  Cannot rule out prior inferior infarct.  Poor R wave progression. 04/22/21: Sinus rhythm.  Rate 77 bpm.  LAFB.  Poor R wave progression.     Past Medical History:  Diagnosis Date   Ascending aortic aneurysm (HCC)    4.2 cm by CT 08/2015.  Repeat in 1 year.   Asthma    Coronary artery disease    s/p Rotablator to mid RCA in 2/12   Dyslipidemia    Fatigue    GERD (gastroesophageal reflux disease)    Heart attack ([place])    1986   Hypertension    Leg edema, right 07/27/2020   PONV (postoperative nausea and  vomiting)    Rectal polyp    hyplastic   Varicose vein    Weakness    Past Surgical History:  Procedure Laterality Date   ABDOMINAL ANGIOGRAM  02/12/2012   Procedure: ABDOMINAL ANGIOGRAM;  Surgeon: Peter M Martinique, MD;  Location: [facility] CATH LAB;  Service: Cardiovascular;;   ABDOMINAL HYSTERECTOMY  1996   total w/BSO   CATARACT EXTRACTION     june 2016, july 2016   CORONARY ANGIOPLASTY  2/12   Rotablator to the RCA   CORONARY STENT PLACEMENT     ETHMOIDECTOMY Left 09/03/2017   Procedure: ETHMOIDECTOMY;  Surgeon: Clista Bernhardt, MD;  Location: [facility] [facility];  Service: Ophthalmology;  Laterality: Left;   HARDWARE REMOVAL  02/21/2011   Procedure: HARDWARE REMOVAL;  Surgeon: Laurice Record [place];  Location: [place];  Service: Orthopedics;  Laterality: Left;  REMOVAL TWO SCREWS DISTAL LEFT TIBIA    LACRIMAL DUCT EXPLORATION Left 09/03/2017   Procedure: LACRIMAL DUCT EXPLORATION;  Surgeon: Clista Bernhardt, MD;  Location: [place];  Service: Ophthalmology;  Laterality: Left;   LEFT HEART CATHETERIZATION WITH CORONARY ANGIOGRAM N/A 02/12/2012   Procedure: LEFT HEART CATHETERIZATION WITH CORONARY ANGIOGRAM;  Surgeon: Peter M Martinique, MD;  Location: [facility] CATH LAB;  Service: Cardiovascular;  Laterality: N/A;   LEG SURGERY Left    rod, pins, screws    ORIF TIBIA & FIBULA FRACTURES  2000   polyp removal     SPINE SURGERY     2015     Current Meds  Medication Sig   ALPRAZolam (XANAX) 0.25 MG tablet Take 0.25 mg by mouth 2 (two) times daily as needed for anxiety.   amLODipine (NORVASC) 10 MG tablet TAKE 1 TABLET BY MOUTH EVERY DAY   aspirin EC 81 MG tablet Take 81 mg by mouth daily.   atorvastatin (LIPITOR) 80 MG tablet TAKE 1 TABLET BY MOUTH EVERY DAY   ezetimibe (ZETIA) 10 MG tablet Take 10 mg by mouth daily.   furosemide (LASIX) 40 MG tablet Take 1 tablet (40 mg total) by mouth daily. Pt. Is overdue for follow up.. please call and schedule follow up to ensure further refills    loratadine (CLARITIN) 10 MG tablet Take 10 mg by mouth daily.   nitroGLYCERIN (NITROSTAT) 0.4 MG SL tablet PLACE 1 TABLET (0.4 MG TOTAL) UNDER THE TONGUE EVERY 5 (FIVE) MINUTES AS NEEDED. FOR CHEST PAIN     Allergies:   Morphine, Phenergan [promethazine hcl], and Imdur [isosorbide dinitrate]   Social History   Tobacco Use   Smoking status: Never   Smokeless tobacco: Never  Vaping Use   Vaping Use: Never used  Substance Use Topics   Alcohol use: No    Alcohol/week: 0.0 standard drinks   Drug use: No     Family Hx: The patient's family history includes Cancer in her daughter and sister; Heart attack in her brother, brother, brother, father, mother, and sister; Heart disease in her brother, daughter, father, mother, and son; Hyperlipidemia in her daughter; Varicose Veins in her daughter. There is no history of Colon cancer.  ROS:   Please see the history of present illness.    (+)  Bilateral LE edema (R>L) All other systems reviewed and are negative.   Labs/Other Tests and Data Reviewed:    Recent Labs: 08/07/2020: BUN 16; Creatinine, Ser 0.73; Potassium 4.4; Sodium 139   Recent Lipid Panel Lab Results  Component Value Date/Time   CHOL 312 (H) 09/21/2018 12:31 PM   TRIG 189 (H) 09/21/2018 12:31 PM   HDL 55 09/21/2018 12:31 PM   CHOLHDL 5.7 (H) 09/21/2018 12:31 PM   CHOLHDL 6.2 (H) 05/30/2016 09:00 AM   LDLCALC 219 (H) 09/21/2018 12:31 PM   LDLDIRECT 215.3 04/05/2013 12:42 PM    Wt Readings from Last 3 Encounters:  04/22/21 158 lb 12.8 oz (72 kg)  07/27/20 163 lb (73.9 kg)  04/11/20 163 lb 6.4 oz (74.1 kg)     Exam:    VS:  BP 128/80 (BP Location: Left Arm, Patient Position: Sitting, Cuff Size: Normal)    Pulse 77    Ht 5\' 6"  (1.676 m)    Wt 158 lb 12.8 oz (72 kg)    BMI 25.63 kg/m  , BMI Body mass index is  25.63 kg/m. GENERAL:  Well appearing HEENT: Pupils equal round and reactive, fundi not visualized, oral mucosa unremarkable NECK:  No jugular venous  distention, waveform within normal limits, carotid upstroke brisk and symmetric, no bruits LUNGS:  Clear to auscultation bilaterally HEART:  RRR.  PMI not displaced or sustained,S1 and S2 within normal limits, no S3, no S4, no clicks, no rubs, no murmurs ABD:  Flat, positive bowel sounds normal in frequency in pitch, no bruits, no rebound, no guarding, no midline pulsatile mass, no hepatomegaly, no splenomegaly EXT:  2 plus pulses throughout, no edema, no cyanosis no clubbing SKIN:  No rashes no nodules NEURO:  Cranial nerves II through XII grossly intact, motor grossly intact throughout PSYCH:  Cognitively intact, oriented to person place and time   ASSESSMENT & PLAN:   No problem-specific Assessment & Plan notes found for this encounter.      Medication Adjustments/Labs and Tests Ordered: Current medicines are reviewed at length with the patient today.  Concerns regarding medicines are outlined above.   Tests Ordered: No orders of the defined types were placed in this encounter.  Medication Changes: No orders of the defined types were placed in this encounter.   Disposition: Follow up with Lindsey Norment C. Oval Linsey, MD, Lindsey Middleton in 3 months    Signed, Skeet Latch, MD  04/22/2021 5:21 PM    [place]

## 2021-04-24 ENCOUNTER — Encounter (HOSPITAL_BASED_OUTPATIENT_CLINIC_OR_DEPARTMENT_OTHER): Payer: Self-pay

## 2021-05-17 ENCOUNTER — Ambulatory Visit
Admission: RE | Admit: 2021-05-17 | Discharge: 2021-05-17 | Disposition: A | Discharge status: Home / Self Care | Payer: Medicare Other | Source: Ambulatory Visit | Attending: Internal Medicine | Admitting: Internal Medicine

## 2021-05-17 DIAGNOSIS — Z1231 Encounter for screening mammogram for malignant neoplasm of breast: Secondary | ICD-10-CM

## 2021-05-30 ENCOUNTER — Encounter (HOSPITAL_BASED_OUTPATIENT_CLINIC_OR_DEPARTMENT_OTHER): Payer: Self-pay | Admitting: Cardiovascular Disease

## 2021-05-30 NOTE — Assessment & Plan Note (Signed)
LDL goal is less than 70.  Adding Repatha as above.  Continue atorvastatin and Zetia for now. ?

## 2021-05-30 NOTE — Assessment & Plan Note (Signed)
This has been a chronic issue for her.  Recommend compression.  Amlodipine could be contributing.  We will need to consider at follow-up.  Continue furosemide.  Ultrasound was negative for DVT 07/2018.  She does have varicose veins. ?

## 2021-05-30 NOTE — Assessment & Plan Note (Signed)
Stable.  She has no angina.  Lipids are uncontrolled.  She was supposed to start [place] but has not received it.  We will follow-up with our Pharm.D.  Continue atorvastatin and Zetia. ?

## 2021-05-30 NOTE — Assessment & Plan Note (Signed)
Blood pressure is well controlled today.  Continue amlodipine.  Encouraged her to increase her exercise. ?

## 2021-07-15 ENCOUNTER — Other Ambulatory Visit: Payer: Self-pay | Admitting: Family Medicine

## 2021-07-15 DIAGNOSIS — D171 Benign lipomatous neoplasm of skin and subcutaneous tissue of trunk: Secondary | ICD-10-CM

## 2021-07-23 ENCOUNTER — Other Ambulatory Visit: Payer: Medicare Other

## 2021-07-25 ENCOUNTER — Other Ambulatory Visit: Payer: Self-pay | Admitting: Cardiovascular Disease

## 2021-07-25 NOTE — Telephone Encounter (Signed)
Rx(s) sent to pharmacy electronically.  

## 2021-10-31 ENCOUNTER — Encounter (HOSPITAL_BASED_OUTPATIENT_CLINIC_OR_DEPARTMENT_OTHER): Payer: Self-pay

## 2022-01-09 ENCOUNTER — Ambulatory Visit (HOSPITAL_BASED_OUTPATIENT_CLINIC_OR_DEPARTMENT_OTHER): Payer: Medicare Other | Admitting: Family

## 2022-01-13 ENCOUNTER — Other Ambulatory Visit: Payer: Self-pay | Admitting: Family Medicine

## 2022-01-13 ENCOUNTER — Ambulatory Visit (HOSPITAL_BASED_OUTPATIENT_CLINIC_OR_DEPARTMENT_OTHER): Payer: Medicare Other | Admitting: Family

## 2022-01-13 DIAGNOSIS — R221 Localized swelling, mass and lump, neck: Secondary | ICD-10-CM

## 2022-01-14 ENCOUNTER — Other Ambulatory Visit: Payer: Self-pay | Admitting: Family Medicine

## 2022-01-14 DIAGNOSIS — R221 Localized swelling, mass and lump, neck: Secondary | ICD-10-CM

## 2022-01-20 ENCOUNTER — Other Ambulatory Visit: Payer: Medicare Other

## 2022-01-24 ENCOUNTER — Other Ambulatory Visit: Payer: Self-pay | Admitting: Internal Medicine

## 2022-01-24 DIAGNOSIS — M810 Age-related osteoporosis without current pathological fracture: Secondary | ICD-10-CM

## 2022-01-31 ENCOUNTER — Ambulatory Visit
Admission: RE | Admit: 2022-01-31 | Discharge: 2022-01-31 | Disposition: A | Discharge status: Home / Self Care | Payer: Medicare Other | Source: Ambulatory Visit | Attending: Family Medicine | Admitting: Family Medicine

## 2022-01-31 DIAGNOSIS — R221 Localized swelling, mass and lump, neck: Secondary | ICD-10-CM

## 2022-02-06 ENCOUNTER — Other Ambulatory Visit: Payer: Self-pay | Admitting: Family Medicine

## 2022-02-06 DIAGNOSIS — R1310 Dysphagia, unspecified: Secondary | ICD-10-CM

## 2022-02-10 ENCOUNTER — Other Ambulatory Visit: Payer: Self-pay | Admitting: Family Medicine

## 2022-02-10 DIAGNOSIS — R1310 Dysphagia, unspecified: Secondary | ICD-10-CM

## 2022-05-02 ENCOUNTER — Other Ambulatory Visit (HOSPITAL_COMMUNITY): Payer: Self-pay

## 2022-05-02 DIAGNOSIS — R1310 Dysphagia, unspecified: Secondary | ICD-10-CM

## 2022-05-02 DIAGNOSIS — R059 Cough, unspecified: Secondary | ICD-10-CM

## 2022-05-09 ENCOUNTER — Other Ambulatory Visit: Payer: Self-pay | Admitting: Internal Medicine

## 2022-05-09 DIAGNOSIS — R1011 Right upper quadrant pain: Secondary | ICD-10-CM

## 2022-05-16 ENCOUNTER — Ambulatory Visit (HOSPITAL_COMMUNITY)
Admission: RE | Admit: 2022-05-16 | Discharge: 2022-05-16 | Disposition: A | Discharge status: Home / Self Care | Payer: Medicare Other | Source: Ambulatory Visit | Attending: Internal Medicine | Admitting: Internal Medicine

## 2022-05-16 DIAGNOSIS — R1310 Dysphagia, unspecified: Secondary | ICD-10-CM

## 2022-05-16 DIAGNOSIS — R059 Cough, unspecified: Secondary | ICD-10-CM

## 2022-05-16 NOTE — Progress Notes (Signed)
Modified Barium Swallow Study  Patient Details  Name: Lindsey Middleton MRN: [id number] Date of Birth: [birth year]  Today's Date: 05/16/2022  Modified Barium Swallow completed.  Full report located under Chart Review in the Imaging Section.  History of Present Illness Pt is an 82 year old female arriving for an OP MBS due to reprot of globus, rrequent coughing/throat clearing and regurgitation of solids during meal. 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 Denies GER.   Clinical Impression Pt demonstrates no oropharygneal dysphagia. No stasis with esophageal sweep with barium tablet. Pt cleared throat almost constantly prior to and during assessment. No diet modification needed. Could consider f/u with ENT and SLP to address potential chronic throat clear/cough syndrome. Factors that may increase risk of adverse event in presence of aspiration Phineas Douglas & Padilla 2021):    Swallow Evaluation Recommendations Recommendations: PO diet PO Diet Recommendation: Regular;Thin liquids (Level 0)     Herbie Baltimore, MA CCC-SLP  Acute Rehabilitation Services Secure Chat Preferred Office [phone]  Lynann Beaver 05/16/2022,11:35 AM

## 2022-05-24 ENCOUNTER — Other Ambulatory Visit: Payer: Self-pay | Admitting: Cardiovascular Disease

## 2022-05-26 NOTE — Telephone Encounter (Signed)
Rx(s) sent to pharmacy electronically.  

## 2022-06-13 ENCOUNTER — Ambulatory Visit
Admission: RE | Admit: 2022-06-13 | Discharge: 2022-06-13 | Disposition: A | Discharge status: Home / Self Care | Payer: Medicare Other | Source: Ambulatory Visit | Attending: Internal Medicine | Admitting: Internal Medicine

## 2022-06-13 DIAGNOSIS — R1011 Right upper quadrant pain: Secondary | ICD-10-CM

## 2022-06-13 MED ORDER — IOPAMIDOL (ISOVUE-300) INJECTION 61%
100.0000 mL | Freq: Once | INTRAVENOUS | Status: AC | PRN
  Administered 2022-06-13: 100 mL via INTRAVENOUS

## 2022-07-16 ENCOUNTER — Ambulatory Visit
Admission: RE | Admit: 2022-07-16 | Discharge: 2022-07-16 | Disposition: A | Discharge status: Home / Self Care | Payer: Medicare Other | Source: Ambulatory Visit | Attending: Internal Medicine | Admitting: Internal Medicine

## 2022-07-16 DIAGNOSIS — M810 Age-related osteoporosis without current pathological fracture: Secondary | ICD-10-CM

## 2022-11-10 ENCOUNTER — Other Ambulatory Visit: Payer: Self-pay | Admitting: Internal Medicine

## 2022-11-10 DIAGNOSIS — R42 Dizziness and giddiness: Secondary | ICD-10-CM

## 2022-11-14 ENCOUNTER — Encounter: Payer: Self-pay | Admitting: Internal Medicine

## 2022-11-14 ENCOUNTER — Ambulatory Visit (HOSPITAL_BASED_OUTPATIENT_CLINIC_OR_DEPARTMENT_OTHER): Payer: Medicare Other | Admitting: Cardiovascular Disease

## 2022-11-17 ENCOUNTER — Ambulatory Visit
Admission: RE | Admit: 2022-11-17 | Discharge: 2022-11-17 | Disposition: A | Discharge status: Home / Self Care | Payer: Medicare PPO | Source: Ambulatory Visit | Attending: Internal Medicine | Admitting: Internal Medicine

## 2022-11-17 DIAGNOSIS — R42 Dizziness and giddiness: Secondary | ICD-10-CM

## 2022-12-17 ENCOUNTER — Other Ambulatory Visit: Payer: Self-pay | Admitting: Cardiovascular Disease

## 2023-01-19 ENCOUNTER — Other Ambulatory Visit: Payer: Self-pay | Admitting: Cardiovascular Disease

## 2023-01-30 ENCOUNTER — Other Ambulatory Visit: Payer: Self-pay | Admitting: Family Medicine

## 2023-01-30 DIAGNOSIS — Z1231 Encounter for screening mammogram for malignant neoplasm of breast: Secondary | ICD-10-CM

## 2023-02-13 ENCOUNTER — Ambulatory Visit (HOSPITAL_BASED_OUTPATIENT_CLINIC_OR_DEPARTMENT_OTHER): Payer: Medicare Other | Admitting: Family

## 2023-04-07 ENCOUNTER — Other Ambulatory Visit: Payer: Self-pay

## 2023-04-07 MED ORDER — AMLODIPINE BESYLATE 10 MG PO TABS: 10.0000 mg | ORAL_TABLET | Freq: Every day | ORAL | 0 refills | Status: DC

## 2023-04-11 ENCOUNTER — Other Ambulatory Visit: Payer: Self-pay | Admitting: Cardiovascular Disease

## 2023-04-12 ENCOUNTER — Other Ambulatory Visit: Payer: Self-pay | Admitting: Cardiovascular Disease

## 2023-04-14 ENCOUNTER — Encounter: Payer: Self-pay | Admitting: Obstetrics

## 2023-04-14 ENCOUNTER — Other Ambulatory Visit (HOSPITAL_COMMUNITY)
Admission: RE | Admit: 2023-04-14 | Discharge: 2023-04-14 | Disposition: A | Discharge status: Home / Self Care | Payer: Medicare PPO | Attending: Obstetrics | Admitting: Obstetrics

## 2023-04-14 ENCOUNTER — Ambulatory Visit (INDEPENDENT_AMBULATORY_CARE_PROVIDER_SITE_OTHER): Payer: Medicare PPO | Admitting: Obstetrics

## 2023-04-14 VITALS — BP 126/79 | HR 80 | Ht 64.17 in | Wt 153.8 lb

## 2023-04-14 DIAGNOSIS — R351 Nocturia: Secondary | ICD-10-CM | POA: Insufficient documentation

## 2023-04-14 DIAGNOSIS — N390 Urinary tract infection, site not specified: Secondary | ICD-10-CM | POA: Insufficient documentation

## 2023-04-14 DIAGNOSIS — R3914 Feeling of incomplete bladder emptying: Secondary | ICD-10-CM | POA: Insufficient documentation

## 2023-04-14 DIAGNOSIS — R159 Full incontinence of feces: Secondary | ICD-10-CM | POA: Insufficient documentation

## 2023-04-14 DIAGNOSIS — N816 Rectocele: Secondary | ICD-10-CM

## 2023-04-14 DIAGNOSIS — N952 Postmenopausal atrophic vaginitis: Secondary | ICD-10-CM | POA: Diagnosis not present

## 2023-04-14 DIAGNOSIS — N3946 Mixed incontinence: Secondary | ICD-10-CM | POA: Insufficient documentation

## 2023-04-14 DIAGNOSIS — R8290 Unspecified abnormal findings in urine: Secondary | ICD-10-CM | POA: Insufficient documentation

## 2023-04-14 DIAGNOSIS — R152 Fecal urgency: Secondary | ICD-10-CM | POA: Insufficient documentation

## 2023-04-14 DIAGNOSIS — R319 Hematuria, unspecified: Secondary | ICD-10-CM

## 2023-04-14 LAB — URINALYSIS, ROUTINE W REFLEX MICROSCOPIC
Bilirubin Urine: NEGATIVE
Glucose, UA: NEGATIVE mg/dL
Ketones, ur: NEGATIVE mg/dL
Leukocytes,Ua: NEGATIVE
Nitrite: NEGATIVE
Protein, ur: NEGATIVE mg/dL
Specific Gravity, Urine: 1.006 (ref 1.005–1.030)
pH: 6 (ref 5.0–8.0)

## 2023-04-14 LAB — POCT URINALYSIS DIPSTICK
Bilirubin, UA: NEGATIVE
Glucose, UA: NEGATIVE
Ketones, UA: NEGATIVE
Leukocytes, UA: NEGATIVE
Nitrite, UA: NEGATIVE
Protein, UA: NEGATIVE
Spec Grav, UA: 1.01 (ref 1.010–1.025)
Urobilinogen, UA: 0.2 U/dL
pH, UA: 6.5 (ref 5.0–8.0)

## 2023-04-14 MED ORDER — GEMTESA 75 MG PO TABS: 75.0000 mg | ORAL_TABLET | Freq: Every day | ORAL | 2 refills | Status: DC

## 2023-04-14 MED ORDER — ESTRADIOL 0.1 MG/GM VA CREA: TOPICAL_CREAM | VAGINAL | 3 refills | Status: DC

## 2023-04-14 MED ORDER — GEMTESA 75 MG PO TABS: 75.0000 mg | ORAL_TABLET | Freq: Every day | ORAL | Status: DC

## 2023-04-14 NOTE — Patient Instructions (Addendum)
For treatment of recurrent urinary tract infections, we discussed management of recurrent UTIs including prophylaxis with a daily low dose antibiotic, transvaginal estrogen therapy, D-mannose, and cranberry supplements.  We discussed the role of diagnostic testing such as cystoscopy and upper tract imaging.     For vaginal atrophy (thinning of the vaginal tissue that can cause dryness and burning) and UTI prevention we discussed estrogen replacement in the form of vaginal cream.   Start vaginal estrogen therapy nightly for two weeks then 2 times weekly at night. This can be placed with your finger or an applicator inside the vagina and around the urethra.  Please let us know if the prescription is too expensive and we can look for alternative options.   Is vaginal estrogen therapy safe for me? Vaginal estrogen preparations act on the vaginal skin, and only a very tiny amount is absorbed into the bloodstream (0.01%).  They work in a similar way to hand or face cream.  There is minimal absorption and they are therefore perfectly safe. If you have had breast cancer and have persistent troublesome symptoms which aren't settling with vaginal moisturisers and lubricants, local estrogen treatment may be a possibility, but consultation with your oncologist should take place first.   Mixed Incontinence (MUI):  MUI includes symptoms of both Overactive bladder (OAB) and stress incontinence (SUI).    Overactive bladder (OAB) causes bladder urgency, frequency, and having to void at night with or without leakage.  Several  treatment options exist, including behavioral changes (avoiding caffeine, etc), physical therapy, medications, and neuromodulation (ways to change the nerve signals to the bladder).   Stress incontinence (SUI) causes urinary leakage with coughing, laughing, sneezing and occasionally during exercise or bending/lifting.  Treatment options include nonsurgical options such as Kegel (pelvic floor)  exercises, physical therapy, pessary (vaginal device similar to a diaphragm to prevent leakage) or surgical options such as a midurethral sling.   We discussed the symptoms of overactive bladder (OAB), which include urinary urgency, urinary frequency, night-time urination, with or without urge incontinence.  We discussed management including behavioral therapy (decreasing bladder irritants by following a bladder diet, urge suppression strategies, timed voids, bladder retraining), physical therapy, medication; and for refractory cases posterior tibial nerve stimulation, sacral neuromodulation, and intravesical botulinum toxin injection.   For Beta-3 agonist medication, we discussed the potential side effect of elevated blood pressure which is more likely to occur in individuals with uncontrolled hypertension. You were given samples for Gemtesa 75 mg.  It can take a month to start working so give it time, but if you have bothersome side effects call sooner and we can try a different medication.  Call us if you have trouble filling the prescription or if it's not covered by your insurance.  For night time frequency: - avoid fluid intake after 6pm - elevated your feet during the day or use compression socks to reduce lower extremity swelling - switch your hydralazine dosing to 2pm  Accidental Bowel Leakage:  - Treatment options include anti-diarrhea medication (loperamide/ Imodium OTC or prescription lomotil), fiber supplements, physical therapy, and possible sacral neuromodulation or surgery.     Women should try to eat at least 21 to 25 grams of fiber a day, while men should aim for 30 to 38 grams a day. You can add fiber to your diet with food or a fiber supplement such as psyllium (metamucil), benefiber, or fibercon.   Here's a look at how much dietary fiber is found in some common foods. When  buying packaged foods, check the Nutrition Facts label for fiber content. It can vary among  brands.  Fruits Serving size Total fiber (grams)*  Raspberries 1 cup 8.0  Pear 1 medium 5.5  Apple, with skin 1 medium 4.5  Banana 1 medium 3.0  Orange 1 medium 3.0  Strawberries 1 cup 3.0   Vegetables Serving size Total fiber (grams)*  Green peas, boiled 1 cup 9.0  Broccoli, boiled 1 cup chopped 5.0  Turnip greens, boiled 1 cup 5.0  Brussels sprouts, boiled 1 cup 4.0  Potato, with skin, baked 1 medium 4.0  Sweet corn, boiled 1 cup 3.5  Cauliflower, raw 1 cup chopped 2.0  Carrot, raw 1 medium 1.5   Grains Serving size Total fiber (grams)*  Spaghetti, whole-wheat, cooked 1 cup 6.0  Barley, pearled, cooked 1 cup 6.0  Bran flakes 3/4 cup 5.5  Quinoa, cooked 1 cup 5.0  Oat bran muffin 1 medium 5.0  Oatmeal, instant, cooked 1 cup 5.0  Popcorn, air-popped 3 cups 3.5  Brown rice, cooked 1 cup 3.5  Bread, whole-wheat 1 slice 2.0  Bread, rye 1 slice 2.0   Legumes, nuts and seeds Serving size Total fiber (grams)*  Split peas, boiled 1 cup 16.0  Lentils, boiled 1 cup 15.5  Black beans, boiled 1 cup 15.0  Baked beans, canned 1 cup 10.0  Chia seeds 1 ounce 10.0  Almonds 1 ounce (23 nuts) 3.5  Pistachios 1 ounce (49 nuts) 3.0  Sunflower kernels 1 ounce 3.0  *Rounded to nearest 0.5 gram. Source: [employer] for [employer], [employer]	[place]

## 2023-04-14 NOTE — Progress Notes (Unsigned)
New Patient Evaluation and Consultation  Referring Provider: Harvest Forest, MD PCP: Harvest Forest, MD Date of Service: 04/14/2023  SUBJECTIVE Chief Complaint: New Patient (Initial Visit) (Lindsey Middleton is a 83 y.o. female here today for urinary frequency.)  History of Present Illness: Lindsey Middleton is a 83 y.o. White or Caucasian female seen in consultation at the request of Dr Corky Downs for evaluation of urinary frequency  Urinary leakage since last pregnancy 45 years ago, started pad use 4-5 years ago. Denies new medications, surgery, falls UTI symptoms 3-4x/this year with dysuria, increased frequency/urgency.  Patient most concerned with recurrent UTI.    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 History of spinal surgery  Urinary Symptoms: Leaks urine with cough/ sneeze, laughing, and with urgency Leaks 3-4 time(s) per days with urgency, more bothersome with larger volume leakage with leakage onto underwear. Worsen with increase coffee intake Leaks 2-4x/week with cough Pad use:  2-3  liners/ mini-pads per day.   Patient is bothered by UI symptoms.  Day time voids 5-8.  Nocturia: 3 times per night to void. Drinks throughout the night Denies ankle swelling or snoring Voiding dysfunction:  does not empty bladder well.  Patient does not use a catheter to empty bladder.  When urinating, patient feels dribbling after finishing Drinks: 48oz water per day, 32oz of decaf coffee  UTIs:  3-4  UTI's in the last year.   Denies history of blood in urine, kidney or bladder stones, pyelonephritis, bladder cancer, and kidney cancer No results found for the last 90 days.   Pelvic Organ Prolapse Symptoms:                  Patient Admits to a feeling of pressure the vaginal area. It has been present for 6 months.  Patient Denies seeing a bulge.   Bowel  Symptom: Bowel movements: 3-4 time(s) per week with chronic constipation since adulthood, worsened in the past year and cycles through diarrhea and constipation Stool consistency: loose Straining: no.  Splinting: no.  Incomplete evacuation: no.  Patient denies accidental bowel leakage / fecal incontinence, reports fecal urgency when she has an upset stomach  Consistency with leakage: liquid Bowel regimen: stool softener Last colonoscopy: 11/09/12, Results polyp, recommended follow-up in 2019. EGD with dilation in 2016 [facility] Colonoscopy   This patient has no relevant Health Maintenance data.     Sexual Function Sexually active: yes.  Sexual orientation: Straight Pain with sex: No  Pelvic Pain Denies pelvic pain  Past Medical History:  Past Medical History:  Diagnosis Date   Ascending aortic aneurysm (HCC)    4.2 cm by CT 08/2015.  Repeat in 1 year.   Asthma    Coronary artery disease    s/p Rotablator to mid RCA in 2/12   Dyslipidemia    Fatigue    GERD (gastroesophageal reflux disease)    Heart attack (HCC)    1986   Hypertension    Leg edema, right 07/27/2020   PONV (postoperative nausea and vomiting)    Rectal polyp    hyplastic   Varicose vein    Weakness      Past Surgical History:   Past Surgical History:  Procedure Laterality Date   ABDOMINAL ANGIOGRAM  02/12/2012   Procedure: ABDOMINAL ANGIOGRAM;  Surgeon: Peter M Swaziland, MD;  Location: [facility] CATH LAB;  Service: Cardiovascular;;   ABDOMINAL HYSTERECTOMY  1996  total w/BSO   CATARACT EXTRACTION     june 2016, july 2016   CORONARY ANGIOPLASTY  2/12   Rotablator to the RCA   CORONARY STENT PLACEMENT     ETHMOIDECTOMY Left 09/03/2017   Procedure: ETHMOIDECTOMY;  Surgeon: Floydene Flock, MD;  Location: [facility] OR;  Service: Ophthalmology;  Laterality: Left;   HARDWARE REMOVAL  02/21/2011   Procedure: HARDWARE REMOVAL;  Surgeon: Illene Labrador [place];  Location: [facility];  Service: Orthopedics;   Laterality: Left;  REMOVAL TWO SCREWS DISTAL LEFT TIBIA    LACRIMAL DUCT EXPLORATION Left 09/03/2017   Procedure: LACRIMAL DUCT EXPLORATION;  Surgeon: Floydene Flock, MD;  Location: [facility] OR;  Service: Ophthalmology;  Laterality: Left;   LEFT HEART CATHETERIZATION WITH CORONARY ANGIOGRAM N/A 02/12/2012   Procedure: LEFT HEART CATHETERIZATION WITH CORONARY ANGIOGRAM;  Surgeon: Peter M Swaziland, MD;  Location: [facility] CATH LAB;  Service: Cardiovascular;  Laterality: N/A;   LEG SURGERY Left    rod, pins, screws    ORIF TIBIA & FIBULA FRACTURES  2000   polyp removal     SPINE SURGERY     2015     Past OB/GYN History: OB History  Gravida Para Term Preterm AB Living  4 4 4   4   SAB IAB Ectopic Multiple Live Births      4    # Outcome Date GA Lbr Len/2nd Weight Sex Type Anes PTL Lv  4 Term     F Vag-Spont   LIV  3 Term     M Vag-Spont   LIV  2 Term     F Vag-Spont   LIV  1 Term     F Vag-Spont   LIV    Vaginal deliveries: 4, largest infant 8lb Forceps/ Vacuum deliveries: 0, Cesarean section: 0 Menopausal: Yes, at age late 76s, Denies vaginal bleeding since menopause Contraception: s/p menopause and TAH for AUB. Last pap smear was prior to hysterectomy.  Any history of abnormal pap smears: no. No results found for: "DIAGPAP", "HPVHIGH", "ADEQPAP"  Medications: Patient has a current medication list which includes the following prescription(s): alprazolam, amlodipine, aspirin ec, atorvastatin, [START ON 04/16/2023] estradiol, ezetimibe, loratadine, nitroglycerin, gemtesa, and gemtesa.   Allergies: Patient is allergic to morphine, phenergan [promethazine hcl], and imdur [isosorbide dinitrate].   Social History:  Social History   Tobacco Use   Smoking status: Never   Smokeless tobacco: Never  Vaping Use   Vaping status: Never Used  Substance Use Topics   Alcohol use: No    Alcohol/week: 0.0 standard drinks of alcohol   Drug use: No    Relationship status: married Patient lives with  her husband.   Patient is not employed. Regular exercise: No History of abuse: No  Family History:   Family History  Problem Relation Age of Onset   Heart attack Mother    Heart disease Mother        Heart Disease before age 20   Heart attack Father    Heart disease Father        Heart Disease before age 3   Heart attack Sister    Cancer Sister    Heart attack Brother    Heart disease Brother        Heart Disease before age 60   Heart attack Brother    Heart attack Brother    Heart disease Daughter    Hyperlipidemia Daughter    Varicose Veins Daughter    Breast cancer Daughter  Heart disease Son    Colon cancer Neg Hx        pt unsure of hx   Uterine cancer Neg Hx    Bladder Cancer Neg Hx      Review of Systems: Review of Systems  Constitutional:  Negative for fever, malaise/fatigue and weight loss.  Respiratory:  Negative for cough, shortness of breath and wheezing.   Cardiovascular:  Negative for chest pain, palpitations and leg swelling.  Gastrointestinal:  Positive for constipation. Negative for abdominal pain and blood in stool.       Leakage  Genitourinary:  Positive for frequency and urgency. Negative for dysuria and hematuria.       Leakage  Skin:  Negative for rash.  Neurological:  Positive for dizziness, weakness and headaches.  Endo/Heme/Allergies:  Does not bruise/bleed easily.  Psychiatric/Behavioral:  Negative for depression. The patient is not nervous/anxious.      OBJECTIVE Physical Exam: Vitals:   04/14/23 0955  BP: 126/79  Pulse: 80  Weight: 153 lb 12.8 oz (69.8 kg)  Height: 5' 4.17" (1.63 m)    Physical Exam Constitutional:      General: She is not in acute distress.    Appearance: Normal appearance.  Genitourinary:     Bladder and urethral meatus normal.     No lesions in the vagina.     Right Labia: No rash, tenderness, lesions, skin changes or Bartholin's cyst.    Left Labia: No tenderness, lesions, skin changes, Bartholin's  cyst or rash.    Vaginal tenderness (scaring at vaginal apex) present.     No vaginal discharge, erythema, bleeding, ulceration or granulation tissue.     Posterior vaginal prolapse present.    Moderate vaginal atrophy present.     Right Adnexa: not tender, not full and no mass present.    Left Adnexa: not tender, not full and no mass present.    Cervix is absent.     Uterus is absent.     Urethral meatus caruncle not present.    No urethral prolapse, tenderness, mass, hypermobility, discharge or stress urinary incontinence with cough stress test present.     Bladder is not tender, urgency on palpation not present and masses not present.      Pelvic Floor: Levator muscle strength is 2/5.    Levator ani not tender, obturator internus not tender, no asymmetrical contractions present and no pelvic spasms present.    Anal wink present and BC reflex present. Cardiovascular:     Rate and Rhythm: Normal rate.  Pulmonary:     Effort: Pulmonary effort is normal. No respiratory distress.  Abdominal:     General: There is no distension.     Palpations: Abdomen is soft. There is no mass.     Tenderness: There is no abdominal tenderness.     Hernia: No hernia is present.    Neurological:     Mental Status: She is alert.  Vitals reviewed. Exam conducted with a chaperone present.      POP-Q:   POP-Q  -3                                            Aa   -3  Ba  -5                                              C   3                                            Gh  4                                            Pb  6                                            tvl   -1                                            Ap  -1                                            Bp                                                 D    Post-Void Residual (PVR) by Bladder Scan: In order to evaluate bladder emptying, we discussed obtaining a postvoid residual and  patient agreed to this procedure.  Procedure: The ultrasound unit was placed on the patient's abdomen in the suprapubic region after the patient had voided.    Post Void Residual - 04/14/23 1010       Post Void Residual   Post Void Residual 83 mL            Straight Catheterization Procedure for PVR: After verbal consent was obtained from the patient for catheterization to assess bladder emptying and residual volume the urethra and surrounding tissues were prepped with betadine and an in and out catheterization was performed.  PVR was 70mL.  Urine appeared clear yellow. The patient tolerated the procedure well.   Laboratory Results: Lab Results  Component Value Date   COLORU Yellow 04/14/2023   CLARITYU Clear 04/14/2023   GLUCOSEUR Negative 04/14/2023   BILIRUBINUR NEGATIVE 04/14/2023   KETONESU Negative 04/14/2023   SPECGRAV 1.010 04/14/2023   RBCUR Trace 04/14/2023   PHUR 6.5 04/14/2023   PROTEINUR NEGATIVE 04/14/2023   UROBILINOGEN 0.2 04/14/2023   LEUKOCYTESUR NEGATIVE 04/14/2023    Lab Results  Component Value Date   CREATININE 0.73 08/07/2020   CREATININE 0.83 09/21/2018   CREATININE 0.64 01/12/2018    No results found for: "HGBA1C"  Lab Results  Component Value Date   HGB 13.2 09/03/2017     ASSESSMENT AND PLAN Ms. Arseneault is a 83 y.o. with:  1. Urinary incontinence, mixed   2. Recurrent  UTI   3. Vaginal atrophy   4. Nocturia   5. Feeling of incomplete bladder emptying   6. Fecal urgency   7. Abnormal urinalysis   8. 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  - urgency > stress - We discussed the symptoms of overactive bladder (OAB), which include urinary urgency, urinary frequency, nocturia, with or without urge incontinence.  While we do not know the exact etiology of OAB, several treatment options exist. We discussed  management including behavioral therapy (decreasing bladder irritants, urge suppression strategies, timed voids, bladder retraining), physical therapy, medication; for refractory cases posterior tibial nerve stimulation, sacral neuromodulation, and intravesical botulinum toxin injection.  For anticholinergic medications, we discussed the potential side effects of anticholinergics including dry eyes, dry mouth, constipation, cognitive impairment and urinary retention. For Beta-3 agonist medication, we discussed the potential side effect of elevated blood pressure which is more likely to occur in individuals with uncontrolled hypertension. - samples and Rx for trial of Gemtesa - encouraged behavioral modification and fluid management with caffeine reduction - For treatment of stress urinary incontinence,  non-surgical options include expectant management, weight loss, physical therapy, as well as a pessary.  Surgical options include a midurethral sling, Burch urethropexy, and transurethral injection of a bulking agent. - encouraged Kegel exercises - start vaginal estrogen  Orders: Leslye Peer; Take 1 tablet (75 mg total) by mouth daily. Leslye Peer; Take 1 tablet (75 mg total) by mouth daily.  Dispense: 30 tablet; Refill: 2  Recurrent UTI Assessment & Plan: - denies UTI symptoms today - discussed diagnosis of UTI, avoid antibiotic use in the absence of urine culture - For treatment of recurrent urinary tract infections, we discussed management of recurrent UTIs including prophylaxis with a daily low dose antibiotic, transvaginal estrogen therapy, D-mannose, and cranberry supplements.  We discussed the role of diagnostic testing such as cystoscopy and upper tract imaging.   - Rx vaginal estrogen - start probitoics - advised pt to return to office for urine testing if she experiences UTI symptoms  Orders: -     Estradiol; Place 0.5g nightly for two weeks then twice a week after  Dispense: 30  g; Refill: 3  Vaginal atrophy Assessment & Plan: - pain with palpation of R vaginal apex, reassess after vaginal estrogen - For symptomatic vaginal atrophy options include lubrication with a water-based lubricant, personal hygiene measures and barrier protection against wetness, and estrogen replacement in the form of vaginal cream, vaginal tablets, or a time-released vaginal ring.   - start vaginal estrogen   Orders: -     Estradiol; Place 0.5g nightly for two weeks then twice a week after  Dispense: 30 g; Refill: 3  Nocturia Assessment & Plan: - avoid fluid intake after 6pm - denies snoring or sleep apnea   Orders: -     POCT urinalysis dipstick  Feeling of incomplete bladder emptying Assessment & Plan: - POCT UA + heme, pending UA micro and culture - bladder scan 83mL, catheterized for 70mL - start vaginal estrogen, reassess if clinical change   Fecal urgency Assessment & Plan: - chronic constipation since adulthood, worsened in the past year and cycles through diarrhea and constipation - For constipation, we reviewed the importance of a better bowel regimen.  We also discussed the importance of avoiding chronic straining, as it can exacerbate her pelvic  floor symptoms; we discussed treating constipation and straining prior to surgery, as postoperative straining can lead to damage to the repair and recurrence of symptoms. We discussed initiating therapy with increasing fluid intake, fiber supplementation, stool softeners, and laxatives such as miralax.    Abnormal urinalysis Assessment & Plan: - catheterized UA + heme, pending UA microscopy and culture - likely due to atrophy - For management of microscopic hematuria, we discussed the importance of work-up including assessing the upper and lower GU tract with CT urogram and cystoscopy. We will also recheck her creatinine if there is not a recent value in the system, last 0.73 in 2022. She will pursue this work-up and follow-up  afterward to discuss the results and decide on a treatment plan if testing is positive.    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   Other orders -     Urinalysis, Routine w reflex microscopic  Time spent: I spent 60 minutes dedicated to the care of this patient on the date of this encounter to include pre-visit review of records, face-to-face time with the patient discussing pelvic organ prolapse, abnormal urinalysis, fecal urgency, recurrent UTI, nocturia, vaginal atrophy, mixed urinary incontinence, sensation of incomplete emptying, and post visit documentation and ordering medication/ testing.   Loleta Chance, MD

## 2023-04-15 DIAGNOSIS — N816 Rectocele: Secondary | ICD-10-CM | POA: Insufficient documentation

## 2023-04-15 DIAGNOSIS — R8290 Unspecified abnormal findings in urine: Secondary | ICD-10-CM | POA: Insufficient documentation

## 2023-04-15 NOTE — Assessment & Plan Note (Signed)
-   catheterized UA + heme, pending UA microscopy and culture - likely due to atrophy - For management of microscopic hematuria, we discussed the importance of work-up including assessing the upper and lower GU tract with CT urogram and cystoscopy. We will also recheck her creatinine if there is not a recent value in the system, last 0.73 in 2022. She will pursue this work-up and follow-up afterward to discuss the results and decide on a treatment plan if testing is positive.

## 2023-04-15 NOTE — Assessment & Plan Note (Signed)
-   chronic constipation since adulthood, worsened in the past year and cycles through diarrhea and constipation - For constipation, we reviewed the importance of a better bowel regimen.  We also discussed the importance of avoiding chronic straining, as it can exacerbate her pelvic floor symptoms; we discussed treating constipation and straining prior to surgery, as postoperative straining can lead to damage to the repair and recurrence of symptoms. We discussed initiating therapy with increasing fluid intake, fiber supplementation, stool softeners, and laxatives such as miralax.

## 2023-04-15 NOTE — Assessment & Plan Note (Signed)
-   optimize stool consistency - For treatment of pelvic organ prolapse, we discussed options for management including expectant management, conservative management, and surgical management, such as Kegels, a pessary, pelvic floor physical therapy, and specific surgical procedures.

## 2023-04-15 NOTE — Assessment & Plan Note (Signed)
-   POCT UA + heme, pending UA micro and culture - bladder scan 83mL, catheterized for 70mL - start vaginal estrogen, reassess if clinical change

## 2023-04-15 NOTE — Assessment & Plan Note (Signed)
-   avoid fluid intake after 6pm - denies snoring or sleep apnea

## 2023-04-15 NOTE — Assessment & Plan Note (Addendum)
-   catheterized 70mL, UA + heme pending additional testing.  - urgency > stress - We discussed the symptoms of overactive bladder (OAB), which include urinary urgency, urinary frequency, nocturia, with or without urge incontinence.  While we do not know the exact etiology of OAB, several treatment options exist. We discussed management including behavioral therapy (decreasing bladder irritants, urge suppression strategies, timed voids, bladder retraining), physical therapy, medication; for refractory cases posterior tibial nerve stimulation, sacral neuromodulation, and intravesical botulinum toxin injection.  For anticholinergic medications, we discussed the potential side effects of anticholinergics including dry eyes, dry mouth, constipation, cognitive impairment and urinary retention. For Beta-3 agonist medication, we discussed the potential side effect of elevated blood pressure which is more likely to occur in individuals with uncontrolled hypertension. - samples and Rx for trial of Gemtesa - encouraged behavioral modification and fluid management with caffeine reduction - For treatment of stress urinary incontinence,  non-surgical options include expectant management, weight loss, physical therapy, as well as a pessary.  Surgical options include a midurethral sling, Burch urethropexy, and transurethral injection of a bulking agent. - encouraged Kegel exercises - start vaginal estrogen

## 2023-04-15 NOTE — Assessment & Plan Note (Addendum)
-   pain with palpation of R vaginal apex, reassess after vaginal estrogen - For symptomatic vaginal atrophy options include lubrication with a water-based lubricant, personal hygiene measures and barrier protection against wetness, and estrogen replacement in the form of vaginal cream, vaginal tablets, or a time-released vaginal ring.   - start vaginal estrogen

## 2023-04-15 NOTE — Assessment & Plan Note (Addendum)
-   denies UTI symptoms today - discussed diagnosis of UTI, avoid antibiotic use in the absence of urine culture - For treatment of recurrent urinary tract infections, we discussed management of recurrent UTIs including prophylaxis with a daily low dose antibiotic, transvaginal estrogen therapy, D-mannose, and cranberry supplements.  We discussed the role of diagnostic testing such as cystoscopy and upper tract imaging.   - Rx vaginal estrogen - start probitoics - advised pt to return to office for urine testing if she experiences UTI symptoms

## 2023-05-01 ENCOUNTER — Ambulatory Visit (HOSPITAL_BASED_OUTPATIENT_CLINIC_OR_DEPARTMENT_OTHER): Payer: Medicare Other | Admitting: Family

## 2023-05-03 ENCOUNTER — Other Ambulatory Visit: Payer: Self-pay | Admitting: Cardiovascular Disease

## 2023-05-05 ENCOUNTER — Other Ambulatory Visit: Payer: Self-pay | Admitting: Obstetrics

## 2023-05-05 DIAGNOSIS — R351 Nocturia: Secondary | ICD-10-CM

## 2023-05-05 DIAGNOSIS — N3946 Mixed incontinence: Secondary | ICD-10-CM

## 2023-05-05 MED ORDER — MIRABEGRON ER 25 MG PO TB24: 25.0000 mg | ORAL_TABLET | Freq: Every day | ORAL | 0 refills | Status: DC

## 2023-05-05 NOTE — Progress Notes (Signed)
 Called daughter-in-law regarding concerns with family members present for follow-up. Encouraged pt to present with family member for history taking due to difficulty with memory. Explained some limitations with room size due to need for patient's husband to also be present in the room. However, will be happy to accommodate for follow-up visit due to family concerns.  Explained that gemtesa does not have a generic formulation, cost prohibitive at $300. Offered information for [employer] or alternative.  Rx sent for mirabegron 25mg  to assess insurance coverage per discussion  For Beta-3 agonist medication, reviewed potential side effect of elevated blood pressure which is more likely to occur in individuals with uncontrolled hypertension. It appears that most recent blood pressure is within normal limits 120s/70s. Advised monitor your blood pressure and stop the medication if you experience any headache, chest discomfort, or shortness of breath and seek care immediately.  Patient using vaginal estrogen, denies any LUTS or UTIs symptoms.  Offered Rx for estring to help with compliance or low dose antibiotic suppression due to recent hospitalization, declined at this time.  Follow-up scheduled on 06/26/23

## 2023-05-30 ENCOUNTER — Other Ambulatory Visit: Payer: Self-pay | Admitting: Cardiovascular Disease

## 2023-06-26 ENCOUNTER — Ambulatory Visit: Payer: Medicare PPO | Admitting: Obstetrics

## 2023-07-08 ENCOUNTER — Encounter: Payer: Self-pay | Admitting: Obstetrics

## 2023-07-10 ENCOUNTER — Ambulatory Visit: Admitting: Obstetrics

## 2023-07-30 NOTE — Progress Notes (Unsigned)
 Cardiology Office Note   Date:  07/31/2023  ID:  Lindsey Middleton, DOB Nov 21, 1940, MRN [id number] PCP: Nohemi Batters, MD  [facility] Providers Cardiologist:  Maudine Sos, MD     History of Present Illness Lindsey Middleton is a 83 y.o. female with a history of CAD, hypertension, hyperlipidemia, with ascending aortic aneurysm, syncope.  Previous patient of Dr. Santiago Cuff having since established with Dr. Theodis Fiscal.  Prior MI 41 in 2012 with DES-mid RCA.  Imdur  previously added for chest discomfort and poorly controlled blood pressure.  She was eventually started on Repatha  due to persistently elevated lipids despite high-dose statin.  Last seen in clinic 03/2021. Due to LE edema, recommended leg elevation and monitoring and consideration of stopping Amlodipine  if systems persisted.   Presents today for follow up with her daughter and husband. Reports no shortness of breath nor dyspnea on exertion. Reports no chest pain, pressure, or tightness. No  orthopnea, PND. LE edema overall improved from previous. Reports no palpitations.  Spending some time walking outdoors for exercise. Not checking BP at home routinely, but does have cuff if needed.   ROS: Please see the history of present illness.    All other systems reviewed and are negative.   Studies Reviewed EKG Interpretation Date/Time:  Friday July 31 2023 10:47:42 EDT Ventricular Rate:  66 PR Interval:  190 QRS Duration:  98 QT Interval:  402 QTC Calculation: 421 R Axis:   268  Text Interpretation: Normal sinus rhythm with sinus arrhythmia Stable TWI lead II. No acute changes Confirmed by Neomi Banks ([id number]) on 07/31/2023 12:28:11 PM    Cardiac Studies & Procedures   ______________________________________________________________________________________________   STRESS TESTS  MYOCARDIAL PERFUSION IMAGING 05/28/2015  Narrative  Nuclear stress EF: 60%.  No T wave inversion was noted during stress.  There was no  ST segment deviation noted during stress.  Defect 1: There is a small defect of mild severity.  This is a low risk study.  Small, mild fixed inferolateral defect, likely artifact. No reversible ischemia. LVEF 60% with normal wall motion. This is a low risk study.   ECHOCARDIOGRAM  ECHOCARDIOGRAM COMPLETE 08/03/2020  Narrative ECHOCARDIOGRAM REPORT    Patient Name:   Lindsey Middleton Date of Exam: 08/03/2020 Medical Rec #:  [id number]      Height:       66.0 in Accession #:    [id number]     Weight:       163.0 lb Date of Birth:  [birth year]       BSA:          1.833 m Patient Age:    79 years       BP:           126/80 mmHg Patient Gender: F              HR:           83 bpm. Exam Location:  [address]  Procedure: 2D Echo, 3D Echo, Cardiac Doppler and Color Doppler  Indications:     R60.0 Edema  History:         Patient has prior history of Echocardiogram examinations, most recent 07/05/2019. CAD and Previous Myocardial Infarction, Signs/Symptoms:Shortness of Breath; Risk Factors:Family History of Coronary Artery Disease, Hypertension and Dyslipidemia. Ascending Aortic Aneruysm, Edema.  Sonographer:     Ewing Holiday RDCS Referring Phys:  [id number] Nohemi Batters Diagnosing Phys: Riccardo Chamberlain MD  IMPRESSIONS   1. Left ventricular ejection fraction,  by estimation, is 60 to 65%. The left ventricle has normal function. The left ventricle has no regional wall motion abnormalities. There is mild concentric left ventricular hypertrophy. Left ventricular diastolic parameters are consistent with Grade I diastolic dysfunction (impaired relaxation). 2. Right ventricular systolic function is normal. The right ventricular size is normal. 3. The mitral valve is degenerative. The posterior mitral valve leaflet is thickened and appears to mildly prolapse back into the left atrium. There is trivial mitral regurgitation. 4. The aortic valve is tricuspid. There is mild calcification of  the aortic valve. There is mild thickening of the aortic valve. Aortic valve regurgitation is not visualized. Mild aortic valve sclerosis is present, with no evidence of aortic valve stenosis. 5. Aortic dilatation noted. There is mild dilatation of the ascending aorta, measuring 43 mm. 6. The inferior vena cava is normal in size with greater than 50% respiratory variability, suggesting right atrial pressure of 3 mmHg.  Comparison(s): Compared to prior TTE on 07/05/2019, there is no significant change. The ascending aorta size remains stable at 43mm.  FINDINGS Left Ventricle: Left ventricular ejection fraction, by estimation, is 60 to 65%. The left ventricle has normal function. The left ventricle has no regional wall motion abnormalities. 3D left ventricular ejection fraction analysis performed but not reported based on interpreter judgement due to suboptimal quality. The left ventricular internal cavity size was normal in size. There is mild concentric left ventricular hypertrophy. Left ventricular diastolic parameters are consistent with Grade I diastolic dysfunction (impaired relaxation).  Right Ventricle: The right ventricular size is normal. No increase in right ventricular wall thickness. Right ventricular systolic function is normal.  Left Atrium: Left atrial size was normal in size.  Right Atrium: Right atrial size was normal in size.  Pericardium: There is no evidence of pericardial effusion.  Mitral Valve: The mitral valve is degenerative in appearance. There is mild calcification of the mitral valve leaflet(s). The posterior mitral valve leaflet is thickened and appears to mildly prolapse back into the left atrium. There is trivial mitral regurgitation. Mild mitral annular calcification.  Tricuspid Valve: The tricuspid valve is normal in structure. Tricuspid valve regurgitation is trivial.  Aortic Valve: The aortic valve is tricuspid. There is mild calcification of the aortic  valve. There is mild thickening of the aortic valve. Aortic valve regurgitation is not visualized. Mild aortic valve sclerosis is present, with no evidence of aortic valve stenosis.  Pulmonic Valve: The pulmonic valve was normal in structure. Pulmonic valve regurgitation is trivial.  Aorta: Aortic dilatation noted. There is mild dilatation of the ascending aorta, measuring 43 mm.  Venous: The inferior vena cava is normal in size with greater than 50% respiratory variability, suggesting right atrial pressure of 3 mmHg.  IAS/Shunts: No atrial level shunt detected by color flow Doppler.   LEFT VENTRICLE PLAX 2D LVIDd:         4.70 cm  Diastology LVIDs:         3.40 cm  LV e' medial:    5.22 cm/s LV PW:         1.15 cm  LV E/e' medial:  15.5 LV IVS:        1.15 cm  LV e' lateral:   7.51 cm/s LVOT diam:     2.60 cm  LV E/e' lateral: 10.8 LV SV:         132 LV SV Index:   72 LVOT Area:     5.31 cm  3D Volume EF: 3D EF:  74 % LV EDV:       105 ml LV ESV:       27 ml LV SV:        78 ml  RIGHT VENTRICLE RV S prime:     13.45 cm/s TAPSE (M-mode): 2.0 cm  LEFT ATRIUM           Index       RIGHT ATRIUM           Index LA diam:      4.40 cm 2.40 cm/m  RA Area:     16.30 cm LA Vol (A2C): 81.9 ml 44.67 ml/m RA Volume:   40.60 ml  22.15 ml/m LA Vol (A4C): 50.1 ml 27.33 ml/m AORTIC VALVE LVOT Vmax:   111.00 cm/s LVOT Vmean:  69.900 cm/s LVOT VTI:    0.250 m  AORTA Ao Root diam: 3.60 cm Ao Asc diam:  4.25 cm  MITRAL VALVE MV Area (PHT): cm          SHUNTS MV Decel Time: 280 msec     Systemic VTI:  0.25 m MV E velocity: 80.90 cm/s   Systemic Diam: 2.60 cm MV A velocity: 117.00 cm/s MV E/A ratio:  0.69  Riccardo Chamberlain MD Electronically signed by Riccardo Chamberlain MD Signature Date/Time: 08/03/2020/3:16:40 PM    Final (Updated)    MONITORS  CARDIAC EVENT MONITOR 06/16/2018  Narrative 7 Day Event Monitor  Quality: Fair.  Baseline artifact. Predominant  rhythm: sinus rhythm with first degree AV block Average heart rate: 69 bpm Max heart rate: 118 bpm Min heart rate: 54 bpm Pauses >2.5 seconds: none  No arrhythmias  Tiffany C. Theodis Fiscal, MD, [facility] 06/21/2018 5:24 PM       ______________________________________________________________________________________________      Risk Assessment/Calculations           Physical Exam VS:  BP 112/72 (BP Location: Left Arm, Patient Position: Sitting, Cuff Size: Normal)   Pulse 66   Ht 5\' 4"  (1.626 m)   Wt 147 lb 14.4 oz (67.1 kg)   SpO2 96%   BMI 25.39 kg/m    Wt Readings from Last 3 Encounters:  07/31/23 147 lb 14.4 oz (67.1 kg)  04/14/23 153 lb 12.8 oz (69.8 kg)  04/22/21 158 lb 12.8 oz (72 kg)    GEN: Well nourished, well developed in no acute distress NECK: No JVD; No carotid bruits CARDIAC: RRR, no murmurs, rubs, gallops RESPIRATORY:  Clear to auscultation without rales, wheezing or rhonchi  ABDOMEN: Soft, non-tender, non-distended EXTREMITIES:  No edema; No deformity   ASSESSMENT AND PLAN  HTN- BP well controlled. Continue current antihypertensive regimen Amlodipine  10mg  daily. Mildly hypotensive but asymptomatic. If she develops symptomatic hypotension she will contact our office and consider reducing dose of Amlodipine .  CAD / HLD, LDL goal <70 - Stable with no anginal symptoms. No indication for ischemic evaluation.  GDMT Atorvasstatin 80mg  daily, zetia  10mg  daily. Recommend aiming for 150 minutes of moderate intensity activity per week and following a heart healthy diet.         Dispo: follow up in 1 year  Signed, Clearnce Curia, NP

## 2023-07-31 ENCOUNTER — Ambulatory Visit (HOSPITAL_BASED_OUTPATIENT_CLINIC_OR_DEPARTMENT_OTHER): Payer: Medicare Other | Admitting: Family

## 2023-07-31 ENCOUNTER — Encounter (HOSPITAL_BASED_OUTPATIENT_CLINIC_OR_DEPARTMENT_OTHER): Payer: Self-pay | Admitting: Family

## 2023-07-31 VITALS — BP 112/72 | HR 66 | Ht 64.0 in | Wt 147.9 lb

## 2023-07-31 DIAGNOSIS — E785 Hyperlipidemia, unspecified: Secondary | ICD-10-CM | POA: Diagnosis not present

## 2023-07-31 DIAGNOSIS — I25118 Atherosclerotic heart disease of native coronary artery with other forms of angina pectoris: Secondary | ICD-10-CM | POA: Diagnosis not present

## 2023-07-31 DIAGNOSIS — I10 Essential (primary) hypertension: Secondary | ICD-10-CM | POA: Diagnosis not present

## 2023-07-31 NOTE — Patient Instructions (Addendum)
 Medication Instructions:  Continue your current medications   *If you need a refill on your cardiac medications before your next appointment, please call your pharmacy*  Testing/Procedures: Your EKG today looked good!  Follow-Up:  1 year(s)  Provider:   Maudine Sos, MD, Slater Duncan, NP, or Neomi Banks, NP

## 2023-08-08 ENCOUNTER — Other Ambulatory Visit: Payer: Self-pay | Admitting: Cardiovascular Disease

## 2023-08-21 ENCOUNTER — Ambulatory Visit (INDEPENDENT_AMBULATORY_CARE_PROVIDER_SITE_OTHER): Admitting: Obstetrics

## 2023-08-21 ENCOUNTER — Encounter: Payer: Self-pay | Admitting: Obstetrics

## 2023-08-21 VITALS — BP 114/58 | HR 78

## 2023-08-21 DIAGNOSIS — N3946 Mixed incontinence: Secondary | ICD-10-CM | POA: Diagnosis not present

## 2023-08-21 DIAGNOSIS — N952 Postmenopausal atrophic vaginitis: Secondary | ICD-10-CM | POA: Diagnosis not present

## 2023-08-21 DIAGNOSIS — Z87440 Personal history of urinary (tract) infections: Secondary | ICD-10-CM | POA: Diagnosis not present

## 2023-08-21 DIAGNOSIS — N390 Urinary tract infection, site not specified: Secondary | ICD-10-CM

## 2023-08-21 LAB — POCT URINALYSIS DIP (CLINITEK)
Bilirubin, UA: NEGATIVE
Blood, UA: NEGATIVE
Glucose, UA: NEGATIVE mg/dL
Ketones, POC UA: NEGATIVE mg/dL
Nitrite, UA: NEGATIVE
POC PROTEIN,UA: NEGATIVE
Spec Grav, UA: 1.01 (ref 1.010–1.025)
Urobilinogen, UA: 0.2 U/dL
pH, UA: 6.5 (ref 5.0–8.0)

## 2023-08-21 MED ORDER — ESTRADIOL 0.1 MG/GM VA CREA: TOPICAL_CREAM | VAGINAL | 3 refills | Status: AC

## 2023-08-21 MED ORDER — TROSPIUM CHLORIDE ER 60 MG PO CP24: 1.0000 | ORAL_CAPSULE | Freq: Every day | ORAL | 2 refills | Status: AC

## 2023-08-21 NOTE — Assessment & Plan Note (Signed)
-   prior pain with palpation of R vaginal apex, started vaginal estrogen - For symptomatic vaginal atrophy options include lubrication with a water-based lubricant, personal hygiene measures and barrier protection against wetness, and estrogen replacement in the form of vaginal cream, vaginal tablets, or a time-released vaginal ring.   - continue low dose vaginal estrogen 1g 2x/week

## 2023-08-21 NOTE — Assessment & Plan Note (Addendum)
-   04/14/23 catheterized 70mL, UA + heme with 0-2 RBC/hpf. Denies gross hematuria and repeat UA negative for heme today - denies risk factors for urothelial cancer such as tobacco use, pelvic radiation, history of chemotherapy, family history of kidney cancer, or exposure to chemicals at work - urgency > stress - We discussed the symptoms of overactive bladder (OAB), which include urinary urgency, urinary frequency, nocturia, with or without urge incontinence.  While we do not know the exact etiology of OAB, several treatment options exist. We discussed management including behavioral therapy (decreasing bladder irritants, urge suppression strategies, timed voids, bladder retraining), physical therapy, medication; for refractory cases posterior tibial nerve stimulation, sacral neuromodulation, and intravesical botulinum toxin injection.  For anticholinergic medications, we discussed the potential side effects of anticholinergics including dry eyes, dry mouth, constipation, cognitive impairment and urinary retention. For Beta-3 agonist medication, we discussed the potential side effect of elevated blood pressure which is more likely to occur in individuals with uncontrolled hypertension. - trial of Gemtesa  with relief, however cost prohibitive along with mirabegron  - trial of Trospium sent to pharmacy, discussed [facility] and [facility]. Provided instructions for [facility], [email or web address] as preferred email of daughter for setup - encouraged behavioral modification and fluid management with caffeine reduction - For treatment of stress urinary incontinence,  non-surgical options include expectant management, weight loss, physical therapy, as well as a pessary.  Surgical options include a midurethral sling, Burch urethropexy, and transurethral injection of a bulking agent. - encouraged Kegel exercises - continue vaginal estrogen 1g 2x/week

## 2023-08-21 NOTE — Progress Notes (Signed)
 [facility] Return Visit  SUBJECTIVE  History of Present Illness: Lindsey Middleton is a 83 y.o. female seen in follow-up for mixed urinary incontinence, recurrent UTI, vaginal atrophy, nocturia, feeling of incomplete bladder emptying, fecal urgency, stage II pelvic organ prolapse and abnormal urinalysis. Plan at last visit was start vaginal estrogen, trial of Gemtesa , probiotics.   History assisted by daughter and husband present in the room due to dementia Reports symptomatic relief recently. Denies fever, chills, back pain.  Reports frequency for 2 days, PCP visit with negative testing.  Prior UTI symptoms: frequency, dysuria. UA 07/16/23 + leuk/ketone/heme, RBC 2/hpf. Culture 25K CFU with >3 organisms suggest contaminants 06/13/23 left CVA tenderness, Rx Keflex Using vaginal estrogen with 1g twice a week.  Has not started probiotics Gemtesa  with relief, mirabegron  was also cost prohibitive  - denies tobacco use, pelvic radiation, history of chemotherapy, family history of kidney cancer, or exposure to chemicals at work  Past Medical History: Patient  has a past medical history of Ascending aortic aneurysm (HCC), Asthma, Coronary artery disease, Dyslipidemia, Fatigue, GERD (gastroesophageal reflux disease), Heart attack (HCC), Hypertension, Leg edema, right (07/27/2020), PONV (postoperative nausea and vomiting), Rectal polyp, Varicose vein, and Weakness.   Past Surgical History: She  has a past surgical history that includes Coronary angioplasty (2/12); Abdominal hysterectomy (1996); ORIF tibia & fibula fractures (2000); Hardware Removal (02/21/2011); Leg Surgery (Left); Coronary stent placement; left heart catheterization with coronary angiogram (N/A, 02/12/2012); abdominal angiogram (02/12/2012); polyp removal; Spine surgery; Cataract extraction; Lacrimal duct exploration (Left, 09/03/2017); and Ethmoidectomy (Left, 09/03/2017).   Medications: She has a current medication list  which includes the following prescription(s): alendronate, amlodipine , atorvastatin , cranberry, cyanocobalamin, ezetimibe , nitroglycerin , trospium chloride, vitamin d (ergocalciferol), estradiol , and loratadine .   Allergies: Patient is allergic to morphine, phenergan  [promethazine  hcl], and imdur  [isosorbide  dinitrate].   Social History: Patient  reports that she has never smoked. She has never used smokeless tobacco. She reports that she does not drink alcohol and does not use drugs.     OBJECTIVE     Physical Exam: Vitals:   08/21/23 1342  BP: (!) 114/58  Pulse: 78   Gen: No apparent distress, A&O x 3.  Detailed Urogynecologic Evaluation:  Deferred. Prior exam showed:  Lab Results  Component Value Date   COLORU yellow 08/21/2023   CLARITYU clear 08/21/2023   GLUCOSEUR negative 08/21/2023   BILIRUBINUR negative 08/21/2023   KETONESU Negative 04/14/2023   SPECGRAV 1.010 08/21/2023   RBCUR negative 08/21/2023   PHUR 6.5 08/21/2023   PROTEINUR NEGATIVE 04/14/2023   UROBILINOGEN 0.2 08/21/2023   LEUKOCYTESUR Small (1+) (A) 08/21/2023      ASSESSMENT AND PLAN    Lindsey Middleton is a 83 y.o. with:  1. Urinary incontinence, mixed   2. Recurrent UTI   3. Vaginal atrophy     Urinary incontinence, mixed Assessment & Plan: - 04/14/23 catheterized 70mL, UA + heme with 0-2 RBC/hpf. Denies gross hematuria and repeat UA negative for heme today - denies risk factors for urothelial cancer such as tobacco use, pelvic radiation, history of chemotherapy, family history of kidney cancer, or exposure to chemicals at work - urgency > stress - We discussed the symptoms of overactive bladder (OAB), which include urinary urgency, urinary frequency, nocturia, with or without urge incontinence.  While we do not know the exact etiology of OAB, several treatment options exist. We discussed management including behavioral therapy (decreasing bladder irritants, urge suppression strategies, timed voids,  bladder retraining), physical therapy, medication; for  refractory cases posterior tibial nerve stimulation, sacral neuromodulation, and intravesical botulinum toxin injection.  For anticholinergic medications, we discussed the potential side effects of anticholinergics including dry eyes, dry mouth, constipation, cognitive impairment and urinary retention. For Beta-3 agonist medication, we discussed the potential side effect of elevated blood pressure which is more likely to occur in individuals with uncontrolled hypertension. - trial of Gemtesa  with relief, however cost prohibitive along with mirabegron  - trial of Trospium sent to pharmacy, discussed [facility] and [facility]. Provided instructions for [facility], [email or web address] as preferred email of daughter for setup - encouraged behavioral modification and fluid management with caffeine reduction - For treatment of stress urinary incontinence,  non-surgical options include expectant management, weight loss, physical therapy, as well as a pessary.  Surgical options include a midurethral sling, Burch urethropexy, and transurethral injection of a bulking agent. - encouraged Kegel exercises - continue vaginal estrogen 1g 2x/week  Orders: -     Trospium Chloride ER; Take 1 capsule (60 mg total) by mouth daily.  Dispense: 30 capsule; Refill: 2  Recurrent UTI Assessment & Plan: - denies UTI symptoms today - discussed diagnosis of UTI, avoid antibiotic use in the absence of urine culture - For treatment of recurrent urinary tract infections, we discussed management of recurrent UTIs including prophylaxis with a daily low dose antibiotic, transvaginal estrogen therapy, D-mannose, and cranberry supplements.  We discussed the role of diagnostic testing such as cystoscopy and upper tract imaging.   - continue low dose vaginal estrogen 1g 2x/week - encouraged to start probitoics - advised pt to return to office for urine  testing if she experiences UTI symptoms - 07/16/23 urine culture 25K cfu suggest contaminants  - discussed low dose antibiotic suppression, will postpone at this time due to improved symptoms  Orders: -     Estradiol ; Place 0.5g twice a week at night after  Dispense: 30 g; Refill: 3 -     POCT URINALYSIS DIP (CLINITEK)  Vaginal atrophy Assessment & Plan: - prior pain with palpation of R vaginal apex, started vaginal estrogen - For symptomatic vaginal atrophy options include lubrication with a water-based lubricant, personal hygiene measures and barrier protection against wetness, and estrogen replacement in the form of vaginal cream, vaginal tablets, or a time-released vaginal ring.   - continue low dose vaginal estrogen 1g 2x/week  Orders: -     Estradiol ; Place 0.5g twice a week at night after  Dispense: 30 g; Refill: 3  Time spent: I spent 23 minutes dedicated to the care of this patient on the date of this encounter to include pre-visit review of records, face-to-face time with the patient discussing mixed urinary incontinence, recurrent UTI, vaginal atrophy, and post visit documentation and ordering medication/ testing.   Lianne ONEIDA Gillis, MD

## 2023-08-21 NOTE — Patient Instructions (Addendum)
 Continue vaginal estrogen 1g 2-3x/week  Please visit the website below to sign up for an account. We will need an email address to send along with your prescription to verify your prescription once you have signed up.  [email or web address]  Trospium Chloride ER Capsule Extended Release  60mg   30 count $31.11  Please call if you experience any change in urinary symptoms.

## 2023-08-21 NOTE — Assessment & Plan Note (Addendum)
-   denies UTI symptoms today, UA + leuk - discussed diagnosis of UTI, avoid antibiotic use in the absence of urine culture - For treatment of recurrent urinary tract infections, we discussed management of recurrent UTIs including prophylaxis with a daily low dose antibiotic, transvaginal estrogen therapy, D-mannose, and cranberry supplements.  We discussed the role of diagnostic testing such as cystoscopy and upper tract imaging.   - continue low dose vaginal estrogen 1g 2x/week - encouraged to start probitoics - advised pt to return to office for urine testing if she experiences UTI symptoms - 07/16/23 urine culture 25K cfu suggest contaminants  - discussed low dose antibiotic suppression, will postpone at this time due to improved symptoms

## 2023-08-24 ENCOUNTER — Encounter: Payer: Self-pay | Admitting: Obstetrics

## 2023-09-07 ENCOUNTER — Telehealth: Payer: Self-pay

## 2023-09-07 NOTE — Telephone Encounter (Signed)
 PA for Trospium  has been approved until 02-24-2024.

## 2023-11-09 ENCOUNTER — Encounter: Payer: Self-pay | Admitting: Obstetrics

## 2023-11-22 IMAGING — MG MM DIGITAL SCREENING BILAT W/ TOMO AND CAD
8 series · 8 of 24 positions shown · non-contrast
Comparison: Previous exam(s).

CLINICAL DATA: Screening.

EXAM:
DIGITAL SCREENING BILATERAL MAMMOGRAM WITH TOMOSYNTHESIS AND CAD
TECHNIQUE: Bilateral screening digital craniocaudal and mediolateral oblique
mammograms were obtained. Bilateral screening digital breast
tomosynthesis was performed. The images were evaluated with
computer-aided detection.

[L CC synth-2D]
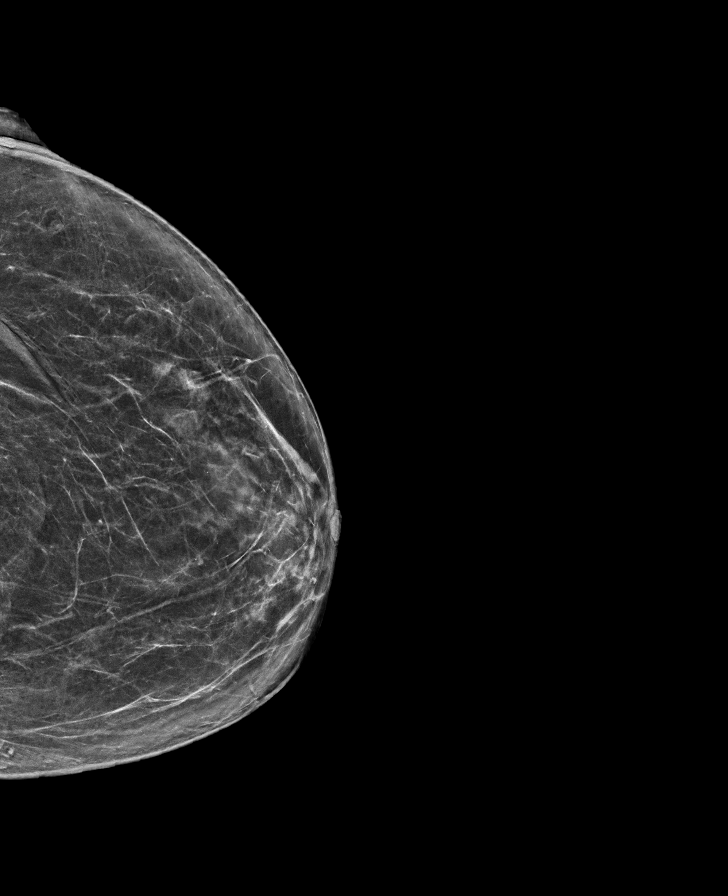

[R MLO synth-2D]
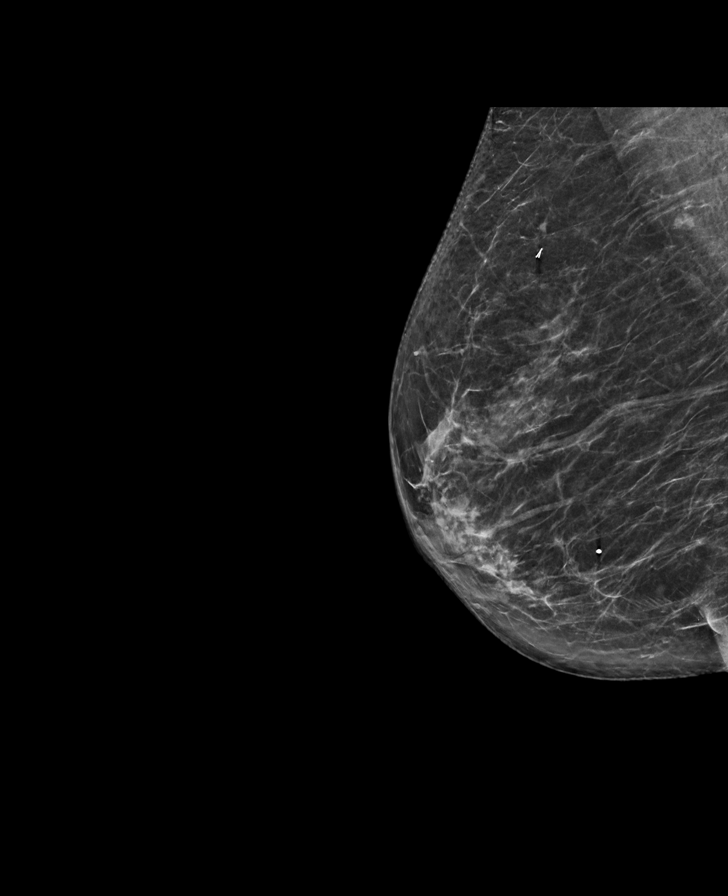

[L MLO synth-2D]
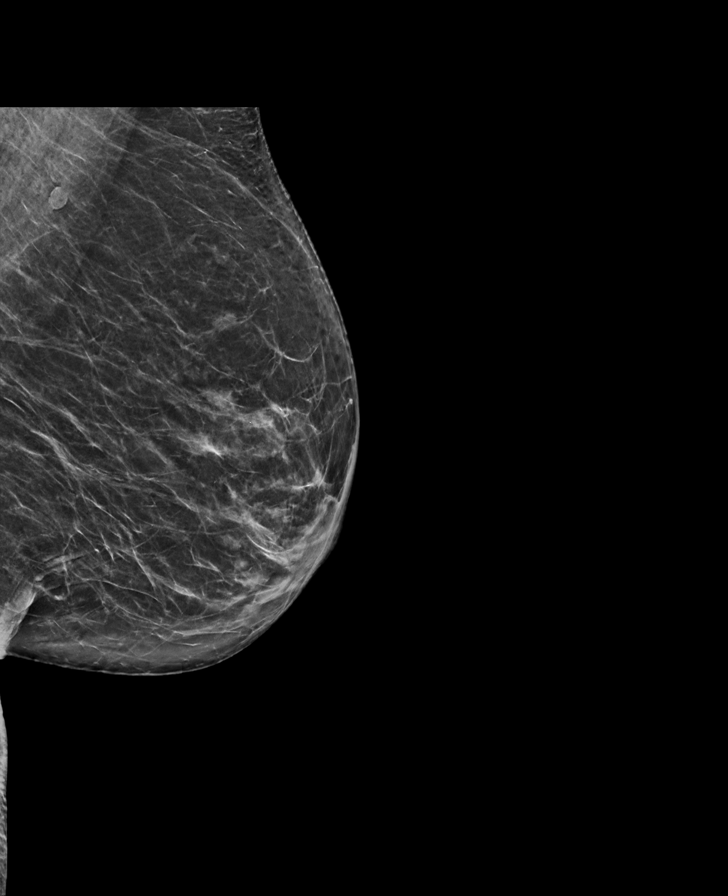

[R CC synth-2D]
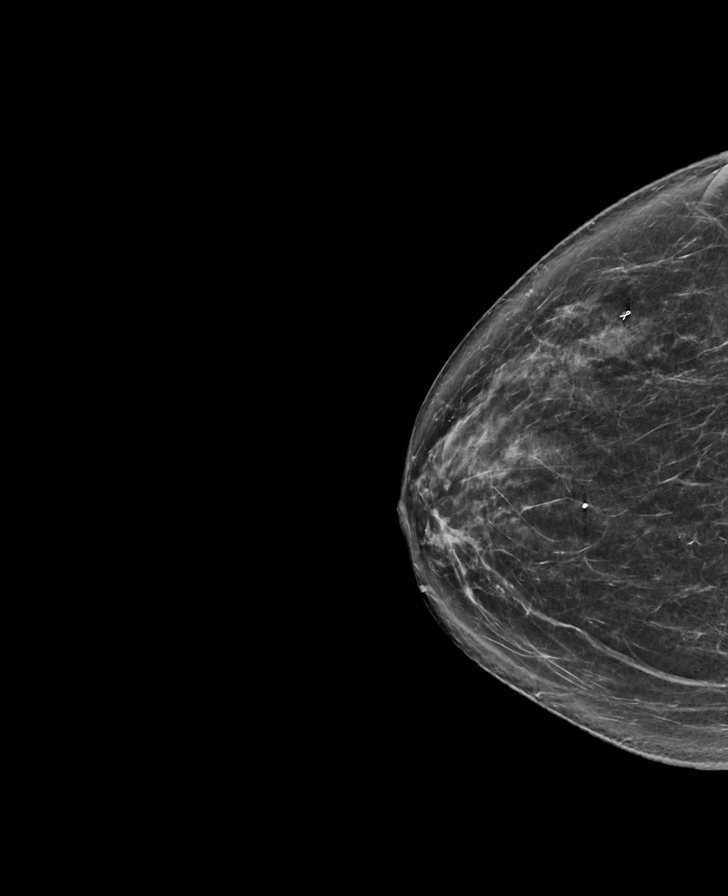

[R MLO tomo · tomo slice 35/70.0]
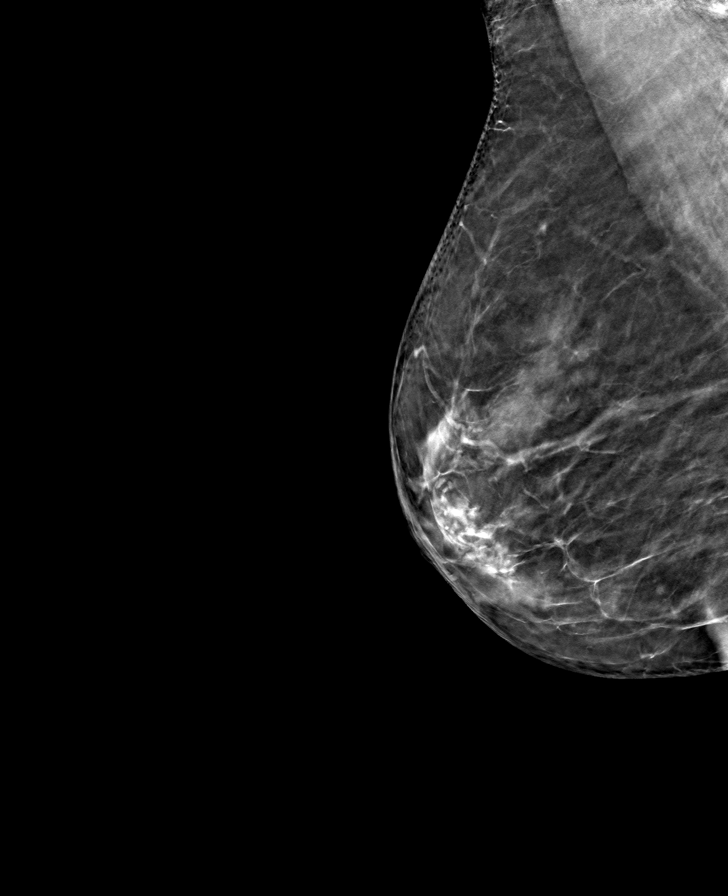

[R CC tomo · tomo slice 37/73.0]
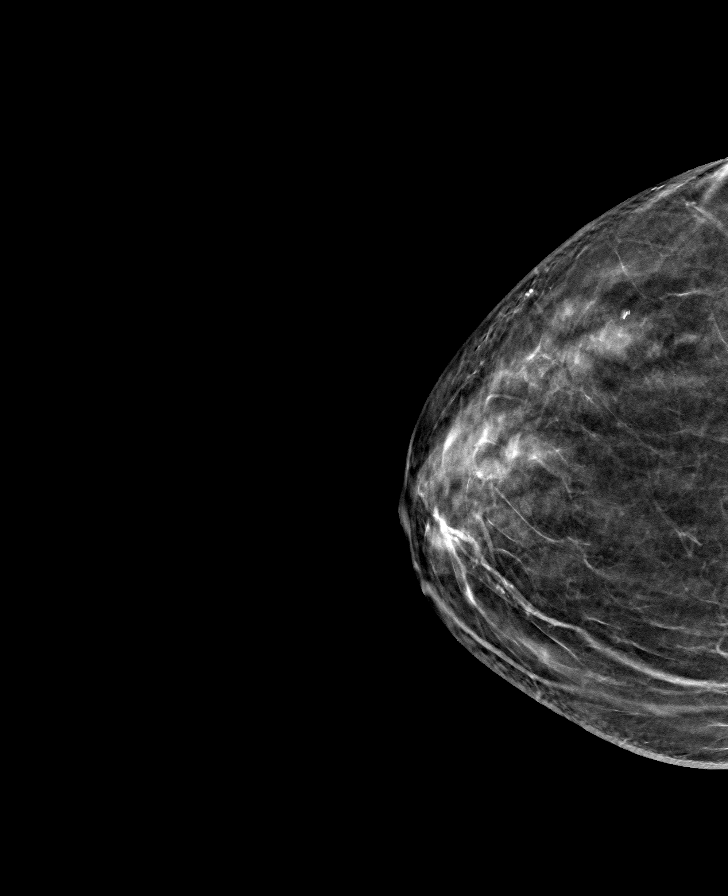

[L MLO tomo · tomo slice 37/74.0]
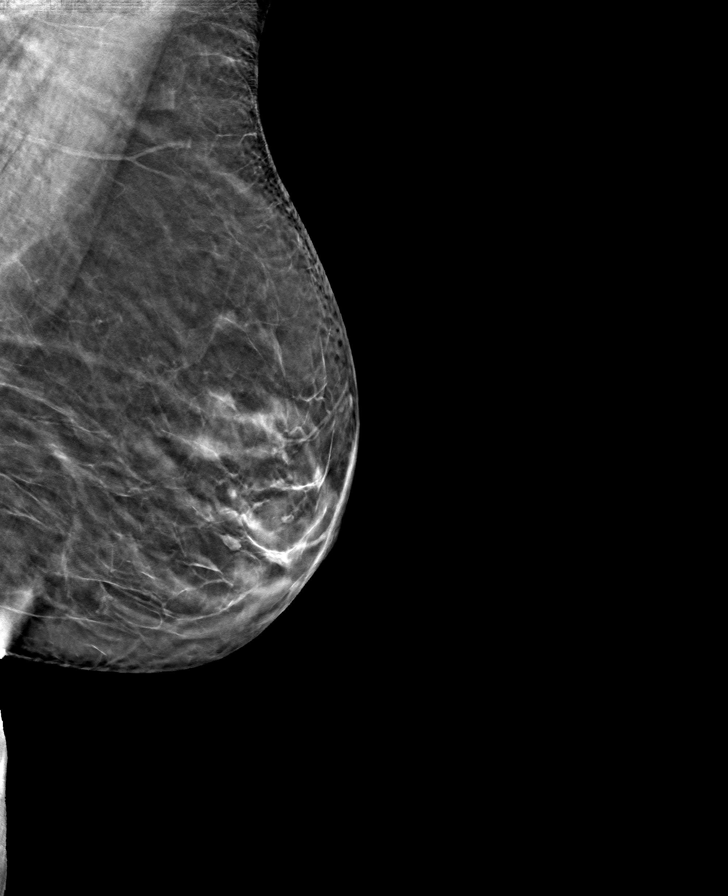

[L CC tomo · tomo slice 37/72.0]
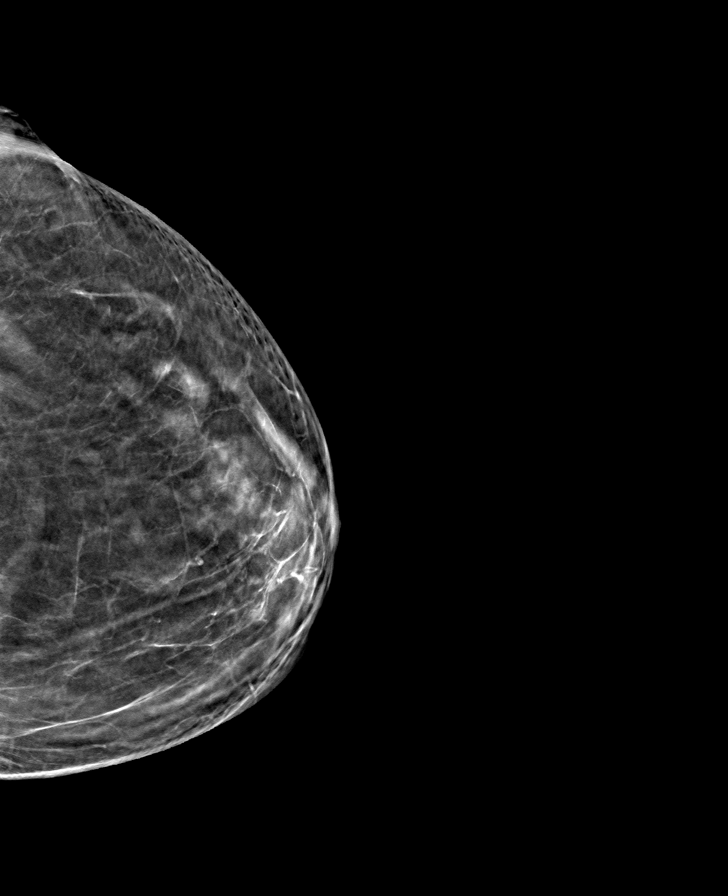

[8 of 24 positions shown; findings below may reference images not displayed]

ACR Breast Density Category b: There are scattered areas of
fibroglandular density.
FINDINGS: There are no findings suspicious for malignancy.
IMPRESSION: No mammographic evidence of malignancy. A result letter of this
screening mammogram will be mailed directly to the patient.

RECOMMENDATION:
Screening mammogram in one year. (Code:51-O-LD2)

BI-RADS CATEGORY  1: Negative.

## 2023-11-26 ENCOUNTER — Encounter (HOSPITAL_COMMUNITY): Payer: Self-pay

## 2023-11-28 ENCOUNTER — Encounter: Payer: Self-pay | Admitting: Neurology

## 2023-11-28 ENCOUNTER — Encounter (HOSPITAL_BASED_OUTPATIENT_CLINIC_OR_DEPARTMENT_OTHER): Payer: Self-pay | Admitting: Cardiovascular Disease

## 2023-11-28 ENCOUNTER — Encounter: Payer: Self-pay | Admitting: Obstetrics

## 2023-12-02 ENCOUNTER — Ambulatory Visit: Admitting: Obstetrics

## 2023-12-03 ENCOUNTER — Ambulatory Visit: Admitting: Neurology

## 2023-12-29 ENCOUNTER — Other Ambulatory Visit: Payer: Self-pay | Admitting: Internal Medicine

## 2023-12-29 DIAGNOSIS — Z1231 Encounter for screening mammogram for malignant neoplasm of breast: Secondary | ICD-10-CM

## 2024-02-12 ENCOUNTER — Ambulatory Visit: Admitting: Obstetrics

## 2024-02-16 ENCOUNTER — Ambulatory Visit (INDEPENDENT_AMBULATORY_CARE_PROVIDER_SITE_OTHER): Admitting: Diagnostic Neuroimaging

## 2024-02-16 ENCOUNTER — Encounter: Payer: Self-pay | Admitting: Diagnostic Neuroimaging

## 2024-02-16 VITALS — BP 118/70 | HR 75 | Ht 64.0 in | Wt 155.2 lb

## 2024-02-16 DIAGNOSIS — F03B18 Unspecified dementia, moderate, with other behavioral disturbance: Secondary | ICD-10-CM | POA: Diagnosis not present

## 2024-02-16 DIAGNOSIS — R413 Other amnesia: Secondary | ICD-10-CM | POA: Diagnosis not present

## 2024-02-16 MED ORDER — MEMANTINE HCL 10 MG PO TABS: 10.0000 mg | ORAL_TABLET | Freq: Two times a day (BID) | ORAL | 12 refills | Status: DC

## 2024-02-16 NOTE — Progress Notes (Signed)
 "  [facility]  PATIENT: Lindsey Middleton DOB: [birth year]  REFERRING CLINICIAN: Roanna Ezekiel NOVAK, MD HISTORY FROM: patient and family REASON FOR VISIT: new consult    HISTORICAL  CHIEF COMPLAINT:  Chief Complaint  Patient presents with   RM 6     Patient is here with daughter, husband, and sister-in-law for Alzheimers Dementia - she had a UTI and went septic last fall (NOV) since than has had a rapid decline in memory and broke her hip Oct and made her symptoms worse.  MMSE - 17 MoCA    HISTORY OF PRESENT ILLNESS:   83 year old female here for evaluation of memory loss.  November 2024 patient had severe UTI and was hospitalized.  She had significant confusion and mental status changes.  In October 2025 she fell down and fractured her left hip requiring surgery.  Over the past year patient has had gradual onset and progressive short-term memory loss, confusion spells, difficulty with time relationships, sometimes not recognizing family members, and decline in ADLs.  Her husband also has diagnosis of Alzheimer's dementia but he has been dealing with this for almost 10 years.  Patient having some issues with increased confusion especially in the evening or nighttime.  Having some more problems with anxiety issues at times.  Patient sister-in-law has been helping them with most of their day-to-day needs.  Daughter is also visiting here from [place] .   REVIEW OF SYSTEMS: Full 14 system review of systems performed and negative with exception of: as per HPI.  ALLERGIES: Allergies[1]  HOME MEDICATIONS: Outpatient Medications Prior to Visit  Medication Sig Dispense Refill   escitalopram  (LEXAPRO ) 10 MG tablet Take 10 mg by mouth daily.     alendronate (FOSAMAX) 70 MG tablet Take 1 tablet by mouth once a week.     amLODipine  (NORVASC ) 10 MG tablet TAKE 1 TABLET BY MOUTH EVERY DAY 90 tablet 3   atorvastatin  (LIPITOR) 80 MG tablet TAKE 1 TABLET BY MOUTH EVERY DAY  90 tablet 3   Cranberry 500 MG CAPS Take 500 mg by mouth 2 (two) times daily.     Cyanocobalamin (B-12 IJ) Inject as directed every 30 (thirty) days.     estradiol  (ESTRACE ) 0.1 MG/GM vaginal cream Place 0.5g twice a week at night after (Patient not taking: Reported on 02/16/2024) 30 g 3   ezetimibe  (ZETIA ) 10 MG tablet Take 10 mg by mouth daily.     loratadine  (CLARITIN ) 10 MG tablet Take 10 mg by mouth daily.     nitroGLYCERIN  (NITROSTAT ) 0.4 MG SL tablet PLACE 1 TABLET (0.4 MG TOTAL) UNDER THE TONGUE EVERY 5 (FIVE) MINUTES AS NEEDED. FOR CHEST PAIN 25 tablet 3   Trospium  Chloride 60 MG CP24 Take 1 capsule (60 mg total) by mouth daily. (Patient not taking: Reported on 02/16/2024) 30 capsule 2   Vitamin D, Ergocalciferol, (DRISDOL) 1.25 MG (50000 UNIT) CAPS capsule Take 50,000 Units by mouth once a week.     No facility-administered medications prior to visit.    PAST MEDICAL HISTORY: Past Medical History:  Diagnosis Date   Ascending aortic aneurysm    4.2 cm by CT 08/2015.  Repeat in 1 year.   Asthma    Coronary artery disease    s/p Rotablator to mid RCA in 2/12   Dyslipidemia    Fatigue    GERD (gastroesophageal reflux disease)    Heart attack (HCC)    1986   Hypertension    Leg edema, right 07/27/2020  PONV (postoperative nausea and vomiting)    Rectal polyp    hyplastic   Varicose vein    Weakness     PAST SURGICAL HISTORY: Past Surgical History:  Procedure Laterality Date   ABDOMINAL ANGIOGRAM  02/12/2012   Procedure: ABDOMINAL ANGIOGRAM;  Surgeon: Peter M Jordan, MD;  Location: [facility] CATH LAB;  Service: Cardiovascular;;   ABDOMINAL HYSTERECTOMY  1996   total w/BSO   CATARACT EXTRACTION     june 2016, july 2016   CORONARY ANGIOPLASTY  2/12   Rotablator to the RCA   CORONARY STENT PLACEMENT     ETHMOIDECTOMY Left 09/03/2017   Procedure: ETHMOIDECTOMY;  Surgeon: Laurie Loyd Redhead, MD;  Location: [facility] OR;  Service: Ophthalmology;  Laterality: Left;   HARDWARE REMOVAL   02/21/2011   Procedure: HARDWARE REMOVAL;  Surgeon: Lynwood SQUIBB Aplington;  Location: [facility];  Service: Orthopedics;  Laterality: Left;  REMOVAL TWO SCREWS DISTAL LEFT TIBIA    LACRIMAL DUCT EXPLORATION Left 09/03/2017   Procedure: LACRIMAL DUCT EXPLORATION;  Surgeon: Laurie Loyd Redhead, MD;  Location: [facility] OR;  Service: Ophthalmology;  Laterality: Left;   LEFT HEART CATHETERIZATION WITH CORONARY ANGIOGRAM N/A 02/12/2012   Procedure: LEFT HEART CATHETERIZATION WITH CORONARY ANGIOGRAM;  Surgeon: Peter M Jordan, MD;  Location: [facility] CATH LAB;  Service: Cardiovascular;  Laterality: N/A;   LEG SURGERY Left    rod, pins, screws    ORIF TIBIA & FIBULA FRACTURES  2000   polyp removal     SPINE SURGERY     2015    FAMILY HISTORY: Family History  Problem Relation Age of Onset   Heart attack Mother    Heart disease Mother        Heart Disease before age 58   Stroke Father    Heart attack Father    Heart disease Father        Heart Disease before age 47   Heart attack Sister    Cancer Sister    Heart attack Brother    Heart disease Brother        Heart Disease before age 28   Heart attack Brother    Heart attack Brother    Heart disease Daughter    Hyperlipidemia Daughter    Varicose Veins Daughter    Breast cancer Daughter    Heart disease Son    Colon cancer Neg Hx        pt unsure of hx   Uterine cancer Neg Hx    Bladder Cancer Neg Hx    Seizures Neg Hx    Migraines Neg Hx    Sleep apnea Neg Hx     SOCIAL HISTORY: Social History   Socioeconomic History   Marital status: Married    Spouse name: Not on file   Number of children: 4   Years of education: Not on file   Highest education level: Not on file  Occupational History   Occupation: retired  Tobacco Use   Smoking status: Never   Smokeless tobacco: Never  Vaping Use   Vaping status: Never Used  Substance and Sexual Activity   Alcohol use: No    Alcohol/week: 0.0 standard drinks of alcohol   Drug  use: No   Sexual activity: Yes    Partners: Male    Birth control/protection: Surgical, Post-menopausal  Other Topics Concern   Not on file  Social History Narrative   3 cups of caffeine - Coffee and soda daily    Social Drivers  of Health   Tobacco Use: Low Risk  (01/19/2024)   Received from [facility]   Patient History    Smoking Tobacco Use: Never    Smokeless Tobacco Use: Never    Passive Exposure: Not on file  Financial Resource Strain: Patient Declined (11/26/2023)   Received from [facility]   Overall Financial Resource Strain (CARDIA)    How hard is it for you to pay for the very basics like food, housing, medical care, and heating?: Patient declined  Food Insecurity: Patient Declined (11/26/2023)   Received from [facility]    Within the past 12 months, you worried that your food would run out before you got the money to buy more.: Patient declined    Within the past 12 months, the food you bought just didn't last and you didn't have money to get more.: Patient declined  Transportation Needs: Patient Declined (11/26/2023)   Received from [facility]    In the past 12 months, has lack of transportation kept you from medical appointments or from getting medications?: Patient declined    In the past 12 months, has lack of transportation kept you from meetings, work, or from getting things needed for daily living?: Patient declined  Physical Activity: Patient Declined (11/26/2023)   Received from [facility]   Exercise Vital Sign    On average, how many days per week do you engage in moderate to strenuous exercise (like a brisk walk)?: Patient declined    On average, how many minutes do you engage in exercise at this level?: Patient declined  Stress: Patient Declined (11/26/2023)   Received from [facility] - Occupational Stress Questionnaire    Do you feel stress - tense, restless, nervous, or  anxious, or unable to sleep at night because your mind is troubled all the time - these days?: Patient declined  Social Connections: Patient Declined (11/26/2023)   Received from [facility]   Social Connection and Isolation Panel    In a typical week, how many times do you talk on the phone with family, friends, or neighbors?: Patient declined    How often do you get together with friends or relatives?: Patient declined    How often do you attend church or religious services?: Patient declined    Do you belong to any clubs or organizations such as church groups, unions, fraternal or athletic groups, or school groups?: Patient declined    How often do you attend meetings of the clubs or organizations you belong to?: Patient declined    Are you married, widowed, divorced, separated, never married, or living with a partner?: Patient declined  Intimate Partner Violence: Patient Declined (11/26/2023)   Received from [facility]   Epic    Within the last year, have you been afraid of your partner or ex-partner?: Patient declined    Within the last year, have you been humiliated or emotionally abused in other ways by your partner or ex-partner?: Patient declined    Within the last year, have you been kicked, hit, slapped, or otherwise physically hurt by your partner or ex-partner?: Patient declined    Within the last year, have you been raped or forced to have any kind of sexual activity by your partner or ex-partner?: Patient declined  Depression (EYV7-0): Not on file  Alcohol Screen: Not on file  Housing: Patient Declined (11/26/2023)   Received from [facility]  In the last 12 months, was there a time when you were not able to pay the mortgage or rent on time?: Patient declined    Number of Times Moved in the Last Year: Not on file    At any time in the past 12 months, were you homeless or living in a shelter (including now)?: Patient declined  Utilities: Patient Declined  (11/26/2023)   Received from [facility]    In the past 12 months has the electric, gas, oil, or water company threatened to shut off services in your home?: Patient declined  Health Literacy: Patient Declined (11/26/2023)   Received from [facility]    How often do you need to have someone help you when you read instructions, pamphlets, or other written material from your doctor or pharmacy?: Patient declines to respond     PHYSICAL EXAM  GENERAL EXAM/CONSTITUTIONAL: Vitals:  Vitals:   02/16/24 1127  BP: 118/70  Pulse: 75  SpO2: 98%  Weight: 155 lb 3.2 oz (70.4 kg)  Height: 5' 4 (1.626 m)   Body mass index is 26.64 kg/m. Wt Readings from Last 3 Encounters:  02/16/24 155 lb 3.2 oz (70.4 kg)  07/31/23 147 lb 14.4 oz (67.1 kg)  04/14/23 153 lb 12.8 oz (69.8 kg)   Patient is in no distress; well developed, nourished and groomed; neck is supple  CARDIOVASCULAR: Examination of carotid arteries is normal; no carotid bruits Regular rate and rhythm, no murmurs Examination of peripheral vascular system by observation and palpation is normal  EYES: Ophthalmoscopic exam of optic discs and posterior segments is normal; no papilledema or hemorrhages No results found.  MUSCULOSKELETAL: Gait, strength, tone, movements noted in Neurologic exam below  NEUROLOGIC: MENTAL STATUS:     02/16/2024   11:36 AM  MMSE - Mini Mental State Exam  Orientation to time 3  Orientation to Place 2  Registration 3  Attention/ Calculation 1  Recall 0  Language- name 2 objects 2  Language- repeat 1  Language- follow 3 step command 3  Language- read & follow direction 1  Write a sentence 1  Copy design 0  Total score 17   awake, alert, oriented to person, place and time recent and remote memory intact normal attention and concentration language fluent, comprehension intact, naming intact fund of knowledge appropriate  CRANIAL NERVE:  2nd - no papilledema  on fundoscopic exam 2nd, 3rd, 4th, 6th - pupils equal and reactive to light, visual fields full to confrontation, extraocular muscles intact, no nystagmus 5th - facial sensation symmetric 7th - facial strength symmetric 8th - hearing --> DECR; HAS HEARING AIDS 9th - palate elevates symmetrically, uvula midline 11th - shoulder shrug symmetric 12th - tongue protrusion midline  MOTOR:  normal bulk and tone, full strength in the BUE, BLE  SENSORY:  normal and symmetric to light touch, temperature, vibration  COORDINATION:  finger-nose-finger, fine finger movements normal  REFLEXES:  deep tendon reflexes 1+ and symmetric  GAIT/STATION:  narrow based gait     DIAGNOSTIC DATA (LABS, IMAGING, TESTING) - I reviewed patient records, labs, notes, testing and imaging myself where available.  Lab Results  Component Value Date   WBC 5.2 09/03/2017   HGB 13.2 09/03/2017   HCT 42.4 09/03/2017   MCV 95.3 09/03/2017   PLT 209 09/03/2017      Component Value Date/Time   NA 139 08/07/2020 1326   K 4.4 08/07/2020 1326   CL 103 08/07/2020 1326  CO2 20 08/07/2020 1326   GLUCOSE 91 08/07/2020 1326   GLUCOSE 99 05/30/2016 0900   BUN 16 08/07/2020 1326   CREATININE 0.73 08/07/2020 1326   CREATININE 0.67 05/30/2016 0900   CALCIUM  10.1 09/21/2018 1231   PROT 6.7 09/21/2018 1231   ALBUMIN 4.7 09/21/2018 1231   AST 19 09/21/2018 1231   ALT 13 09/21/2018 1231   ALKPHOS 74 09/21/2018 1231   BILITOT 0.3 09/21/2018 1231   GFRNONAA 68 09/21/2018 1231   GFRAA 78 09/21/2018 1231   Lab Results  Component Value Date   CHOL 312 (H) 09/21/2018   HDL 55 09/21/2018   LDLCALC 219 (H) 09/21/2018   LDLDIRECT 215.3 04/05/2013   TRIG 189 (H) 09/21/2018   CHOLHDL 5.7 (H) 09/21/2018   No results found for: HGBA1C No results found for: VITAMINB12 Lab Results  Component Value Date   TSH 3.600 03/18/2019    11/17/22 MRI brain [I reviewed images myself and agree with interpretation. -VRP]   1. Mild chronic microvascular ischemic changes of the white matter. 2. Moderate parenchymal volume loss.   ASSESSMENT AND PLAN  83 y.o. year old female here with:  Dx:  1. Memory loss   2. Moderate dementia with other behavioral disturbance, unspecified dementia type (HCC)     PLAN:  MODERATE DEMENTIA WITH OTHER BEHAVIOR CHANGES (since ~2024; decline in ADLs; MMSE 17/30) - check lab testing to rule out other causes of memory / cognitive changes - start memantine  10mg  at bedtime; increase to twice a day after 1-2 weeks - try to stay active physically and get some exercise (at least 15-30 minutes per day) - eat a nutritious diet with lean protein, plants / vegetables, whole grains; avoid ultra-processed foods - increase social activities, brain stimulation, games, puzzles, hobbies, crafts, arts, music; try new activities; keep it fun! - aim for at least 7-8 hours sleep per night (or more) - avoid smoking and alcohol - needs help with medications, finances; no driving - safety / supervision issues reviewed - caregiver resources provided (including [email or web address])  Orders Placed This Encounter  Procedures   Vitamin B12   ATN PROFILE   TSH Rfx on Abnormal to Free T4   Autoimmune Neurology Ab   Meds ordered this encounter  Medications   memantine  (NAMENDA ) 10 MG tablet    Sig: Take 1 tablet (10 mg total) by mouth 2 (two) times daily.    Dispense:  60 tablet    Refill:  12   Return for pending if symptoms worsen or fail to improve, pending test results.    EDUARD FABIENE HANLON, MD 02/16/2024, 12:26 PM Certified in Neurology, Neurophysiology and Neuroimaging  [facility] [address], [address] [place], [place] [address] [phone]     [1]  Allergies Allergen Reactions   Isosorbide  Dermatitis and Other (See Comments)    isosorbide    Morphine Anaphylaxis   Phenergan  [Promethazine  Hcl] Swelling   Sulfabenzamide Other (See Comments)   Imdur   [Isosorbide  Dinitrate] Other (See Comments)    hypotension   "

## 2024-02-16 NOTE — Patient Instructions (Addendum)
" °  MODERATE DEMENTIA WITH OTHER BEHAVIOR CHANGES - check lab testing to rule out other causes of memory / cognitive changes - start memantine  10mg  at bedtime; increase to twice a day after 1-2 weeks - try to stay active physically and get some exercise (at least 15-30 minutes per day) - eat a nutritious diet with lean protein, plants / vegetables, whole grains; avoid ultra-processed foods - increase social activities, brain stimulation, games, puzzles, hobbies, crafts, arts, music; try new activities; keep it fun! - aim for at least 7-8 hours sleep per night (or more) - avoid smoking and alcohol - needs help with medications, finances; no driving - safety / supervision issues reviewed - caregiver resources provided (including [email or web address]) "

## 2024-02-17 LAB — TSH RFX ON ABNORMAL TO FREE T4: TSH: 2.29 u[IU]/mL (ref 0.450–4.500)

## 2024-02-23 ENCOUNTER — Telehealth: Payer: Self-pay | Admitting: Diagnostic Neuroimaging

## 2024-02-23 LAB — AUTOIMMUNE NEUROLOGY AB

## 2024-02-23 LAB — ATN PROFILE
A -- Beta-amyloid 42/40 Ratio: 0.102 — AB
Beta-amyloid 40: 186.29 pg/mL
Beta-amyloid 42: 19.05 pg/mL
N -- NfL, Plasma: 3.75 pg/mL (ref 0.00–9.13)
T -- p-tau181: 1.23 pg/mL — AB (ref 0.00–0.97)

## 2024-02-23 LAB — VITAMIN B12: Vitamin B-12: 853 pg/mL (ref 232–1245)

## 2024-02-23 NOTE — Telephone Encounter (Signed)
 Pt daughter called to request for Pt last offcie notes be sent to Pt PCP .   Fax number is   [phone]

## 2024-03-08 ENCOUNTER — Ambulatory Visit: Payer: Self-pay | Admitting: Diagnostic Neuroimaging

## 2024-03-10 ENCOUNTER — Other Ambulatory Visit: Payer: Self-pay | Admitting: Diagnostic Neuroimaging

## 2024-03-23 ENCOUNTER — Ambulatory Visit: Admitting: Neurology

## 2024-04-11 ENCOUNTER — Ambulatory Visit: Admitting: Obstetrics

## 2024-07-29 ENCOUNTER — Ambulatory Visit (HOSPITAL_BASED_OUTPATIENT_CLINIC_OR_DEPARTMENT_OTHER): Admitting: Cardiovascular Disease

## 2024-08-04 ENCOUNTER — Ambulatory Visit: Admitting: Endocrinology
